# Patient Record
Sex: Male | Born: 1975 | Race: Black or African American | Hispanic: No | Marital: Single | Smoking: Current every day smoker
Health system: Southern US, Community
[De-identification: ages and names within clinical notes are randomized; demographics above are authoritative.]

## PROBLEM LIST (undated history)

## (undated) DIAGNOSIS — K2970 Gastritis, unspecified, without bleeding: Secondary | ICD-10-CM

## (undated) DIAGNOSIS — K029 Dental caries, unspecified: Secondary | ICD-10-CM

## (undated) DIAGNOSIS — B1920 Unspecified viral hepatitis C without hepatic coma: Secondary | ICD-10-CM

## (undated) DIAGNOSIS — L309 Dermatitis, unspecified: Secondary | ICD-10-CM

## (undated) DIAGNOSIS — S43006A Unspecified dislocation of unspecified shoulder joint, initial encounter: Secondary | ICD-10-CM

---

## 2010-11-09 ENCOUNTER — Emergency Department (HOSPITAL_COMMUNITY): Admission: EM | Admit: 2010-11-09 | Discharge: 2010-11-09 | Payer: Self-pay | Admitting: Emergency Medicine

## 2011-05-10 ENCOUNTER — Emergency Department (HOSPITAL_COMMUNITY)
Admission: EM | Admit: 2011-05-10 | Discharge: 2011-05-11 | Disposition: A | Discharge status: Home / Self Care | Payer: Self-pay | Attending: Emergency Medicine | Admitting: Emergency Medicine

## 2011-05-10 DIAGNOSIS — R112 Nausea with vomiting, unspecified: Secondary | ICD-10-CM | POA: Insufficient documentation

## 2011-05-10 DIAGNOSIS — F17200 Nicotine dependence, unspecified, uncomplicated: Secondary | ICD-10-CM | POA: Insufficient documentation

## 2011-05-10 DIAGNOSIS — R109 Unspecified abdominal pain: Secondary | ICD-10-CM | POA: Insufficient documentation

## 2011-05-11 LAB — CBC
Platelets: 319 10*3/uL (ref 150–400)
RBC: 5.37 MIL/uL (ref 4.22–5.81)
RDW: 13.8 % (ref 11.5–15.5)
WBC: 10.9 10*3/uL — ABNORMAL HIGH (ref 4.0–10.5)

## 2011-05-11 LAB — DIFFERENTIAL
Basophils Absolute: 0 10*3/uL (ref 0.0–0.1)
Eosinophils Absolute: 0.1 10*3/uL (ref 0.0–0.7)
Eosinophils Relative: 1 % (ref 0–5)
Neutrophils Relative %: 72 % (ref 43–77)

## 2011-05-11 LAB — URINALYSIS, ROUTINE W REFLEX MICROSCOPIC
Glucose, UA: NEGATIVE mg/dL
Hgb urine dipstick: NEGATIVE
pH: 6.5 (ref 5.0–8.0)

## 2011-05-11 LAB — COMPREHENSIVE METABOLIC PANEL
ALT: 10 U/L (ref 0–53)
AST: 15 U/L (ref 0–37)
Albumin: 4.4 g/dL (ref 3.5–5.2)
Alkaline Phosphatase: 58 U/L (ref 39–117)
BUN: 17 mg/dL (ref 6–23)
GFR calc Af Amer: >60 mL/min (ref >60)
Potassium: 3.3 mEq/L — ABNORMAL LOW (ref 3.5–5.1)
Sodium: 138 mEq/L (ref 135–145)
Total Protein: 8.6 g/dL — ABNORMAL HIGH (ref 6.0–8.3)

## 2011-05-11 LAB — URINE MICROSCOPIC-ADD ON

## 2011-05-12 LAB — URINE CULTURE: Colony Count: NO GROWTH

## 2011-08-13 ENCOUNTER — Emergency Department (HOSPITAL_COMMUNITY)
Admission: EM | Admit: 2011-08-13 | Discharge: 2011-08-13 | Disposition: A | Discharge status: Home / Self Care | Payer: Medicaid Other | Attending: Emergency Medicine | Admitting: Emergency Medicine

## 2011-08-13 DIAGNOSIS — T23019A Burn of unspecified degree of unspecified thumb (nail), initial encounter: Secondary | ICD-10-CM | POA: Insufficient documentation

## 2011-08-13 DIAGNOSIS — M79609 Pain in unspecified limb: Secondary | ICD-10-CM | POA: Insufficient documentation

## 2011-08-13 DIAGNOSIS — X19XXXA Contact with other heat and hot substances, initial encounter: Secondary | ICD-10-CM | POA: Insufficient documentation

## 2011-08-14 ENCOUNTER — Emergency Department (HOSPITAL_COMMUNITY)
Admission: EM | Admit: 2011-08-14 | Discharge: 2011-08-14 | Disposition: A | Discharge status: Home / Self Care | Payer: Medicaid Other | Attending: Emergency Medicine | Admitting: Emergency Medicine

## 2011-08-14 DIAGNOSIS — L089 Local infection of the skin and subcutaneous tissue, unspecified: Secondary | ICD-10-CM | POA: Insufficient documentation

## 2011-08-14 DIAGNOSIS — X19XXXA Contact with other heat and hot substances, initial encounter: Secondary | ICD-10-CM | POA: Insufficient documentation

## 2011-08-14 DIAGNOSIS — F17200 Nicotine dependence, unspecified, uncomplicated: Secondary | ICD-10-CM | POA: Insufficient documentation

## 2011-08-14 DIAGNOSIS — T23119A Burn of first degree of unspecified thumb (nail), initial encounter: Secondary | ICD-10-CM | POA: Insufficient documentation

## 2011-11-20 ENCOUNTER — Encounter: Payer: Self-pay | Admitting: Emergency Medicine

## 2011-11-20 ENCOUNTER — Emergency Department (HOSPITAL_COMMUNITY): Payer: Medicaid Other

## 2011-11-20 ENCOUNTER — Emergency Department (HOSPITAL_COMMUNITY)
Admission: EM | Admit: 2011-11-20 | Discharge: 2011-11-20 | Disposition: A | Discharge status: Home / Self Care | Payer: Medicaid Other | Attending: Emergency Medicine | Admitting: Emergency Medicine

## 2011-11-20 DIAGNOSIS — R112 Nausea with vomiting, unspecified: Secondary | ICD-10-CM | POA: Insufficient documentation

## 2011-11-20 DIAGNOSIS — R10819 Abdominal tenderness, unspecified site: Secondary | ICD-10-CM | POA: Insufficient documentation

## 2011-11-20 DIAGNOSIS — E860 Dehydration: Secondary | ICD-10-CM

## 2011-11-20 DIAGNOSIS — R109 Unspecified abdominal pain: Secondary | ICD-10-CM | POA: Insufficient documentation

## 2011-11-20 LAB — LACTIC ACID, PLASMA: Lactic Acid, Venous: 0.8 mmol/L (ref 0.5–2.2)

## 2011-11-20 LAB — CBC
Hemoglobin: 13.7 g/dL (ref 13.0–17.0)
MCH: 26.2 pg (ref 26.0–34.0)
MCV: 78.7 fL (ref 78.0–100.0)
Platelets: 310 10*3/uL (ref 150–400)
RBC: 5.22 MIL/uL (ref 4.22–5.81)

## 2011-11-20 LAB — DIFFERENTIAL
Eosinophils Absolute: 0.1 10*3/uL (ref 0.0–0.7)
Eosinophils Relative: 1 % (ref 0–5)
Lymphs Abs: 2.4 10*3/uL (ref 0.7–4.0)
Monocytes Relative: 13 % — ABNORMAL HIGH (ref 3–12)

## 2011-11-20 LAB — LIPASE, BLOOD: Lipase: 18 U/L (ref 11–59)

## 2011-11-20 LAB — URINALYSIS, ROUTINE W REFLEX MICROSCOPIC
Nitrite: NEGATIVE
Specific Gravity, Urine: 1.029 (ref 1.005–1.030)
Urobilinogen, UA: 0.2 mg/dL (ref 0.0–1.0)

## 2011-11-20 LAB — COMPREHENSIVE METABOLIC PANEL
BUN: 24 mg/dL — ABNORMAL HIGH (ref 6–23)
Calcium: 9.3 mg/dL (ref 8.4–10.5)
Creatinine, Ser: 1.27 mg/dL (ref 0.50–1.35)
GFR calc Af Amer: 84 mL/min — ABNORMAL LOW (ref >90)
Glucose, Bld: 109 mg/dL — ABNORMAL HIGH (ref 70–99)
Total Protein: 7.8 g/dL (ref 6.0–8.3)

## 2011-11-20 LAB — URINE MICROSCOPIC-ADD ON

## 2011-11-20 LAB — POCT I-STAT TROPONIN I: Troponin i, poc: 0.01 ng/mL (ref 0.00–0.08)

## 2011-11-20 MED ORDER — ONDANSETRON HCL 4 MG PO TABS: 4.0000 mg | ORAL_TABLET | Freq: Four times a day (QID) | ORAL | Status: AC

## 2011-11-20 MED ORDER — MORPHINE SULFATE 4 MG/ML IJ SOLN
4.0000 mg | Freq: Once | INTRAMUSCULAR | Status: AC
  Administered 2011-11-20: 4 mg via INTRAVENOUS

## 2011-11-20 MED ORDER — PROMETHAZINE HCL 25 MG PO TABS: 25.0000 mg | ORAL_TABLET | Freq: Four times a day (QID) | ORAL | Status: DC | PRN

## 2011-11-20 MED ORDER — HYDROCODONE-ACETAMINOPHEN 5-500 MG PO TABS: 2.0000 | ORAL_TABLET | Freq: Four times a day (QID) | ORAL | Status: AC | PRN

## 2011-11-20 MED ORDER — POTASSIUM CHLORIDE CRYS ER 20 MEQ PO TBCR
40.0000 meq | EXTENDED_RELEASE_TABLET | Freq: Once | ORAL | Status: AC
  Administered 2011-11-20: 40 meq via ORAL
  Filled 2011-11-20: qty 2

## 2011-11-20 MED ORDER — SODIUM CHLORIDE 0.9 % IV BOLUS (SEPSIS): 2000.0000 mL | Freq: Once | INTRAVENOUS | Status: DC

## 2011-11-20 MED ORDER — ONDANSETRON HCL 4 MG/2ML IJ SOLN
4.0000 mg | Freq: Once | INTRAMUSCULAR | Status: AC
  Administered 2011-11-20: 4 mg via INTRAVENOUS
  Filled 2011-11-20: qty 2

## 2011-11-20 MED ORDER — ONDANSETRON HCL 4 MG/2ML IJ SOLN: 4.0000 mg | Freq: Once | INTRAMUSCULAR | Status: DC

## 2011-11-20 MED ORDER — MORPHINE SULFATE 2 MG/ML IJ SOLN
INTRAMUSCULAR | Status: AC
  Administered 2011-11-20: 4 mg via INTRAVENOUS
  Filled 2011-11-20: qty 2

## 2011-11-20 MED ORDER — SODIUM CHLORIDE 0.9 % IV BOLUS (SEPSIS): 1000.0000 mL | Freq: Once | INTRAVENOUS | Status: DC

## 2011-11-20 NOTE — ED Notes (Signed)
Pt stated that he has been n/v for 3days

## 2011-11-20 NOTE — ED Notes (Signed)
JXB:[id number]<GN> Expected date:11/20/11<BR> Expected time: 8:11 AM<BR> Means of arrival:Ambulance<BR> Comments:<BR> N/V X1 week

## 2011-11-20 NOTE — ED Notes (Signed)
Patient is resting comfortably. And Pt is aware that a urine sample is needed.

## 2011-11-20 NOTE — ED Provider Notes (Signed)
History     CSN: [id number] Arrival date & time: 11/20/2011  8:28 AM   First MD Initiated Contact with Patient 11/20/11 [phone]      Chief Complaint  Patient presents with  . Nausea  . Emesis    (Consider location/radiation/quality/duration/timing/severity/associated sxs/prior treatment) HPI Comments: Epigastric and left upper quadrant tenderness associated with his vomiting. The vomiting began prior to the abdominal symptoms. Hasn't had a bowel movement over a week. There is no hematemesis. Has some substernal chest burning after vomiting.  No sob.  No known ill contacts  Patient is a 35 y.o. male presenting with vomiting. The history is provided by the patient. No language interpreter was used.  Emesis  This is a new problem. The current episode started more than 2 days ago (1 week ago). The problem occurs 2 to 4 times per day. The problem has been gradually worsening. The emesis has an appearance of stomach contents. There has been no fever. Associated symptoms include abdominal pain. Pertinent negatives include no cough, no diarrhea, no fever and no headaches.    No past medical history on file.  No past surgical history on file.  No family history on file.  History  Substance Use Topics  . Smoking status: Not on file  . Smokeless tobacco: Not on file  . Alcohol Use: Not on file      Review of Systems  Constitutional: Negative for fever, activity change, appetite change and fatigue.  HENT: Negative for congestion, sore throat, rhinorrhea, neck pain and neck stiffness.   Respiratory: Negative for cough, shortness of breath and wheezing.   Cardiovascular: Negative for chest pain and palpitations.  Gastrointestinal: Positive for nausea, vomiting, abdominal pain and constipation. Negative for diarrhea and blood in stool.  Genitourinary: Negative for dysuria, urgency, frequency, flank pain and testicular pain.  Neurological: Negative for dizziness, weakness, light-headedness,  numbness and headaches.  All other systems reviewed and are negative.    Allergies  Review of patient's allergies indicates no known allergies.  Home Medications   Current Outpatient Rx  Name Route Sig Dispense Refill  . CYCLOBENZAPRINE HCL 10 MG PO TABS Oral Take 10 mg by mouth at bedtime.      . IBUPROFEN 800 MG PO TABS Oral Take 800 mg by mouth 3 (three) times daily.      Marland Kitchen HYDROCODONE-ACETAMINOPHEN 5-500 MG PO TABS Oral Take 2 tablets by mouth every 6 (six) hours as needed for pain.  10 tablet 0  . ONDANSETRON HCL 4 MG PO TABS Oral Take 1 tablet (4 mg total) by mouth every 6 (six) hours. 12 tablet 0  . PROMETHAZINE HCL 25 MG PO TABS Oral Take 1 tablet (25 mg total) by mouth every 6 (six) hours as needed for nausea. 20 tablet 0    BP 117/73  Pulse 72  Temp(Src) 97.6 F (36.4 C) (Oral)  Resp 16  Ht 6\' 4"  (1.93 m)  Wt 198 lb (89.812 kg)  BMI 24.10 kg/m2  SpO2 99%  Physical Exam  Nursing note and vitals reviewed. Constitutional: He is oriented to person, place, and time. He appears well-developed and well-nourished. No distress.  HENT:  Head: Normocephalic and atraumatic.  Mouth/Throat: Oropharynx is clear and moist.  Eyes: Conjunctivae and EOM are normal. Pupils are equal, round, and reactive to light.  Neck: Normal range of motion. Neck supple.  Cardiovascular: Normal rate, regular rhythm, normal heart sounds and intact distal pulses.  Exam reveals no gallop and no friction rub.  No murmur heard. Pulmonary/Chest: Effort normal and breath sounds normal. No respiratory distress.  Abdominal: Soft. Bowel sounds are normal. He exhibits no distension. There is tenderness (mild diffuse abdominal pain). There is no rebound and no guarding.  Musculoskeletal: Normal range of motion. He exhibits no tenderness.  Neurological: He is alert and oriented to person, place, and time.  Skin: Skin is warm and dry. No rash noted.    ED Course  Procedures (including critical care  time)   Date: 11/20/2011  Rate: 81  Rhythm: normal sinus rhythm  QRS Axis: normal  Intervals: normal  ST/T Wave abnormalities: nonspecific T wave changes  Conduction Disutrbances:none  Narrative Interpretation:   Old EKG Reviewed: none available  Labs Reviewed  DIFFERENTIAL - Abnormal; Notable for the following:    Monocytes Relative 13 (*)    Monocytes Absolute 1.4 (*)    All other components within normal limits  COMPREHENSIVE METABOLIC PANEL - Abnormal; Notable for the following:    Potassium 3.4 (*)    Glucose, Bld 109 (*)    BUN 24 (*)    GFR calc non Af Amer 72 (*)    GFR calc Af Amer 84 (*)    All other components within normal limits  URINALYSIS, ROUTINE W REFLEX MICROSCOPIC - Abnormal; Notable for the following:    Ketones, ur 40 (*)    Protein, ur 30 (*)    All other components within normal limits  CBC  LIPASE, BLOOD  LACTIC ACID, PLASMA  POCT I-STAT TROPONIN I  URINE MICROSCOPIC-ADD ON  I-STAT TROPONIN I   Dg Abd Acute W/chest  11/20/2011  *RADIOLOGY REPORT*  Clinical Data: Abdominal pain, nausea, vomiting  ACUTE ABDOMEN SERIES (ABDOMEN 2 VIEW & CHEST 1 VIEW)  Comparison: None.  Findings: Normal heart size and vascularity.  No focal pneumonia, collapse, consolidation, edema, effusion or pneumothorax.  Trachea midline.  Scattered air and stool throughout the bowel.  No obstruction or ileus.  No free air evident.  Left hemi pelvis calcification likely venous phlebolith.  IMPRESSION: No acute finding by plain radiography  Original Report Authenticated By: Judie Petit. Ruel Favors, M.D.     1. Nausea and vomiting   2. Dehydration       MDM  Dehydration a urinalysis. Potassium was replaced. The remainder of his laboratory studies and imaging was relatively unremarkable. His abdominal pain is mild there is no indication for CT scan imaging at this time. Symptoms improved after IV fluids antiemetics and pain medication. He'll be discharged home with antiemetics as well as  pain medication. He is instructed to followup with her primary care physician next week.  I explained that this could be an ongoing issue however should resolve in a few days. I encouraged aggressive oral rehydration at home.  Provided signs and sx for which to return        Dayton Bailiff, MD 11/20/11 [phone]

## 2011-11-23 ENCOUNTER — Emergency Department (HOSPITAL_COMMUNITY)
Admission: EM | Admit: 2011-11-23 | Discharge: 2011-11-24 | Disposition: A | Discharge status: Home / Self Care | Payer: Medicaid Other | Attending: Emergency Medicine | Admitting: Emergency Medicine

## 2011-11-23 ENCOUNTER — Encounter (HOSPITAL_COMMUNITY): Payer: Self-pay | Admitting: *Deleted

## 2011-11-23 DIAGNOSIS — K219 Gastro-esophageal reflux disease without esophagitis: Secondary | ICD-10-CM | POA: Insufficient documentation

## 2011-11-23 DIAGNOSIS — Z8619 Personal history of other infectious and parasitic diseases: Secondary | ICD-10-CM | POA: Insufficient documentation

## 2011-11-23 DIAGNOSIS — E876 Hypokalemia: Secondary | ICD-10-CM | POA: Insufficient documentation

## 2011-11-23 DIAGNOSIS — R1013 Epigastric pain: Secondary | ICD-10-CM

## 2011-11-23 DIAGNOSIS — R1110 Vomiting, unspecified: Secondary | ICD-10-CM

## 2011-11-23 HISTORY — DX: Unspecified dislocation of unspecified shoulder joint, initial encounter: S43.006A

## 2011-11-23 HISTORY — DX: Unspecified viral hepatitis C without hepatic coma: B19.20

## 2011-11-23 HISTORY — DX: Gastritis, unspecified, without bleeding: K29.70

## 2011-11-23 HISTORY — DX: Dermatitis, unspecified: L30.9

## 2011-11-23 LAB — URINALYSIS, ROUTINE W REFLEX MICROSCOPIC
Glucose, UA: NEGATIVE mg/dL
Hgb urine dipstick: NEGATIVE
Ketones, ur: 40 mg/dL — AB
Leukocytes, UA: NEGATIVE
Nitrite: NEGATIVE
Protein, ur: 30 mg/dL — AB
Specific Gravity, Urine: 1.034 — ABNORMAL HIGH (ref 1.005–1.030)
Urobilinogen, UA: 1 mg/dL (ref 0.0–1.0)
pH: 7 (ref 5.0–8.0)

## 2011-11-23 LAB — POCT I-STAT, CHEM 8
BUN: 27 mg/dL — ABNORMAL HIGH (ref 6–23)
Calcium, Ion: 1.07 mmol/L — ABNORMAL LOW (ref 1.12–1.32)
HCT: 44 % (ref 39.0–52.0)
Hemoglobin: 15 g/dL (ref 13.0–17.0)
Sodium: 137 mEq/L (ref 135–145)
TCO2: 28 mmol/L (ref 0–100)

## 2011-11-23 LAB — URINE MICROSCOPIC-ADD ON

## 2011-11-23 MED ORDER — MORPHINE SULFATE 2 MG/ML IJ SOLN
INTRAMUSCULAR | Status: AC
  Administered 2011-11-23: 4 mg via INTRAVENOUS
  Filled 2011-11-23: qty 2

## 2011-11-23 MED ORDER — MORPHINE SULFATE 4 MG/ML IJ SOLN: 4.0000 mg | Freq: Once | INTRAMUSCULAR | Status: AC

## 2011-11-23 MED ORDER — PANTOPRAZOLE SODIUM 20 MG PO TBEC: 40.0000 mg | DELAYED_RELEASE_TABLET | Freq: Every day | ORAL | Status: DC

## 2011-11-23 MED ORDER — PROMETHAZINE HCL 25 MG RE SUPP: 25.0000 mg | Freq: Four times a day (QID) | RECTAL | Status: DC | PRN

## 2011-11-23 MED ORDER — POTASSIUM CHLORIDE CRYS ER 20 MEQ PO TBCR
20.0000 meq | EXTENDED_RELEASE_TABLET | Freq: Once | ORAL | Status: AC
  Administered 2011-11-24: 20 meq via ORAL
  Filled 2011-11-23: qty 1

## 2011-11-23 MED ORDER — OXYCODONE-ACETAMINOPHEN 5-325 MG PO TABS: 1.0000 | ORAL_TABLET | ORAL | Status: AC | PRN

## 2011-11-23 MED ORDER — SODIUM CHLORIDE 0.9 % IV BOLUS (SEPSIS)
1000.0000 mL | Freq: Once | INTRAVENOUS | Status: AC
  Administered 2011-11-23: 1000 mL via INTRAVENOUS

## 2011-11-23 MED ORDER — ONDANSETRON HCL 4 MG/2ML IJ SOLN
4.0000 mg | Freq: Once | INTRAMUSCULAR | Status: AC
  Administered 2011-11-23: 4 mg via INTRAVENOUS
  Filled 2011-11-23: qty 2

## 2011-11-23 MED ORDER — PANTOPRAZOLE SODIUM 40 MG IV SOLR
40.0000 mg | Freq: Once | INTRAVENOUS | Status: AC
  Administered 2011-11-24: 40 mg via INTRAVENOUS
  Filled 2011-11-23 (×2): qty 40

## 2011-11-23 NOTE — ED Provider Notes (Signed)
Pt well appearing Denies CP/SOB abd soft, nontender  Joya Gaskins, MD 11/23/11 2114

## 2011-11-23 NOTE — ED Notes (Signed)
Pt reports that he states that he came to the ER for nausea and vomiting, was prescribed vicodin, zofran, phenergan. States that he started vomiting again on Sunday. Came to the ER tonight for further evaluation. Pt does have a history of Hepatitis C but does not have a PCP. Pt reports being weak and nauseated at this time. Pt denies abdominal pain at this time.

## 2011-11-23 NOTE — ED Provider Notes (Signed)
History     CSN: [id number] Arrival date & time: 11/23/2011  7:18 PM   First MD Initiated Contact with Patient 11/23/11 2015      Chief Complaint  Patient presents with  . Emesis   HPI: Patient is a 35 y.o. male presenting with vomiting. The history is provided by the patient.  Emesis  This is a recurrent problem. The current episode started more than 2 days ago. The problem occurs 5 to 10 times per day. The problem has not changed since onset.The emesis has an appearance of stomach contents. There has been no fever. Associated symptoms include abdominal pain. Pertinent negatives include no cough and no diarrhea. Risk factors include ill contacts.  Patient reports persistent nausea and vomiting and abdominal pain times approximately one week. Was seen here Sunday, 11/20/2011 for same symptoms. Workup was without acute findings. Discharged home with medication for pain and nausea. Admits to approximately 20 hours of no vomiting. Vomiting then returned and has persisted. Has been unable to keep down food, fluid or medication.  Past Medical History  Diagnosis Date  . Hepatitis C   . Gastritis   . Shoulder dislocation   . Eczema     History reviewed. No pertinent past surgical history.  Family History  Problem Relation Age of Onset  . Rheum arthritis Mother   . Osteoarthritis Mother   . Hypertension Father   . Migraines Sister     History  Substance Use Topics  . Smoking status: Current Everyday Smoker -- 3.0 packs/day for 15 years    Types: Cigarettes  . Smokeless tobacco: Former Neurosurgeon    Quit date: 11/22/2009  . Alcohol Use: Yes     a beer a month ago      Review of Systems  Constitutional: Negative.   HENT: Negative.   Eyes: Negative.   Respiratory: Negative.  Negative for cough.   Cardiovascular: Negative.   Gastrointestinal: Positive for vomiting and abdominal pain. Negative for diarrhea.  Genitourinary: Negative.   Musculoskeletal: Negative.   Skin: Negative.     Neurological: Negative.   Hematological: Negative.   Psychiatric/Behavioral: Negative.     Allergies  Review of patient's allergies indicates no known allergies.  Home Medications   Current Outpatient Rx  Name Route Sig Dispense Refill  . CYCLOBENZAPRINE HCL 10 MG PO TABS Oral Take 10 mg by mouth at bedtime.      Marland Kitchen HYDROCODONE-ACETAMINOPHEN 5-500 MG PO TABS Oral Take 2 tablets by mouth every 6 (six) hours as needed for pain.  10 tablet 0  . IBUPROFEN 800 MG PO TABS Oral Take 800 mg by mouth 3 (three) times daily.      Marland Kitchen ONDANSETRON HCL 4 MG PO TABS Oral Take 1 tablet (4 mg total) by mouth every 6 (six) hours. 12 tablet 0  . PROMETHAZINE HCL 25 MG PO TABS Oral Take 1 tablet (25 mg total) by mouth every 6 (six) hours as needed for nausea. 20 tablet 0    BP 101/74  Pulse 91  Temp(Src) 97.9 F (36.6 C) (Oral)  Resp 18  SpO2 100%  Physical Exam  Constitutional: He is oriented to person, place, and time. He appears well-developed and well-nourished.  HENT:  Head: Normocephalic and atraumatic.  Eyes: Conjunctivae are normal.  Neck: Neck supple.  Cardiovascular: Normal rate and regular rhythm.   Pulmonary/Chest: Effort normal and breath sounds normal.  Abdominal: Soft. Bowel sounds are normal.    Neurological: He is alert and oriented to person,  place, and time.  Skin: Skin is warm and dry.  Psychiatric: He has a normal mood and affect.    ED Course  Procedures Pt reports much improved after IVF's and medication for pain and nausea. No further vomiting. Will give IV Protonix and potassium prior to d/c. Will plan for d/c home w/ Phenergan suppositories and short course of medication for pain. Will start pt on PPI for relux like symptoms and encourage continued efforts at getting established w/ a PCP. Pt agreeable w/ plan.   Labs Reviewed  I-STAT, CHEM 8  URINALYSIS, ROUTINE W REFLEX MICROSCOPIC   No results found.   No diagnosis found.    MDM  HPI, PE and findings  c/w reflux.        Leanne Chang, NP 11/23/11 [phone]

## 2011-11-23 NOTE — ED Provider Notes (Signed)
Medical screening examination/treatment/procedure(s) were conducted as a shared visit with non-physician practitioner(s) and myself.  I personally evaluated the patient during the encounter   Joya Gaskins, MD 11/23/11 [phone]

## 2011-11-24 NOTE — ED Notes (Signed)
Vital signs stable. 

## 2012-01-31 ENCOUNTER — Encounter (HOSPITAL_COMMUNITY): Payer: Self-pay | Admitting: *Deleted

## 2012-01-31 ENCOUNTER — Emergency Department (HOSPITAL_COMMUNITY): Payer: Self-pay

## 2012-01-31 ENCOUNTER — Emergency Department (HOSPITAL_COMMUNITY)
Admission: EM | Admit: 2012-01-31 | Discharge: 2012-01-31 | Disposition: A | Discharge status: Home / Self Care | Payer: Self-pay | Attending: Emergency Medicine | Admitting: Emergency Medicine

## 2012-01-31 DIAGNOSIS — Z8619 Personal history of other infectious and parasitic diseases: Secondary | ICD-10-CM | POA: Insufficient documentation

## 2012-01-31 DIAGNOSIS — M25519 Pain in unspecified shoulder: Secondary | ICD-10-CM | POA: Insufficient documentation

## 2012-01-31 DIAGNOSIS — M2669 Other specified disorders of temporomandibular joint: Secondary | ICD-10-CM | POA: Insufficient documentation

## 2012-01-31 DIAGNOSIS — R062 Wheezing: Secondary | ICD-10-CM | POA: Insufficient documentation

## 2012-01-31 DIAGNOSIS — R6884 Jaw pain: Secondary | ICD-10-CM | POA: Insufficient documentation

## 2012-01-31 MED ORDER — HYDROCODONE-ACETAMINOPHEN 5-325 MG PO TABS: 1.0000 | ORAL_TABLET | Freq: Four times a day (QID) | ORAL | Status: AC | PRN

## 2012-01-31 MED ORDER — IBUPROFEN 800 MG PO TABS: 800.0000 mg | ORAL_TABLET | Freq: Three times a day (TID) | ORAL | Status: AC | PRN
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
## 2012-01-31 NOTE — ED Provider Notes (Signed)
Medical screening examination/treatment/procedure(s) were performed by non-physician practitioner and as supervising physician I was immediately available for consultation/collaboration. Staphanie Harbison, MD, FACEP   Liyana Suniga L Kerstin Crusoe, MD 01/31/12 1855 
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
## 2012-01-31 NOTE — ED Notes (Signed)
Patient transported to X-ray 

## 2014-06-12 ENCOUNTER — Emergency Department (HOSPITAL_COMMUNITY)
Admission: EM | Admit: 2014-06-12 | Discharge: 2014-06-12 | Disposition: A | Discharge status: Home / Self Care | Payer: Medicaid Other | Attending: Emergency Medicine | Admitting: Emergency Medicine

## 2014-06-12 ENCOUNTER — Encounter (HOSPITAL_COMMUNITY): Payer: Self-pay | Admitting: Emergency Medicine

## 2014-06-12 DIAGNOSIS — K007 Teething syndrome: Secondary | ICD-10-CM | POA: Insufficient documentation

## 2014-06-12 DIAGNOSIS — Z8619 Personal history of other infectious and parasitic diseases: Secondary | ICD-10-CM | POA: Insufficient documentation

## 2014-06-12 DIAGNOSIS — F17200 Nicotine dependence, unspecified, uncomplicated: Secondary | ICD-10-CM | POA: Insufficient documentation

## 2014-06-12 DIAGNOSIS — R51 Headache: Secondary | ICD-10-CM | POA: Insufficient documentation

## 2014-06-12 DIAGNOSIS — K029 Dental caries, unspecified: Secondary | ICD-10-CM | POA: Insufficient documentation

## 2014-06-12 DIAGNOSIS — Z872 Personal history of diseases of the skin and subcutaneous tissue: Secondary | ICD-10-CM | POA: Insufficient documentation

## 2014-06-12 DIAGNOSIS — Z87828 Personal history of other (healed) physical injury and trauma: Secondary | ICD-10-CM | POA: Insufficient documentation

## 2014-06-12 DIAGNOSIS — K089 Disorder of teeth and supporting structures, unspecified: Secondary | ICD-10-CM | POA: Insufficient documentation

## 2014-06-12 DIAGNOSIS — K0381 Cracked tooth: Secondary | ICD-10-CM | POA: Insufficient documentation

## 2014-06-12 DIAGNOSIS — K0889 Other specified disorders of teeth and supporting structures: Secondary | ICD-10-CM

## 2014-06-12 MED ORDER — HYDROCODONE-ACETAMINOPHEN 5-325 MG PO TABS: 1.0000 | ORAL_TABLET | ORAL | Status: DC | PRN

## 2014-06-12 MED ORDER — IBUPROFEN 800 MG PO TABS: 800.0000 mg | ORAL_TABLET | Freq: Three times a day (TID) | ORAL | Status: DC

## 2014-06-12 MED ORDER — PENICILLIN V POTASSIUM 500 MG PO TABS: 500.0000 mg | ORAL_TABLET | Freq: Four times a day (QID) | ORAL | Status: DC

## 2014-06-12 NOTE — ED Provider Notes (Signed)
Medical screening examination/treatment/procedure(s) were performed by non-physician practitioner and as supervising physician I was immediately available for consultation/collaboration.   EKG Interpretation None        Glynn OctaveStephen Rancour, MD 06/12/14 [phone]

## 2014-06-12 NOTE — Discharge Instructions (Signed)
You have a dental injury. Use the resource guide listed below to help you find a dentist if you do not already have one to followup with. It is very important that you get evaluated by a dentist as soon as possible. Call tomorrow to schedule an appointment. Use your pain medication as prescribed and do not operate heavy machinery while on pain medication. Note that your pain medication contains acetaminophen (Tylenol) & its is not reccommended that you use additional acetaminophen (Tylenol) while taking this medication. Take your full course of antibiotics. Read the instructions below.  Eat a soft or liquid diet and rinse your mouth out after meals with warm water. You should see a dentist or return here at once if you have increased swelling, increased pain or uncontrolled bleeding from the site of your injury.   SEEK MEDICAL CARE IF:   You have increased pain not controlled with medicines.   You have swelling around your tooth, in your face or neck.   You have bleeding which starts, continues, or gets worse.   You have a fever >101  If you are unable to open your mouth  RESOURCE GUIDE  Dental Problems  Patients with Medicaid: [facility]                     [facility] [address]                                           [address] Phone:  [phone]                                                  Phone:  [phone]  If unable to pay or uninsured, contact:  [facility] or [facility] to become qualified for the adult dental clinic.  Chronic Pain Problems Contact [facility]  [phone] Patients need to be referred by their primary care doctor.  Insufficient Money for Medicine Contact [employer]:  call "211" or [facility] 271-5999.  No Primary Care Doctor Call [facility]  832-8000 Other agencies that provide inexpensive medical care    [facility]  832-8035    [facility]  832-7272    [facility]  [phone]    Women[facility] Clinic  832-4777    [facility]  [phone]    [facility]  [phone]  Psychological Services [facility]  [phone] [facility]  [phone] [facility]   800 853-5163 (emergency services 641-4993)  Substance Abuse Resources [facility]  336-882-2125 [facility] 336-784-9470 [facility] 336-285-9073 [facility] 336-845-3988 Residential & Outpatient Substance Abuse Program  800-659-3381  Abuse/Neglect [place] Child Abuse Hotline (336) 641-3795 [place] Child Abuse Hotline 800-378-5315 (After Hours)  Emergency Shelter  [facility] (336) 271-5985  [facility] (336) 275-9566 [facility] (704) 372-4663  MRSA Hotline #:   832-7006    [place] Resources  [facility] [place]     [employer]                            [facility] [address] [place]                       [address]      [address]  [place]                                                [place]                            [place] Phone:  [phone]                                   Phone:  [phone]                 Phone:  [phone]  [facility] Phone:  [phone]  [place] Child Abuse Hotline (336) 342-1394 (336) 342-3537 (After Hours)    

## 2014-06-12 NOTE — ED Provider Notes (Signed)
CSN: [id number]     Arrival date & time 06/12/14  0930 History   First MD Initiated Contact with Patient 06/12/14 1003     Chief Complaint  Patient presents with  . Dental Pain     (Consider location/radiation/quality/duration/timing/severity/associated sxs/prior Treatment) Patient is a 38 y.o. male presenting with tooth pain. The history is provided by the patient. No language interpreter was used.  Dental Pain Location:  Lower Lower teeth location:  18/LL 2nd molar and 20/LL 2nd bicuspid Quality:  Sharp, dull and constant Severity:  Moderate Onset quality:  Gradual Duration:  2 days Timing:  Constant Progression:  Worsening Chronicity:  Recurrent Context: dental caries, dental fracture and poor dentition   Context: not abscess, not malocclusion, not recent dental surgery and not trauma   Worsened by:  Touching, jaw movement and cold food/drink Ineffective treatments:  Acetaminophen Associated symptoms: facial pain and headaches   Associated symptoms: no facial swelling, no fever and no neck swelling   Risk factors: lack of dental care and smoking     Past Medical History  Diagnosis Date  . Hepatitis C   . Gastritis   . Shoulder dislocation   . Eczema    No past surgical history on file. Family History  Problem Relation Age of Onset  . Rheum arthritis Mother   . Osteoarthritis Mother   . Hypertension Father   . Migraines Sister    History  Substance Use Topics  . Smoking status: Current Every Day Smoker -- 3.00 packs/day for 15 years    Types: Cigarettes  . Smokeless tobacco: Former NeurosurgeonUser    Quit date: 11/22/2009  . Alcohol Use: Yes     Comment: a beer a month ago    Review of Systems  Constitutional: Negative for fever, chills and diaphoresis.  HENT: Negative for facial swelling and voice change.   Neurological: Positive for headaches.      Allergies  Review of patient's allergies indicates no known allergies.  Home Medications   Prior to Admission  medications   Medication Sig Start Date End Date Taking? Authorizing Provider  ibuprofen (ADVIL,MOTRIN) 800 MG tablet Take 800 mg by mouth 3 (three) times daily as needed. For pain    Historical Provider, MD   BP 141/84  Pulse 87  Temp(Src) 98.1 F (36.7 C) (Oral)  Resp 16  SpO2 97% Physical Exam  Nursing note and vitals reviewed. Constitutional: He is oriented to person, place, and time. He appears well-developed and well-nourished.  Non-toxic appearance. He does not have a sickly appearance. He does not appear ill. No distress.  HENT:  Head: Normocephalic and atraumatic.  Mouth/Throat: Oropharynx is clear and moist. No trismus in the jaw. Abnormal dentition. Dental caries present. No dental abscesses. No oropharyngeal exudate.  Tenderness to palpation of the base of tooth #18, #17 No signs of peritonsillar or tonsillar abscess. Multiple missing and partially missing teeth throughout. No signs of gingival abscess. Oropharynx is clear and without exudates. Soft non-tender sublingual mucosa, no tongue elevation, no edema to sublingual space, normal voice. Airway patent.   Eyes: EOM are normal. Pupils are equal, round, and reactive to light.  Neck: Normal range of motion. Neck supple.  Pulmonary/Chest: Effort normal. No respiratory distress.  Musculoskeletal: Normal range of motion.  Lymphadenopathy:    He has no cervical adenopathy.  Neurological: He is alert and oriented to person, place, and time.  Skin: Skin is warm and dry. He is not diaphoretic.  Psychiatric: He has a normal  mood and affect. His behavior is normal.    ED Course  Procedures (including critical care time)   MDM   Final diagnoses:  Pain, dental   Patient with toothache.  No gross abscess.  Exam unconcerning for Ludwig's angina or spread of infection.  Will treat with penicillin and pain medicine.  Urged patient to follow-up with dentist.    Meds given in ED:  Medications - No data to display  New  Prescriptions   HYDROCODONE-ACETAMINOPHEN (NORCO/VICODIN) 5-325 MG PER TABLET    Take 1 tablet by mouth every 4 (four) hours as needed for moderate pain or severe pain.   IBUPROFEN (ADVIL,MOTRIN) 800 MG TABLET    Take 1 tablet (800 mg total) by mouth 3 (three) times daily with meals.   PENICILLIN V POTASSIUM (VEETID) 500 MG TABLET    Take 1 tablet (500 mg total) by mouth 4 (four) times daily.       Clabe SealLauren M Parker, PA-C 06/12/14 [phone]

## 2014-06-12 NOTE — ED Notes (Signed)
Pt c/o toothache x 2 days 

## 2016-01-20 ENCOUNTER — Encounter (HOSPITAL_COMMUNITY): Payer: Self-pay

## 2016-01-20 ENCOUNTER — Emergency Department (HOSPITAL_COMMUNITY)
Admission: EM | Admit: 2016-01-20 | Discharge: 2016-01-20 | Disposition: A | Discharge status: Home / Self Care | Payer: Medicaid Other | Attending: Emergency Medicine | Admitting: Emergency Medicine

## 2016-01-20 DIAGNOSIS — Z87828 Personal history of other (healed) physical injury and trauma: Secondary | ICD-10-CM | POA: Insufficient documentation

## 2016-01-20 DIAGNOSIS — K002 Abnormalities of size and form of teeth: Secondary | ICD-10-CM | POA: Insufficient documentation

## 2016-01-20 DIAGNOSIS — R1013 Epigastric pain: Secondary | ICD-10-CM

## 2016-01-20 DIAGNOSIS — Z872 Personal history of diseases of the skin and subcutaneous tissue: Secondary | ICD-10-CM | POA: Insufficient documentation

## 2016-01-20 DIAGNOSIS — K219 Gastro-esophageal reflux disease without esophagitis: Secondary | ICD-10-CM

## 2016-01-20 DIAGNOSIS — F17210 Nicotine dependence, cigarettes, uncomplicated: Secondary | ICD-10-CM | POA: Insufficient documentation

## 2016-01-20 DIAGNOSIS — Z79899 Other long term (current) drug therapy: Secondary | ICD-10-CM | POA: Insufficient documentation

## 2016-01-20 DIAGNOSIS — Z791 Long term (current) use of non-steroidal anti-inflammatories (NSAID): Secondary | ICD-10-CM | POA: Insufficient documentation

## 2016-01-20 DIAGNOSIS — K029 Dental caries, unspecified: Secondary | ICD-10-CM

## 2016-01-20 DIAGNOSIS — Z8619 Personal history of other infectious and parasitic diseases: Secondary | ICD-10-CM | POA: Insufficient documentation

## 2016-01-20 HISTORY — DX: Dental caries, unspecified: K02.9

## 2016-01-20 MED ORDER — RANITIDINE HCL 150 MG PO TABS: 150.0000 mg | ORAL_TABLET | Freq: Every day | ORAL | Status: DC

## 2016-01-20 MED ORDER — PENICILLIN V POTASSIUM 500 MG PO TABS: 500.0000 mg | ORAL_TABLET | Freq: Three times a day (TID) | ORAL | Status: DC

## 2016-01-20 MED ORDER — GI COCKTAIL ~~LOC~~
30.0000 mL | Freq: Once | ORAL | Status: AC
  Administered 2016-01-20: 30 mL via ORAL
  Filled 2016-01-20: qty 30

## 2016-01-20 NOTE — ED Notes (Signed)
ED PA at bedside

## 2016-01-20 NOTE — Discharge Instructions (Signed)
Continue to take Zantac and avoid food which may upset your stomach.  Follow up with a dentist for dental pain, take antibiotic in the mean time.  Follow up with hand specialist for further evaluation of your left thumb discomfort.    Gastroesophageal Reflux Disease, Adult Normally, food travels down the esophagus and stays in the stomach to be digested. However, when a person has gastroesophageal reflux disease (GERD), food and stomach acid move back up into the esophagus. When this happens, the esophagus becomes sore and inflamed. Over time, GERD can create small holes (ulcers) in the lining of the esophagus.  CAUSES This condition is caused by a problem with the muscle between the esophagus and the stomach (lower esophageal sphincter, or LES). Normally, the LES muscle closes after food passes through the esophagus to the stomach. When the LES is weakened or abnormal, it does not close properly, and that allows food and stomach acid to go back up into the esophagus. The LES can be weakened by certain dietary substances, medicines, and medical conditions, including:  Tobacco use.  Pregnancy.  Having a hiatal hernia.  Heavy alcohol use.  Certain foods and beverages, such as coffee, chocolate, onions, and peppermint. RISK FACTORS This condition is more likely to develop in:  People who have an increased body weight.  People who have connective tissue disorders.  People who use NSAID medicines. SYMPTOMS Symptoms of this condition include:  Heartburn.  Difficult or painful swallowing.  The feeling of having a lump in the throat.  Abitter taste in the mouth.  Bad breath.  Having a large amount of saliva.  Having an upset or bloated stomach.  Belching.  Chest pain.  Shortness of breath or wheezing.  Ongoing (chronic) cough or a night-time cough.  Wearing away of tooth enamel.  Weight loss. Different conditions can cause chest pain. Make sure to see your health care  provider if you experience chest pain. DIAGNOSIS Your health care provider will take a medical history and perform a physical exam. To determine if you have mild or severe GERD, your health care provider may also monitor how you respond to treatment. You may also have other tests, including:  An endoscopy toexamine your stomach and esophagus with a small camera.  A test thatmeasures the acidity level in your esophagus.  A test thatmeasures how much pressure is on your esophagus.  A barium swallow or modified barium swallow to show the shape, size, and functioning of your esophagus. TREATMENT The goal of treatment is to help relieve your symptoms and to prevent complications. Treatment for this condition may vary depending on how severe your symptoms are. Your health care provider may recommend:  Changes to your diet.  Medicine.  Surgery. HOME CARE INSTRUCTIONS Diet  Follow a diet as recommended by your health care provider. This may involve avoiding foods and drinks such as:  Coffee and tea (with or without caffeine).  Drinks that containalcohol.  Energy drinks and sports drinks.  Carbonated drinks or sodas.  Chocolate and cocoa.  Peppermint and mint flavorings.  Garlic and onions.  Horseradish.  Spicy and acidic foods, including peppers, chili powder, curry powder, vinegar, hot sauces, and barbecue sauce.  Citrus fruit juices and citrus fruits, such as oranges, lemons, and limes.  Tomato-based foods, such as red sauce, chili, salsa, and pizza with red sauce.  Fried and fatty foods, such as donuts, french fries, potato chips, and high-fat dressings.  High-fat meats, such as hot dogs and fatty cuts  of red and white meats, such as rib eye steak, sausage, ham, and bacon.  High-fat dairy items, such as whole milk, butter, and cream cheese.  Eat small, frequent meals instead of large meals.  Avoid drinking large amounts of liquid with your meals.  Avoid eating  meals during the 2-3 hours before bedtime.  Avoid lying down right after you eat.  Do not exercise right after you eat. General Instructions  Pay attention to any changes in your symptoms.  Take over-the-counter and prescription medicines only as told by your health care provider. Do not take aspirin, ibuprofen, or other NSAIDs unless your health care provider told you to do so.  Do not use any tobacco products, including cigarettes, chewing tobacco, and e-cigarettes. If you need help quitting, ask your health care provider.  Wear loose-fitting clothing. Do not wear anything tight around your waist that causes pressure on your abdomen.  Raise (elevate) the head of your bed 6 inches (15cm).  Try to reduce your stress, such as with yoga or meditation. If you need help reducing stress, ask your health care provider.  If you are overweight, reduce your weight to an amount that is healthy for you. Ask your health care provider for guidance about a safe weight loss goal.  Keep all follow-up visits as told by your health care provider. This is important. SEEK MEDICAL CARE IF:  You have new symptoms.  You have unexplained weight loss.  You have difficulty swallowing, or it hurts to swallow.  You have wheezing or a persistent cough.  Your symptoms do not improve with treatment.  You have a hoarse voice. SEEK IMMEDIATE MEDICAL CARE IF:  You have pain in your arms, neck, jaw, teeth, or back.  You feel sweaty, dizzy, or light-headed.  You have chest pain or shortness of breath.  You vomit and your vomit looks like blood or coffee grounds.  You faint.  Your stool is bloody or black.  You cannot swallow, drink, or eat.   This information is not intended to replace advice given to you by your health care provider. Make sure you discuss any questions you have with your health care provider.   Document Released: 09/15/2005 Document Revised: 08/27/2015 Document Reviewed:  04/02/2015 Elsevier Interactive Patient Education [employer]

## 2016-01-20 NOTE — ED Provider Notes (Signed)
CSN: [id number]     Arrival date & time 01/20/16  4098 History   First MD Initiated Contact with Patient 01/20/16 [phone]     Chief Complaint  Patient presents with  . Dental Pain  . Nausea  . Emesis     (Consider location/radiation/quality/duration/timing/severity/associated sxs/prior Treatment) HPI   40 year old male with history of hepatitis C brought here via EMS presenting for evaluation of nausea and vomiting. Patient report he has a significant history of heartburn. Yesterday after eating a hamburger he developed burning sensation from his epigastric that radiates towards his throat. Sensation has been persistent, worsening with eating spicy food. He ran out of his Zantac yesterday. He reports moderate discomfort from his "heartburn" which has now is causing right side dental pain. He is unable to localize the exact tooth that cause pain, and states that the pain radiated throughout his right lower jaw. Patient complaining of chest tightness. He denies having any fever, headache, productive cough, back pain, dysuria, or rash.   Past Medical History  Diagnosis Date  . Hepatitis C   . Gastritis   . Shoulder dislocation   . Eczema    No past surgical history on file. Family History  Problem Relation Age of Onset  . Rheum arthritis Mother   . Osteoarthritis Mother   . Hypertension Father   . Migraines Sister    Social History  Substance Use Topics  . Smoking status: Current Every Day Smoker -- 3.00 packs/day for 15 years    Types: Cigarettes  . Smokeless tobacco: Former Neurosurgeon    Quit date: 11/22/2009  . Alcohol Use: Yes     Comment: a beer a month ago    Review of Systems  All other systems reviewed and are negative.     Allergies  Review of patient's allergies indicates no known allergies.  Home Medications   Prior to Admission medications   Medication Sig Start Date End Date Taking? Authorizing Provider  HYDROcodone-acetaminophen (NORCO/VICODIN) 5-325 MG per  tablet Take 1 tablet by mouth every 4 (four) hours as needed for moderate pain or severe pain. 06/12/14   Mellody Drown, PA-C  ibuprofen (ADVIL,MOTRIN) 800 MG tablet Take 1 tablet (800 mg total) by mouth 3 (three) times daily with meals. 06/12/14   Mellody Drown, PA-C  penicillin v potassium (VEETID) 500 MG tablet Take 1 tablet (500 mg total) by mouth 4 (four) times daily. 06/12/14   Mellody Drown, PA-C  ranitidine (ZANTAC) 150 MG capsule Take 150 mg by mouth daily.    Historical Provider, MD   SpO2  Physical Exam  Constitutional: He appears well-developed and well-nourished. No distress.  African-American male appears uncomfortable but nontoxic  HENT:  Head: Atraumatic.  Mouth: Poor dentition with dental decay noted to tooth #28, and #30, tender to palpation but no gingival erythema or abscess. No trismus. No TMJ.  Eyes: Conjunctivae are normal.  Neck: Neck supple.  Cardiovascular: Normal rate and regular rhythm.   Pulmonary/Chest: Effort normal and breath sounds normal. He exhibits no tenderness.  Abdominal: Soft. Bowel sounds are normal. He exhibits no distension. There is no tenderness.  Neurological: He is alert.  Skin: No rash noted.  Psychiatric: He has a normal mood and affect.  Nursing note and vitals reviewed.   ED Course  Procedures (including critical care time) Labs Review Labs Reviewed - No data to display  Imaging Review No results found. I have personally reviewed and evaluated these images and lab results as part of my medical  decision-making.   EKG Interpretation None      MDM   Final diagnoses:  Epigastric pain  Gastroesophageal reflux disease, esophagitis presence not specified  Pain due to dental caries    BP 125/81 mmHg  Pulse 64  Temp(Src) 97.7 F (36.5 C) (Oral)  Resp 20  Ht  (1.93 m)  Wt 88.451 kg  BMI 23.75 kg/m2  SpO2 100%   7:34 AM patient with history of GERD here with symptoms suggestive of GERD. Does report dental tenderness  secondary to his current GERD sxs.  Pt will benefit from a GI cocktail.  He will receive a dental referral for his dental pain.    8:20 AM After receiving a GI cocktail, patient felt much better. He is now able to tolerates by mouth. His dental pain also improves. At this time patient request for me to evaluate his left thumb stating that he has had trouble with his joint locking in a flexed position for the past week. Patient states he did hit something against it but is not having significant pain at that time. He denies any acute pain to his thumb or any numbness. He is right-hand dominant. On examination, patient has no focal point tenderness however after he flexes IP joint he is having difficulty straightening it. No crepitus on exam. Normal brisk cap refills. Patient will receive a referral to hand specialist for management.   Fayrene Helper, PA-C 01/20/16 0827  Mancel Bale, MD 01/20/16 2100

## 2016-01-20 NOTE — ED Notes (Signed)
Bed: [facility] Expected date:  Expected time:  Means of arrival:  Comments: EMS 40yo M dental pain

## 2016-01-20 NOTE — ED Notes (Signed)
EDPA BOWIE at bedside. 

## 2016-01-20 NOTE — ED Notes (Signed)
Per [facility]- Pt c/o of dental pain left side x 2 months recent increase over the last 2 days with N/V. Has not vomited today. HX of GERD. Out of zantac. No other complaints

## 2016-01-21 ENCOUNTER — Emergency Department (HOSPITAL_COMMUNITY)
Admission: EM | Admit: 2016-01-21 | Discharge: 2016-01-21 | Disposition: A | Discharge status: Home / Self Care | Payer: Medicaid Other | Attending: Emergency Medicine | Admitting: Emergency Medicine

## 2016-01-21 ENCOUNTER — Encounter (HOSPITAL_COMMUNITY): Payer: Self-pay

## 2016-01-21 DIAGNOSIS — K2970 Gastritis, unspecified, without bleeding: Secondary | ICD-10-CM | POA: Insufficient documentation

## 2016-01-21 DIAGNOSIS — K029 Dental caries, unspecified: Secondary | ICD-10-CM

## 2016-01-21 DIAGNOSIS — F17210 Nicotine dependence, cigarettes, uncomplicated: Secondary | ICD-10-CM | POA: Insufficient documentation

## 2016-01-21 DIAGNOSIS — Z792 Long term (current) use of antibiotics: Secondary | ICD-10-CM | POA: Insufficient documentation

## 2016-01-21 DIAGNOSIS — Z79899 Other long term (current) drug therapy: Secondary | ICD-10-CM | POA: Insufficient documentation

## 2016-01-21 DIAGNOSIS — Z8619 Personal history of other infectious and parasitic diseases: Secondary | ICD-10-CM | POA: Insufficient documentation

## 2016-01-21 DIAGNOSIS — K0381 Cracked tooth: Secondary | ICD-10-CM | POA: Insufficient documentation

## 2016-01-21 DIAGNOSIS — Z872 Personal history of diseases of the skin and subcutaneous tissue: Secondary | ICD-10-CM | POA: Insufficient documentation

## 2016-01-21 LAB — CBC WITH DIFFERENTIAL/PLATELET
Basophils Absolute: 0 10*3/uL (ref 0.0–0.1)
Basophils Relative: 0 %
Eosinophils Absolute: 0.1 10*3/uL (ref 0.0–0.7)
Eosinophils Relative: 1 %
HCT: 38.7 % — ABNORMAL LOW (ref 39.0–52.0)
HEMOGLOBIN: 12.5 g/dL — AB (ref 13.0–17.0)
LYMPHS ABS: 1.7 10*3/uL (ref 0.7–4.0)
LYMPHS PCT: 24 %
MCH: 23.8 pg — AB (ref 26.0–34.0)
MCHC: 32.3 g/dL (ref 30.0–36.0)
MCV: 73.7 fL — AB (ref 78.0–100.0)
Monocytes Absolute: 0.6 10*3/uL (ref 0.1–1.0)
Monocytes Relative: 9 %
NEUTROS ABS: 4.4 10*3/uL (ref 1.7–7.7)
NEUTROS PCT: 66 %
Platelets: 357 10*3/uL (ref 150–400)
RBC: 5.25 MIL/uL (ref 4.22–5.81)
RDW: 16.7 % — ABNORMAL HIGH (ref 11.5–15.5)
WBC: 6.8 10*3/uL (ref 4.0–10.5)

## 2016-01-21 LAB — COMPREHENSIVE METABOLIC PANEL
ALT: 24 U/L (ref 17–63)
AST: 41 U/L (ref 15–41)
Albumin: 4.6 g/dL (ref 3.5–5.0)
Alkaline Phosphatase: 50 U/L (ref 38–126)
Anion gap: 14 (ref 5–15)
BUN: 22 mg/dL — AB (ref 6–20)
CHLORIDE: 97 mmol/L — AB (ref 101–111)
CO2: 28 mmol/L (ref 22–32)
CREATININE: 1.35 mg/dL — AB (ref 0.61–1.24)
Calcium: 9.8 mg/dL (ref 8.9–10.3)
GFR calc Af Amer: >60 mL/min (ref >60)
GFR calc non Af Amer: >60 mL/min (ref >60)
Glucose, Bld: 98 mg/dL (ref 65–99)
POTASSIUM: 3.2 mmol/L — AB (ref 3.5–5.1)
SODIUM: 139 mmol/L (ref 135–145)
Total Bilirubin: 1.5 mg/dL — ABNORMAL HIGH (ref 0.3–1.2)
Total Protein: 8.6 g/dL — ABNORMAL HIGH (ref 6.5–8.1)

## 2016-01-21 LAB — LIPASE, BLOOD: Lipase: 35 U/L (ref 11–51)

## 2016-01-21 MED ORDER — DIPHENHYDRAMINE HCL 50 MG/ML IJ SOLN
25.0000 mg | Freq: Once | INTRAMUSCULAR | Status: AC
  Administered 2016-01-21: 25 mg via INTRAVENOUS
  Filled 2016-01-21: qty 1

## 2016-01-21 MED ORDER — BUPIVACAINE-EPINEPHRINE (PF) 0.5% -1:200000 IJ SOLN
1.8000 mL | Freq: Once | INTRAMUSCULAR | Status: AC
  Administered 2016-01-21: 1.8 mL
  Filled 2016-01-21: qty 1.8

## 2016-01-21 MED ORDER — PROCHLORPERAZINE EDISYLATE 5 MG/ML IJ SOLN
10.0000 mg | Freq: Once | INTRAMUSCULAR | Status: AC
  Administered 2016-01-21: 10 mg via INTRAVENOUS
  Filled 2016-01-21: qty 2

## 2016-01-21 MED ORDER — GI COCKTAIL ~~LOC~~
30.0000 mL | Freq: Once | ORAL | Status: AC
  Administered 2016-01-21: 30 mL via ORAL
  Filled 2016-01-21: qty 30

## 2016-01-21 MED ORDER — ONDANSETRON HCL 4 MG/2ML IJ SOLN
4.0000 mg | Freq: Once | INTRAMUSCULAR | Status: AC
  Administered 2016-01-21: 4 mg via INTRAVENOUS
  Filled 2016-01-21: qty 2

## 2016-01-21 MED ORDER — IBUPROFEN 800 MG PO TABS
800.0000 mg | ORAL_TABLET | Freq: Once | ORAL | Status: AC
  Administered 2016-01-21: 800 mg via ORAL
  Filled 2016-01-21: qty 1

## 2016-01-21 MED ORDER — PROMETHAZINE HCL 25 MG RE SUPP: 25.0000 mg | Freq: Four times a day (QID) | RECTAL | Status: DC | PRN

## 2016-01-21 MED ORDER — SODIUM CHLORIDE 0.9 % IV BOLUS (SEPSIS)
1000.0000 mL | Freq: Once | INTRAVENOUS | Status: AC
Start: 2016-01-21 — End: 2016-01-21
  Administered 2016-01-21: 1000 mL via INTRAVENOUS

## 2016-01-21 NOTE — Discharge Instructions (Signed)
Dental list          updated 1.22.15 These dentists all accept Medicaid.  The list is for your convenience in choosing your child's dentist. Estos dentistas aceptan Medicaid.  La lista es para su conveniencia y es una cortesa.     [facility]     [phone] [address]  [address] [place] [address] Se habla espaol From 1 to 39 years old Parent may go with child Bryan Cobb DDS     [phone] [address] [place]  [address] Se habla espaol From 2 to 13 years old Parent may NOT go with child  [facility]    [phone] [address] [place] [address] Se habla espaol Vietnamese spoken From 2 years old Parent may go with child [facility]     [phone] [address] [place] [address] Se habla espaol From 1 to 20 years old Parent may NOT go with child  Thane Hisaw DDS     [phone] [facility]      [address]  [place] [address] No se habla espaol From teeth coming in Parent may go with child  [facility]     [phone] [address] [place] [address] Requires certification. Call for information. Requiere certificacin. Llame para informacin. Algunos dias se habla espaol  From birth to 20 years Parent possibly goes with child  [facility]al DDS     [phone] [address]  [address] [place] [address] Se habla espaol From 18 months to 18 years  Parent may go with child  [facility]asters DDS    [phone] Eric J. Sadler DDS [address] [place] [address] Se habla espaol From 1 year old Parent may go with child  Perry Jeffries DDS    [phone] [address] [place] [address] Se habla espaol  From 18 months old Parent may go with child J. Selig Cooper DDS    [phone] [address] [place] [address] Se habla espaol From 5 to 26 years old Parent may go with child  [facility]    [phone] [address] [place] [address] No se habla espaol From birth Parent may not go with child     

## 2016-01-21 NOTE — Progress Notes (Signed)
EDCM spoke to patient at bedside. Patient confirms he does not have a pcp or insurance living in [place].  [facility] provided patient with contact information to [facility], informed patient of services there.  EDCM also provided patient with list of pcps who accept self pay patients, list of discount pharmacies and websites needymeds.org and GoodRX.com for medication assistance, phone number to inquire about the orange card, phone number to inquire about Mediciad, phone number to inquire about the Affordable Care Act, financial resources in the community such as local churches, salvation army, urban ministries, and dental assistance for uninsured patients.  Patient thankful for resources.  No further EDCM needs at this time.  Patient agreeable for referral to [facility] for orange card.  Referral placed.

## 2016-01-21 NOTE — ED Notes (Signed)
Pt c/o upper abdominal pain, n/v, and dental pain x "over a week."  Pain score 8/10.  Pt was seen yesterday for same.  Sts he was not prescribed anything for nausea.  Pt's visitor reports "he typically needs a 30 day supple of anti-vomit medicine when this happens."

## 2016-01-21 NOTE — ED Provider Notes (Signed)
CSN: [id number]     Arrival date & time 01/21/16  1759 History   First MD Initiated Contact with Patient 01/21/16 1808     Chief Complaint  Patient presents with  . Abdominal Pain  . Emesis  . Dental Pain     (Consider location/radiation/quality/duration/timing/severity/associated sxs/prior Treatment) Patient is a 40 y.o. male presenting with abdominal pain, vomiting, and tooth pain. The history is provided by the patient.  Abdominal Pain Pain location:  Epigastric Pain quality: burning   Pain radiates to:  Does not radiate Pain severity:  Moderate Onset quality:  Sudden Duration:  2 days Timing:  Constant Progression:  Unchanged Chronicity:  New Relieved by:  Nothing Worsened by:  Nothing tried Ineffective treatments:  None tried Associated symptoms: nausea and vomiting   Associated symptoms: no chest pain, no chills, no diarrhea, no fever and no shortness of breath   Emesis Associated symptoms: no abdominal pain, no arthralgias, no chills, no diarrhea, no headaches and no myalgias   Dental Pain Associated symptoms: no congestion, no facial swelling, no fever and no headaches     40 yo M  With a chief complaint of epigastric abdominal pain nausea and vomiting. Patient has a issue with recurrence of this. States usually is controlled with twice a day Zantac. Patient was seen yesterday with similar symptoms. Was relieved with a GI cocktail. Pain is epigastric radiates up into his chest. Feels like a burning pain. Usually is worse with eating. Denies fevers or chills.  Past Medical History  Diagnosis Date  . Hepatitis C   . Gastritis   . Shoulder dislocation   . Eczema   . Dental caries    History reviewed. No pertinent past surgical history. Family History  Problem Relation Age of Onset  . Rheum arthritis Mother   . Osteoarthritis Mother   . Hypertension Father   . Migraines Sister    Social History  Substance Use Topics  . Smoking status: Current Every Day Smoker  -- 3.00 packs/day for 15 years    Types: Cigarettes  . Smokeless tobacco: Former Neurosurgeon    Quit date: 11/22/2009  . Alcohol Use: Yes     Comment: a beer a month ago    Review of Systems  Constitutional: Negative for fever and chills.  HENT: Negative for congestion and facial swelling.   Eyes: Negative for discharge and visual disturbance.  Respiratory: Negative for shortness of breath.   Cardiovascular: Negative for chest pain and palpitations.  Gastrointestinal: Positive for nausea and vomiting. Negative for abdominal pain and diarrhea.  Musculoskeletal: Negative for myalgias and arthralgias.  Skin: Negative for color change and rash.  Neurological: Negative for tremors, syncope and headaches.  Psychiatric/Behavioral: Negative for confusion and dysphoric mood.      Allergies  Asa  Home Medications   Prior to Admission medications   Medication Sig Start Date End Date Taking? Authorizing Provider  acetaminophen (TYLENOL) 500 MG tablet Take 1,000 mg by mouth every 6 (six) hours as needed for mild pain, moderate pain, fever or headache.    Yes Historical Provider, MD  penicillin v potassium (VEETID) 500 MG tablet Take 1 tablet (500 mg total) by mouth 3 (three) times daily. 01/20/16  Yes Fayrene Helper, PA-C  ranitidine (ZANTAC) 150 MG tablet Take 1 tablet (150 mg total) by mouth daily. 01/20/16  Yes Fayrene Helper, PA-C   BP 142/82 mmHg  Pulse 74  Temp(Src) 98 F (36.7 C) (Oral)  Resp 19  SpO2 98% Physical Exam  Constitutional: He is oriented to person, place, and time. He appears well-developed and well-nourished.  HENT:  Head: Normocephalic and atraumatic.  Mouth/Throat:    Eyes: EOM are normal. Pupils are equal, round, and reactive to light.  Neck: Normal range of motion. Neck supple. No JVD present.  Cardiovascular: Normal rate and regular rhythm.  Exam reveals no gallop and no friction rub.   No murmur heard. Pulmonary/Chest: No respiratory distress. He has no wheezes. He has  no rales.  Abdominal: He exhibits no distension. There is no tenderness. There is no rebound and no guarding.  Musculoskeletal: Normal range of motion.  Neurological: He is alert and oriented to person, place, and time.  Skin: No rash noted. No pallor.  Psychiatric: He has a normal mood and affect. His behavior is normal.  Nursing note and vitals reviewed.   ED Course  .Nerve Block Date/Time: 01/21/2016 8:46 PM Performed by: Melene Plan Authorized by: Melene Plan Consent: Verbal consent obtained. Risks and benefits: risks, benefits and alternatives were discussed Consent given by: patient Required items: required blood products, implants, devices, and special equipment available Patient identity confirmed: verbally with patient Indications: pain relief Body area: face/mouth Nerve: inferior alveolar Laterality: right Patient sedated: no Preparation: Patient was prepped and draped in the usual sterile fashion. Patient position: sitting Needle gauge: 27 G Location technique: anatomical landmarks Local anesthetic: bupivacaine 0.5% with epinephrine Anesthetic total: 10 ml Outcome: pain improved Patient tolerance: Patient tolerated the procedure well with no immediate complications   (including critical care time) Labs Review Labs Reviewed  CBC WITH DIFFERENTIAL/PLATELET - Abnormal; Notable for the following:    Hemoglobin 12.5 (*)    HCT 38.7 (*)    MCV 73.7 (*)    MCH 23.8 (*)    RDW 16.7 (*)    All other components within normal limits  COMPREHENSIVE METABOLIC PANEL - Abnormal; Notable for the following:    Potassium 3.2 (*)    Chloride 97 (*)    BUN 22 (*)    Creatinine, Ser 1.35 (*)    Total Protein 8.6 (*)    Total Bilirubin 1.5 (*)    All other components within normal limits  LIPASE, BLOOD    Imaging Review No results found. I have personally reviewed and evaluated these images and lab results as part of my medical decision-making.   EKG Interpretation None        MDM   Final diagnoses:  None    40 yo M  With a chief complaint of dental pain as well as nausea vomiting and epigastric abdominal pain. Patient has had recurrent episodes of this. Was actually seen here yesterday improved with a GI cocktail will sent home. Patient had worsening of his symptoms at home as he said he has no medicine for nausea there. Vomited all throughout the day and having worsening epigastric abdominal pain. Patient was dental block at bedside. Had significant improvement of his dental pain. GI cocktail Compazine and Zofran were given with improvement of his nausea. Able to tolerate by mouth. Discharged home.  8:48 PM:  I have discussed the diagnosis/risks/treatment options with the patient and family and believe the pt to be eligible for discharge home to follow-up with PCP, GI. We also discussed returning to the ED immediately if new or worsening sx occur. We discussed the sx which are most concerning (e.g., sudden worsening pain, fever, inability to tolerate by mouth) that necessitate immediate return. Medications administered to the patient during their visit and  any new prescriptions provided to the patient are listed below.  Medications given during this visit Medications  sodium chloride 0.9 % bolus 1,000 mL (0 mLs Intravenous Stopped 01/21/16 1949)  ondansetron (ZOFRAN) injection 4 mg (4 mg Intravenous Given 01/21/16 1854)  gi cocktail (Maalox,Lidocaine,Donnatal) (30 mLs Oral Given 01/21/16 1834)  bupivacaine-epinephrine (MARCAINE W/ EPI) 0.5% -1:200000 injection 1.8 mL (1.8 mLs Infiltration Given 01/21/16 1950)  ibuprofen (ADVIL,MOTRIN) tablet 800 mg (800 mg Oral Given 01/21/16 1958)  prochlorperazine (COMPAZINE) injection 10 mg (10 mg Intravenous Given 01/21/16 1958)  diphenhydrAMINE (BENADRYL) injection 25 mg (25 mg Intravenous Given 01/21/16 1958)    New Prescriptions   No medications on file    The patient appears reasonably screen and/or stabilized for discharge and  I doubt any other medical condition or other [facility] requiring further screening, evaluation, or treatment in the ED at this time prior to discharge.      Melene Plan, DO 01/21/16 2048

## 2016-01-30 ENCOUNTER — Emergency Department (HOSPITAL_COMMUNITY)
Admission: EM | Admit: 2016-01-30 | Discharge: 2016-01-30 | Disposition: A | Discharge status: Home / Self Care | Payer: Medicaid Other | Attending: Emergency Medicine | Admitting: Emergency Medicine

## 2016-01-30 ENCOUNTER — Encounter (HOSPITAL_COMMUNITY): Payer: Self-pay | Admitting: Emergency Medicine

## 2016-01-30 DIAGNOSIS — R112 Nausea with vomiting, unspecified: Secondary | ICD-10-CM | POA: Insufficient documentation

## 2016-01-30 DIAGNOSIS — F17210 Nicotine dependence, cigarettes, uncomplicated: Secondary | ICD-10-CM | POA: Insufficient documentation

## 2016-01-30 DIAGNOSIS — R109 Unspecified abdominal pain: Secondary | ICD-10-CM | POA: Insufficient documentation

## 2016-01-30 LAB — COMPREHENSIVE METABOLIC PANEL
ALK PHOS: 54 U/L (ref 38–126)
ALT: 21 U/L (ref 17–63)
AST: 25 U/L (ref 15–41)
Albumin: 4.6 g/dL (ref 3.5–5.0)
Anion gap: 9 (ref 5–15)
BUN: 13 mg/dL (ref 6–20)
CO2: 31 mmol/L (ref 22–32)
Calcium: 9.6 mg/dL (ref 8.9–10.3)
Chloride: 102 mmol/L (ref 101–111)
Creatinine, Ser: 1.24 mg/dL (ref 0.61–1.24)
Glucose, Bld: 102 mg/dL — ABNORMAL HIGH (ref 65–99)
Potassium: 3.8 mmol/L (ref 3.5–5.1)
Sodium: 142 mmol/L (ref 135–145)
TOTAL PROTEIN: 8.7 g/dL — AB (ref 6.5–8.1)
Total Bilirubin: 1 mg/dL (ref 0.3–1.2)

## 2016-01-30 LAB — CBC
HEMATOCRIT: 37.6 % — AB (ref 39.0–52.0)
HEMOGLOBIN: 11.9 g/dL — AB (ref 13.0–17.0)
MCH: 23.9 pg — ABNORMAL LOW (ref 26.0–34.0)
MCHC: 31.6 g/dL (ref 30.0–36.0)
MCV: 75.5 fL — ABNORMAL LOW (ref 78.0–100.0)
Platelets: 377 10*3/uL (ref 150–400)
RBC: 4.98 MIL/uL (ref 4.22–5.81)
RDW: 17.1 % — ABNORMAL HIGH (ref 11.5–15.5)
WBC: 8.8 10*3/uL (ref 4.0–10.5)

## 2016-01-30 LAB — LIPASE, BLOOD: LIPASE: 21 U/L (ref 11–51)

## 2016-01-30 MED ORDER — ONDANSETRON 4 MG PO TBDP
4.0000 mg | ORAL_TABLET | Freq: Once | ORAL | Status: AC | PRN
  Administered 2016-01-30: 4 mg via ORAL
  Filled 2016-01-30: qty 1

## 2016-01-30 NOTE — ED Notes (Signed)
Was seen here a couple weeks ago for abdominal pain, nausea, and vomiting. They say the only relief he's had has been with phenergan suppositories. Hx of severe acid reflux. Pt states the last time they gave him the suppositories the symptoms subsided, however this time they've continued over the last two weeks.

## 2016-01-30 NOTE — ED Notes (Signed)
Pt called multiple times,  No answer

## 2016-01-30 NOTE — ED Notes (Signed)
No answer when called to reassess pt

## 2016-01-31 ENCOUNTER — Encounter (HOSPITAL_COMMUNITY): Payer: Self-pay | Admitting: Emergency Medicine

## 2016-01-31 ENCOUNTER — Emergency Department (HOSPITAL_COMMUNITY)
Admission: EM | Admit: 2016-01-31 | Discharge: 2016-01-31 | Disposition: A | Discharge status: Home / Self Care | Payer: Medicaid Other | Attending: Emergency Medicine | Admitting: Emergency Medicine

## 2016-01-31 DIAGNOSIS — F1290 Cannabis use, unspecified, uncomplicated: Secondary | ICD-10-CM

## 2016-01-31 DIAGNOSIS — Z87828 Personal history of other (healed) physical injury and trauma: Secondary | ICD-10-CM | POA: Insufficient documentation

## 2016-01-31 DIAGNOSIS — R1013 Epigastric pain: Secondary | ICD-10-CM | POA: Insufficient documentation

## 2016-01-31 DIAGNOSIS — F17210 Nicotine dependence, cigarettes, uncomplicated: Secondary | ICD-10-CM | POA: Insufficient documentation

## 2016-01-31 DIAGNOSIS — F1210 Cannabis abuse, uncomplicated: Secondary | ICD-10-CM | POA: Insufficient documentation

## 2016-01-31 DIAGNOSIS — F12988 Cannabis use, unspecified with other cannabis-induced disorder: Secondary | ICD-10-CM

## 2016-01-31 DIAGNOSIS — Z8719 Personal history of other diseases of the digestive system: Secondary | ICD-10-CM | POA: Insufficient documentation

## 2016-01-31 DIAGNOSIS — Z8619 Personal history of other infectious and parasitic diseases: Secondary | ICD-10-CM | POA: Insufficient documentation

## 2016-01-31 DIAGNOSIS — F1310 Sedative, hypnotic or anxiolytic abuse, uncomplicated: Secondary | ICD-10-CM | POA: Insufficient documentation

## 2016-01-31 DIAGNOSIS — Z79899 Other long term (current) drug therapy: Secondary | ICD-10-CM | POA: Insufficient documentation

## 2016-01-31 DIAGNOSIS — Z872 Personal history of diseases of the skin and subcutaneous tissue: Secondary | ICD-10-CM | POA: Insufficient documentation

## 2016-01-31 LAB — URINALYSIS, ROUTINE W REFLEX MICROSCOPIC
BILIRUBIN URINE: NEGATIVE
Glucose, UA: NEGATIVE mg/dL
Hgb urine dipstick: NEGATIVE
KETONES UR: NEGATIVE mg/dL
LEUKOCYTES UA: NEGATIVE
NITRITE: NEGATIVE
PH: 7.5 (ref 5.0–8.0)
PROTEIN: 100 mg/dL — AB
Specific Gravity, Urine: 1.03 (ref 1.005–1.030)

## 2016-01-31 LAB — COMPREHENSIVE METABOLIC PANEL
ALBUMIN: 4.3 g/dL (ref 3.5–5.0)
ALT: 19 U/L (ref 17–63)
AST: 24 U/L (ref 15–41)
Alkaline Phosphatase: 53 U/L (ref 38–126)
Anion gap: 9 (ref 5–15)
BUN: 19 mg/dL (ref 6–20)
CALCIUM: 9.3 mg/dL (ref 8.9–10.3)
CHLORIDE: 99 mmol/L — AB (ref 101–111)
CO2: 30 mmol/L (ref 22–32)
Creatinine, Ser: 1.28 mg/dL — ABNORMAL HIGH (ref 0.61–1.24)
GFR calc Af Amer: >60 mL/min (ref >60)
GLUCOSE: 100 mg/dL — AB (ref 65–99)
POTASSIUM: 3.5 mmol/L (ref 3.5–5.1)
Sodium: 138 mmol/L (ref 135–145)
TOTAL PROTEIN: 8.2 g/dL — AB (ref 6.5–8.1)
Total Bilirubin: 1.1 mg/dL (ref 0.3–1.2)

## 2016-01-31 LAB — RAPID URINE DRUG SCREEN, HOSP PERFORMED
Amphetamines: NOT DETECTED
BARBITURATES: POSITIVE — AB
BENZODIAZEPINES: NOT DETECTED
COCAINE: NOT DETECTED
OPIATES: NOT DETECTED
Tetrahydrocannabinol: POSITIVE — AB

## 2016-01-31 LAB — CBC
HEMATOCRIT: 37.4 % — AB (ref 39.0–52.0)
Hemoglobin: 11.8 g/dL — ABNORMAL LOW (ref 13.0–17.0)
MCH: 23.5 pg — ABNORMAL LOW (ref 26.0–34.0)
MCHC: 31.6 g/dL (ref 30.0–36.0)
MCV: 74.5 fL — ABNORMAL LOW (ref 78.0–100.0)
Platelets: 370 10*3/uL (ref 150–400)
RBC: 5.02 MIL/uL (ref 4.22–5.81)
RDW: 16.8 % — AB (ref 11.5–15.5)
WBC: 7.9 10*3/uL (ref 4.0–10.5)

## 2016-01-31 LAB — LIPASE, BLOOD: LIPASE: 19 U/L (ref 11–51)

## 2016-01-31 LAB — URINE MICROSCOPIC-ADD ON

## 2016-01-31 MED ORDER — ONDANSETRON 4 MG PO TBDP
4.0000 mg | ORAL_TABLET | Freq: Once | ORAL | Status: AC | PRN
  Administered 2016-01-31: 4 mg via ORAL
  Filled 2016-01-31: qty 1

## 2016-01-31 MED ORDER — HALOPERIDOL LACTATE 5 MG/ML IJ SOLN
5.0000 mg | Freq: Once | INTRAMUSCULAR | Status: AC
  Administered 2016-01-31: 5 mg via INTRAVENOUS
  Filled 2016-01-31: qty 1

## 2016-01-31 MED ORDER — SODIUM CHLORIDE 0.9 % IV BOLUS (SEPSIS)
1000.0000 mL | Freq: Once | INTRAVENOUS | Status: AC
  Administered 2016-01-31: 1000 mL via INTRAVENOUS

## 2016-01-31 MED ORDER — OMEPRAZOLE 20 MG PO CPDR: 20.0000 mg | DELAYED_RELEASE_CAPSULE | Freq: Every day | ORAL | Status: DC

## 2016-01-31 NOTE — Discharge Instructions (Signed)
Cannabis Use Disorder Cannabis use disorder is a mental disorder. It is not one-time or occasional use of cannabis, more commonly known as marijuana. Cannabis use disorder is the continued, nonmedical use of cannabis that interferes with normal life activities or causes health problems. People with cannabis use disorder get a feeling of extreme pleasure and relaxation from cannabis use. This "high" is very rewarding and causes people to use over and over.  The mind-altering ingredient in cannabis is know as THC. THC can also interfere with motor coordination, memory, judgment, and accurate sense of space and time. These effects can last for a few days after using cannabis. Regular heavy cannabis use can cause long-lasting problems with thinking and learning. In young people, these problems may be permanent. Cannabis sometimes causes severe anxiety, paranoia, or visual hallucinations. Man-made (synthetic) cannabis-like drugs, such as "spice" and "K2," cause the same effects as THC but are much stronger. Cannabis-like drugs can cause dangerously high blood pressure and heart rate.  Cannabis use disorder usually starts in the teenage years. It can trigger the development of schizophrenia. It is somewhat more common in men than women. People who have family members with the disorder or existing mental health issues such as depression and posttraumatic stress disorderare more likely to develop cannabis use disorder. People with cannabis use disorder are at higher risk for use of other drugs of abuse.  SIGNS AND SYMPTOMS Signs and symptoms of cannabis use disorder include:   Use of cannabis in larger amounts or over a longer period than intended.   Unsuccessful attempts to cut down or control cannabis use.   A lot of time spent obtaining, using, or recovering from the effects of cannabis.   A strong desire or urge to use cannabis (cravings).   Continued use of cannabis in spite of problems at work,  school, or home because of use.   Continued use of cannabis in spite of relationship problems because of use.  Giving up or cutting down on important life activities because of cannabis use.  Use of cannabis over and over even in situations when it is physically hazardous, such as when driving a car.   Continued use of cannabis in spite of a physical problem that is likely related to use. Physical problems can include:  Chronic cough.  Bronchitis.  Emphysema.  Throat and lung cancer.  Continued use of cannabis in spite of a mental problem that is likely related to use. Mental problems can include:  Psychosis.  Anxiety.  Difficulty sleeping.  Need to use more and more cannabis to get the same effect, or lessened effect over time with use of the same amount (tolerance).  Having withdrawal symptoms when cannabis use is stopped, or using cannabis to reduce or avoid withdrawal symptoms. Withdrawal symptoms include:  Irritability or anger.  Anxiety or restlessness.  Difficulty sleeping.  Loss of appetite or weight.  Aches and pains.  Shakiness.  Sweating.  Chills. DIAGNOSIS Cannabis use disorder is diagnosed by your health care provider. You may be asked questions about your cannabis use and how it affects your life. A physical exam may be done. A drug screen may be done. You may be referred to a mental health professional. The diagnosis of cannabis use disorder requires at least two symptoms within 12 months. The type of cannabis use disorder you have depends on the number of symptoms you have. The type may be:  Mild. Two or three signs and symptoms.   Moderate. Four or   five signs and symptoms.   Severe. Six or more signs and symptoms.  TREATMENT Treatment is usually provided by mental health professionals with training in substance use disorders. The following options are available:  Counseling or talk therapy. Talk therapy addresses the reasons you use  cannabis. It also addresses ways to keep you from using again. The goals of talk therapy include:  Identifying and avoiding triggers for use.  Learning how to handle cravings.  Replacing use with healthy activities.  Support groups. Support groups provide emotional support, advice, and guidance.  Medicine. Medicine is used to treat mental health issues that trigger cannabis use or that result from it. HOME CARE INSTRUCTIONS  Take medicines only as directed by your health care provider.  Check with your health care provider before starting any new medicines.  Keep all follow-up visits as directed by your health care provider. SEEK MEDICAL CARE IF:  You are not able to take your medicines as directed.  Your symptoms get worse. SEEK IMMEDIATE MEDICAL CARE IF: You have serious thoughts about hurting yourself or others. FOR MORE INFORMATION  National Institute on Drug Abuse: http://www.price-smith.com/  Substance Abuse and Mental Health Services Administration: SkateOasis.com.pt   This information is not intended to replace advice given to you by your health care provider. Make sure you discuss any questions you have with your health care provider.   Document Released: 12/03/2000 Document Revised: 12/27/2014 Document Reviewed: 12/19/2013 Elsevier Interactive Patient Education [employer]  Your urine drug screen today tested positive for marijuana and barbiturates which may be contributing to your symptoms. Please discontinue the use of marijuana completely and any other medications he might be taking except what is prescribed.

## 2016-01-31 NOTE — ED Notes (Signed)
Patient given urine and asked to provide urine specimen.

## 2016-01-31 NOTE — ED Notes (Signed)
Pt reports ongoing emesis and abdominal pain related to acid reflux since 01/21/16.

## 2016-01-31 NOTE — ED Provider Notes (Signed)
CSN: [id number]     Arrival date & time 01/31/16  1444 History   First MD Initiated Contact with Patient 01/31/16 1614     Chief Complaint  Patient presents with  . Abdominal Pain  . Emesis     (Consider location/radiation/quality/duration/timing/severity/associated sxs/prior Treatment) Patient is a 40 y.o. male presenting with abdominal pain and vomiting. The history is provided by the patient.  Abdominal Pain Pain location:  Epigastric Pain quality: aching   Pain radiates to:  Does not radiate Pain severity:  Moderate Onset quality:  Gradual Duration: 3 months. Timing:  Constant Progression:  Unchanged Chronicity:  New Context: suspicious food intake (hamburger 10 days ago hat started these symptoms )   Relieved by:  Nothing Worsened by:  Nothing tried Ineffective treatments:  None tried Associated symptoms: vomiting   Emesis Associated symptoms: abdominal pain     Past Medical History  Diagnosis Date  . Hepatitis C   . Gastritis   . Shoulder dislocation   . Eczema   . Dental caries    History reviewed. No pertinent past surgical history. Family History  Problem Relation Age of Onset  . Rheum arthritis Mother   . Osteoarthritis Mother   . Hypertension Father   . Migraines Sister    Social History  Substance Use Topics  . Smoking status: Current Every Day Smoker -- 3.00 packs/day for 15 years    Types: Cigarettes  . Smokeless tobacco: Former Neurosurgeon    Quit date: 11/22/2009  . Alcohol Use: Yes     Comment: a beer a month ago    Review of Systems  Gastrointestinal: Positive for vomiting and abdominal pain.  All other systems reviewed and are negative.     Allergies  Asa  Home Medications   Prior to Admission medications   Medication Sig Start Date End Date Taking? Authorizing Provider  promethazine (PHENERGAN) 25 MG suppository Place 1 suppository (25 mg total) rectally every 6 (six) hours as needed for nausea or vomiting. 01/21/16  Yes Melene Plan, DO   ranitidine (ZANTAC) 150 MG tablet Take 1 tablet (150 mg total) by mouth daily. 01/20/16  Yes Fayrene Helper, PA-C  acetaminophen (TYLENOL) 500 MG tablet Take 1,000 mg by mouth every 6 (six) hours as needed for mild pain, moderate pain, fever or headache.     Historical Provider, MD  penicillin v potassium (VEETID) 500 MG tablet Take 1 tablet (500 mg total) by mouth 3 (three) times daily. Patient not taking: Reported on 01/31/2016 01/20/16   Fayrene Helper, PA-C   BP 140/83 mmHg  Pulse 84  Temp(Src) 98.3 F (36.8 C) (Oral)  Resp 18  Ht  (1.93 m)  Wt 203 lb (92.08 kg)  BMI 24.72 kg/m2  SpO2 100% Physical Exam  Constitutional: He is oriented to person, place, and time. He appears well-developed and well-nourished. No distress.  HENT:  Head: Normocephalic and atraumatic.  Eyes: Conjunctivae are normal.  Neck: Neck supple. No tracheal deviation present.  Cardiovascular: Normal rate, regular rhythm and normal heart sounds.   Pulmonary/Chest: Effort normal and breath sounds normal. No respiratory distress. He has no wheezes.  Abdominal: Soft. He exhibits no distension. There is no tenderness. There is no rebound and no guarding.  Neurological: He is alert and oriented to person, place, and time.  Skin: Skin is warm and dry.  Psychiatric: He has a normal mood and affect.  Vitals reviewed.   ED Course  Procedures (including critical care time) Labs Review Labs Reviewed  COMPREHENSIVE METABOLIC PANEL - Abnormal; Notable for the following:    Chloride 99 (*)    Glucose, Bld 100 (*)    Creatinine, Ser 1.28 (*)    Total Protein 8.2 (*)    All other components within normal limits  CBC - Abnormal; Notable for the following:    Hemoglobin 11.8 (*)    HCT 37.4 (*)    MCV 74.5 (*)    MCH 23.5 (*)    RDW 16.8 (*)    All other components within normal limits  URINE RAPID DRUG SCREEN, HOSP PERFORMED - Abnormal; Notable for the following:    Tetrahydrocannabinol POSITIVE (*)    Barbiturates  POSITIVE (*)    All other components within normal limits  LIPASE, BLOOD  URINALYSIS, ROUTINE W REFLEX MICROSCOPIC (NOT AT [facility])    Imaging Review No results found. I have personally reviewed and evaluated these images and lab results as part of my medical decision-making.   EKG Interpretation None      MDM   Final diagnoses:  Cannabinoid hyperemesis syndrome (HCC)    40 y.o. male presents with recurrent vomiting episodes over the last 3 months. This current episode started 2 days ago after eating a [employer] hamburger. The patient admits to intermittent cannabis use including following the vomiting from his previous visit. Vital signs are stable, patient is afebrile, no significant metabolic or hematologic abnormalities to suggest acute surgical emergency and exam is benign. At this time I discussed the possibility of cannabinoid hyperemesis given the timing of his symptoms relating to marijuana use. Patient was offered Haldol for dopamine suppression and help with vomiting, sleep deprivation and pain. Patient given 2 L of IV fluid for help with mild clinical dehydration. No indication for imaging currently. Patient needs to establish primary care in the area and was provided contact information to do so.   Patient reassessed, no longer vomiting, able to sleep comfortably in the emergency department , denies barbiturate use despite being on his drug screen suggesting possibility of his street marijuana  laced with antiseizure meds or patient being unwilling to disclose other drug use.  Lyndal Pulley, MD 01/31/16 2001

## 2016-01-31 NOTE — ED Notes (Signed)
Pt reminded of need for urine specimen 

## 2019-07-16 ENCOUNTER — Emergency Department (HOSPITAL_COMMUNITY): Payer: Self-pay

## 2019-07-16 ENCOUNTER — Other Ambulatory Visit: Payer: Self-pay

## 2019-07-16 ENCOUNTER — Encounter (HOSPITAL_COMMUNITY): Payer: Self-pay | Admitting: Emergency Medicine

## 2019-07-16 ENCOUNTER — Emergency Department (HOSPITAL_COMMUNITY)
Admission: EM | Admit: 2019-07-16 | Discharge: 2019-07-16 | Disposition: A | Discharge status: Home / Self Care | Payer: Self-pay | Attending: Emergency Medicine | Admitting: Emergency Medicine

## 2019-07-16 DIAGNOSIS — R112 Nausea with vomiting, unspecified: Secondary | ICD-10-CM | POA: Insufficient documentation

## 2019-07-16 DIAGNOSIS — Z79899 Other long term (current) drug therapy: Secondary | ICD-10-CM | POA: Insufficient documentation

## 2019-07-16 DIAGNOSIS — R1013 Epigastric pain: Secondary | ICD-10-CM | POA: Insufficient documentation

## 2019-07-16 DIAGNOSIS — F17210 Nicotine dependence, cigarettes, uncomplicated: Secondary | ICD-10-CM | POA: Insufficient documentation

## 2019-07-16 LAB — COMPREHENSIVE METABOLIC PANEL
ALT: 21 U/L (ref 0–44)
AST: 36 U/L (ref 15–41)
Albumin: 4.5 g/dL (ref 3.5–5.0)
Alkaline Phosphatase: 48 U/L (ref 38–126)
Anion gap: 13 (ref 5–15)
BUN: 30 mg/dL — ABNORMAL HIGH (ref 6–20)
CO2: 25 mmol/L (ref 22–32)
Calcium: 9.5 mg/dL (ref 8.9–10.3)
Chloride: 99 mmol/L (ref 98–111)
Creatinine, Ser: 1.33 mg/dL — ABNORMAL HIGH (ref 0.61–1.24)
GFR calc Af Amer: >60 mL/min (ref >60)
GFR calc non Af Amer: >60 mL/min (ref >60)
Glucose, Bld: 108 mg/dL — ABNORMAL HIGH (ref 70–99)
Potassium: 3.7 mmol/L (ref 3.5–5.1)
Sodium: 137 mmol/L (ref 135–145)
Total Bilirubin: 1.1 mg/dL (ref 0.3–1.2)
Total Protein: 8.9 g/dL — ABNORMAL HIGH (ref 6.5–8.1)

## 2019-07-16 LAB — URINALYSIS, ROUTINE W REFLEX MICROSCOPIC
Bacteria, UA: NONE SEEN
Bilirubin Urine: NEGATIVE
Glucose, UA: NEGATIVE mg/dL
Hgb urine dipstick: NEGATIVE
Ketones, ur: 20 mg/dL — AB
Leukocytes,Ua: NEGATIVE
Nitrite: NEGATIVE
Protein, ur: 100 mg/dL — AB
Specific Gravity, Urine: 1.034 — ABNORMAL HIGH (ref 1.005–1.030)
pH: 6 (ref 5.0–8.0)

## 2019-07-16 LAB — CBC
HCT: 43.4 % (ref 39.0–52.0)
Hemoglobin: 12.6 g/dL — ABNORMAL LOW (ref 13.0–17.0)
MCH: 19.7 pg — ABNORMAL LOW (ref 26.0–34.0)
MCHC: 29 g/dL — ABNORMAL LOW (ref 30.0–36.0)
MCV: 67.9 fL — ABNORMAL LOW (ref 80.0–100.0)
Platelets: 420 10*3/uL — ABNORMAL HIGH (ref 150–400)
RBC: 6.39 MIL/uL — ABNORMAL HIGH (ref 4.22–5.81)
RDW: 21.7 % — ABNORMAL HIGH (ref 11.5–15.5)
WBC: 9.1 10*3/uL (ref 4.0–10.5)
nRBC: 0 % (ref 0.0–0.2)

## 2019-07-16 LAB — LIPASE, BLOOD: Lipase: 24 U/L (ref 11–51)

## 2019-07-16 MED ORDER — SODIUM CHLORIDE 0.9% FLUSH
3.0000 mL | Freq: Once | INTRAVENOUS | Status: AC
  Administered 2019-07-16: 3 mL via INTRAVENOUS

## 2019-07-16 MED ORDER — PROMETHAZINE HCL 25 MG/ML IJ SOLN
25.0000 mg | Freq: Once | INTRAMUSCULAR | Status: AC
  Administered 2019-07-16: 25 mg via INTRAVENOUS
  Filled 2019-07-16: qty 1

## 2019-07-16 MED ORDER — LIDOCAINE VISCOUS HCL 2 % MT SOLN
15.0000 mL | Freq: Once | OROMUCOSAL | Status: AC
  Administered 2019-07-16: 15 mL via ORAL
  Filled 2019-07-16: qty 15

## 2019-07-16 MED ORDER — SODIUM CHLORIDE 0.9 % IV BOLUS
1000.0000 mL | Freq: Once | INTRAVENOUS | Status: AC
  Administered 2019-07-16: 1000 mL via INTRAVENOUS

## 2019-07-16 MED ORDER — ALUM & MAG HYDROXIDE-SIMETH 200-200-20 MG/5ML PO SUSP
30.0000 mL | Freq: Once | ORAL | Status: AC
  Administered 2019-07-16: 30 mL via ORAL
  Filled 2019-07-16: qty 30

## 2019-07-16 MED ORDER — ONDANSETRON 4 MG PO TBDP: 4.0000 mg | ORAL_TABLET | Freq: Three times a day (TID) | ORAL | 0 refills | Status: DC | PRN

## 2019-07-16 MED ORDER — ONDANSETRON HCL 4 MG/2ML IJ SOLN
4.0000 mg | Freq: Once | INTRAMUSCULAR | Status: AC
  Administered 2019-07-16: 4 mg via INTRAVENOUS
  Filled 2019-07-16: qty 2

## 2019-07-16 NOTE — ED Notes (Addendum)
US at bedside

## 2019-07-16 NOTE — ED Notes (Signed)
Pt ambulatory from triage to room 

## 2019-07-16 NOTE — Discharge Instructions (Addendum)
You have been seen today for nausea, vomiting, and abdominal pain. Please read and follow all provided instructions.   1. Medications: zofran (for nausea and vomiting), usual home medications 2. Treatment: rest, drink plenty of fluids, advance diet as tolerated 3. Follow Up: Please follow up with your primary doctor in 2-5 days for discussion of your diagnoses and further evaluation after today's visit; if you do not have a primary care doctor use the resource guide provided to find one; Please return to the ER for any new or worsening symptoms. Please obtain all of your results from medical records or have your doctors office obtain the results - share them with your doctor - you should be seen at your doctors office. Call today to arrange your follow up.   Take medications as prescribed. Please review all of the medicines and only take them if you do not have an allergy to them. Return to the emergency room for worsening condition or new concerning symptoms. Follow up with your regular doctor. If you don't have a regular doctor use one of the numbers below to establish a primary care doctor.  Please be aware that if you are taking birth control pills, taking other prescriptions, ESPECIALLY ANTIBIOTICS may make the birth control ineffective - if this is the case, either do not engage in sexual activity or use alternative methods of birth control such as condoms until you have finished the medicine and your family doctor says it is OK to restart them. If you are on a blood thinner such as COUMADIN, be aware that any other medicine that you take may cause the coumadin to either work too much, or not enough - you should have your coumadin level rechecked in next 7 days if this is the case.  ?  It is also a possibility that you have an allergic reaction to any of the medicines that you have been prescribed - Everybody reacts differently to medications and while MOST people have no trouble with most medicines,  you may have a reaction such as nausea, vomiting, rash, swelling, shortness of breath. If this is the case, please stop taking the medicine immediately and contact your physician.  ?  You should return to the ER if you develop severe or worsening symptoms.   Emergency Department Resource Guide 1) Find a Doctor and Pay Out of Pocket Although you won't have to find out who is covered by your insurance plan, it is a good idea to ask around and get recommendations. You will then need to call the office and see if the doctor you have chosen will accept you as a new patient and what types of options they offer for patients who are self-pay. Some doctors offer discounts or will set up payment plans for their patients who do not have insurance, but you will need to ask so you aren't surprised when you get to your appointment.  2) Contact Your Local Health Department Not all health departments have doctors that can see patients for sick visits, but many do, so it is worth a call to see if yours does. If you don't know where your local health department is, you can check in your phone book. The CDC also has a tool to help you locate your state's health department, and many state websites also have listings of all of their local health departments.  3) Find a [facility] If your illness is not likely to be very severe or complicated, you may want  to try a walk in clinic. These are popping up all over the country in pharmacies, drugstores, and shopping centers. They're usually staffed by nurse practitioners or physician assistants that have been trained to treat common illnesses and complaints. They're usually fairly quick and inexpensive. However, if you have serious medical issues or chronic medical problems, these are probably not your best option.  No Primary Care Doctor: Call Health Connect at  [phone] - they can help you locate a primary care doctor that  accepts your insurance, provides certain  services, etc. Physician Referral Service- [phone]  Chronic Pain Problems: Organization         Address  Phone   Notes  [facility]  [phone] Patients need to be referred by their primary care doctor.   Medication Assistance: Organization         Address  Phone   Notes  [facility] [address] [place], [place] [address] [phone]) [phone] --Must be a resident of [facility] -- Must have NO insurance coverage whatsoever (no Medicaid/ Medicare, etc.) -- The pt. MUST have a primary care doctor that directs their care regularly and follows them in the community   [facility]  [phone]   [employer]  [phone]    Agencies that provide inexpensive medical care: Organization         Address  Phone   Notes  [facility]  [phone]   [facility]    [phone]) [phone]   [facility] [address] [place], [place] [address] [phone]   [facility] [address], [place] [phone]   [facility]    [phone]) [phone]   [facility]    [phone]) [phone]   [facility]  [address], [place] Phone:  [phone], Fax:  [phone] Hours of Operation:  9 am - 6 pm, M-F.  Also accepts Medicaid/Medicare and self-pay.  [facility]  [address], [place] Phone: [phone], Fax: [phone]. Hours of Operation:  8:30 am - 5:30 pm, M-F.  Also accepts Medicaid and self-pay.  [facility] [place] [address], [place] Phone: [phone]   [facility] [address], [place], [place] [phone], Ext. [phone] Mondays & Thursdays: 7-9 AM.  First 15 patients are seen on a first come, first serve basis.    Medicaid-accepting [facility] Providers:  Organization         Address  Phone   Notes  [facility]  [address], [place] [phone] Also accepts self-pay patients.  [facility] [address], [place]  [phone]   [facility] [address], [place] [phone]) [phone]   [facility] [address] [place] [address], [place] [phone]   [facility] RakersVe[facility] Bland [address], [place]   [phone] Only accepts [place] patients after they have their name applied to their card.   Self-Pay (no insurance) in [facility]:  Organization         Address  Phone   Notes  Sickle Cell Patients, [facility] [address], [place] [phone]) [phone]   [facility] [address], [place] [phone]  [facility] [place]  [address], [place] [phone]   [facility]/Dr. Osei-Bonsu  [address] [place] or [address], [place] [phone] Phone number for both [place] and [facility] [place] locations is the same.  [facility] [address], [place] [phone]   [facility] [address] [place] or [address] [phone]   [facility] [address], [place] [phone], phone; [phone], fax Sees patients 1st and 3rd Saturday of every month.  Must not qualify for public or private insurance (i.e. Medicaid, Medicare, [employer] Choice, Veterans' Benefits)  Household income should be no more than 200% of the poverty level The clinic cannot treat you if you are pregnant or think you are pregnant  Sexually transmitted diseases are not treated at the clinic.

## 2019-07-16 NOTE — ED Notes (Signed)
ED Provider at bedside. 

## 2019-07-16 NOTE — ED Triage Notes (Signed)
The patient complains of emesis and abdominal cramping. The patient states, "Since Friday I have been unable to hold anything down."

## 2019-07-16 NOTE — ED Provider Notes (Signed)
[facility]-EMERGENCY DEPT Provider Note   CSN: [id number] Arrival date & time: 07/16/19  1117    History   Chief Complaint Chief Complaint  Patient presents with  . Abdominal Pain  . Emesis    HPI William Horne is a 43 y.o. male with a PMH hepatitis C, Gastritis, Cannabinoid Hyperemesis Syndrome, and Eczema presenting with intermittent nausea, vomiting, and epigastric abdominal pain onset 4 days ago. Patient describes abdominal pain as cramping and states it is only present with vomiting. Patient reports multiple episodes of non bilious non bloody vomiting. Patient reports nothing makes symptoms better or worse. Patient denies taking any medications. Patient denies diarrhea or constipation. Patient denies fever, chills, cough, congestion, shortness of breath, chest pain, sick exposures, or recent travel. Patient denies dysuria, hematuria, or flank pain. Patient denies any abdominal surgeries.      HPI  Past Medical History:  Diagnosis Date  . Dental caries   . Eczema   . Gastritis   . Hepatitis C   . Shoulder dislocation     There are no active problems to display for this patient.   History reviewed. No pertinent surgical history.      Home Medications    Prior to Admission medications   Medication Sig Start Date End Date Taking? Authorizing Provider  bismuth subsalicylate (PEPTO BISMOL) 262 MG/15ML suspension Take 30 mLs by mouth every 6 (six) hours as needed for indigestion or diarrhea or loose stools.   Yes [provider]  omeprazole (PRILOSEC) 20 MG capsule Take 1 capsule (20 mg total) by mouth daily. Patient not taking: Reported on 07/16/2019 01/31/16   Lyndal PulleyKnott, Daniel, MD  ondansetron (ZOFRAN ODT) 4 MG disintegrating tablet Take 1 tablet (4 mg total) by mouth every 8 (eight) hours as needed for nausea or vomiting. 07/16/19   Carlyle BasquesHernandez, Thane Age P, PA-C  penicillin v potassium (VEETID) 500 MG tablet Take 1 tablet (500 mg total) by mouth  3 (three) times daily. Patient not taking: Reported on 01/31/2016 01/20/16   Fayrene Helperran, Bowie, PA-C  promethazine (PHENERGAN) 25 MG suppository Place 1 suppository (25 mg total) rectally every 6 (six) hours as needed for nausea or vomiting. Patient not taking: Reported on 07/16/2019 01/21/16   Melene PlanFloyd, Dan, DO  ranitidine (ZANTAC) 150 MG tablet Take 1 tablet (150 mg total) by mouth daily. Patient not taking: Reported on 07/16/2019 01/20/16   Fayrene Helperran, Bowie, PA-C    Family History Family History  Problem Relation Age of Onset  . Rheum arthritis Mother   . Osteoarthritis Mother   . Hypertension Father   . Migraines Sister     Social History Social History   Tobacco Use  . Smoking status: Current Every Day Smoker    Packs/day: 3.00    Years: 15.00    Pack years: 45.00    Types: Cigarettes  . Smokeless tobacco: Former NeurosurgeonUser    Quit date: 11/22/2009  Substance Use Topics  . Alcohol use: Yes    Comment: a beer a month ago  . Drug use: Yes    Types: Marijuana     Allergies   Asa [aspirin]   Review of Systems Review of Systems  Constitutional: Negative for activity change, appetite change, chills, fever and unexpected weight change.  HENT: Negative for congestion, rhinorrhea and sore throat.   Eyes: Negative for visual disturbance.  Respiratory: Negative for cough and shortness of breath.   Cardiovascular: Negative for chest pain.  Gastrointestinal: Positive for abdominal pain, nausea and vomiting.  Negative for blood in stool, constipation, diarrhea and rectal pain.  Endocrine: Negative for polydipsia, polyphagia and polyuria.  Genitourinary: Negative for dysuria, flank pain, frequency, hematuria, penile pain, penile swelling, scrotal swelling and testicular pain.  Musculoskeletal: Negative for back pain.  Skin: Negative for rash.  Allergic/Immunologic: Negative for immunocompromised state.  Neurological: Negative for dizziness and weakness.  Psychiatric/Behavioral: The patient is not  nervous/anxious.      Physical Exam Updated Vital Signs BP 124/78   Pulse (!) 55   Temp 98.6 F (37 C) (Oral)   Resp 18   Ht 6\' 4"  (1.93 m)   Wt 88.5 kg   SpO2 100%   BMI 23.74 kg/m   Physical Exam Vitals signs and nursing note reviewed.  Constitutional:      General: He is not in acute distress.    Appearance: He is well-developed. He is not diaphoretic.  HENT:     Head: Normocephalic and atraumatic.     Mouth/Throat:     Mouth: Mucous membranes are dry.     Pharynx: Oropharynx is clear. Uvula midline. No pharyngeal swelling, oropharyngeal exudate, posterior oropharyngeal erythema or uvula swelling.  Eyes:     General: No scleral icterus.    Extraocular Movements: Extraocular movements intact.     Pupils: Pupils are equal, round, and reactive to light.  Neck:     Musculoskeletal: Normal range of motion and neck supple.  Cardiovascular:     Rate and Rhythm: Normal rate and regular rhythm.     Heart sounds: Normal heart sounds. No murmur. No friction rub. No gallop.   Pulmonary:     Effort: Pulmonary effort is normal. No respiratory distress.     Breath sounds: Normal breath sounds. No wheezing or rales.  Abdominal:     General: Bowel sounds are normal. There is no distension.     Palpations: Abdomen is soft. Abdomen is not rigid. There is no mass.     Tenderness: There is no abdominal tenderness (No abdominal pain upon palpation during my exam.). There is no right CVA tenderness, left CVA tenderness, guarding or rebound.     Hernia: No hernia is present.  Musculoskeletal: Normal range of motion.  Skin:    Findings: No rash.  Neurological:     Mental Status: He is alert and oriented to person, place, and time.      ED Treatments / Results  Labs (all labs ordered are listed, but only abnormal results are displayed) Labs Reviewed  COMPREHENSIVE METABOLIC PANEL - Abnormal; Notable for the following components:      Result Value   Glucose, Bld 108 (*)    BUN 30  (*)    Creatinine, Ser 1.33 (*)    Total Protein 8.9 (*)    All other components within normal limits  CBC - Abnormal; Notable for the following components:   RBC 6.39 (*)    Hemoglobin 12.6 (*)    MCV 67.9 (*)    MCH 19.7 (*)    MCHC 29.0 (*)    RDW 21.7 (*)    Platelets 420 (*)    All other components within normal limits  URINALYSIS, ROUTINE W REFLEX MICROSCOPIC - Abnormal; Notable for the following components:   Specific Gravity, Urine 1.034 (*)    Ketones, ur 20 (*)    Protein, ur 100 (*)    All other components within normal limits  LIPASE, BLOOD    EKG None  Radiology Kore[place] Abdomen Limited Ruq  Result Date:  07/16/2019 CLINICAL DATA:  Abdominal pain. EXAM: ULTRASOUND ABDOMEN LIMITED RIGHT UPPER QUADRANT COMPARISON:  Abdominal series 11/20/2011. FINDINGS: Gallbladder: No gallstones or wall thickening visualized. No sonographic Murphy sign noted by sonographer. Common bile duct: Diameter: 4.4 Liver: No focal lesion identified. Within normal limits in parenchymal echogenicity. Portal vein is patent on color Doppler imaging with normal direction of blood flow towards the liver. IMPRESSION: Negative exam.  No gallstones or biliary distention. Electronically Signed   By: Maisie Fushomas  Register   On: 07/16/2019 12:40    Procedures Procedures (including critical care time)  Medications Ordered in ED Medications  sodium chloride flush (NS) 0.9 % injection 3 mL (3 mLs Intravenous Given 07/16/19 1208)  sodium chloride 0.9 % bolus 1,000 mL (0 mLs Intravenous Stopped 07/16/19 1308)  ondansetron (ZOFRAN) injection 4 mg (4 mg Intravenous Given 07/16/19 1207)  sodium chloride 0.9 % bolus 1,000 mL (1,000 mLs Intravenous New Bag/Given 07/16/19 1430)  promethazine (PHENERGAN) injection 25 mg (25 mg Intravenous Given 07/16/19 1434)  alum & mag hydroxide-simeth (MAALOX/MYLANTA) 200-200-20 MG/5ML suspension 30 mL (30 mLs Oral Given 07/16/19 1435)    And  lidocaine (XYLOCAINE) 2 % viscous mouth solution 15  mL (15 mLs Oral Given 07/16/19 1436)     Initial Impression / Assessment and Plan / ED Course  I have reviewed the triage vital signs and the nursing notes.  Pertinent labs & imaging results that were available during my care of the patient were reviewed by me and considered in my medical decision making (see chart for details).  Clinical Course as of Jul 16 1607  Mon Jul 16, 2019  1233 Low hemoglobin noted at 12.6. This appears to have improved compared to previous values.   Hemoglobin(!): 12.6 [AH]  1247 Negative exam.  No gallstones or biliary distention.  US Abdomen Limited RUQ [AH]  1313 WBCs are within normal limits.   WBC: 9.1 [AH]  1313 CMP reveals mild hyperglycemia noted at 108, elevated BUN at 30, elevated creatinine at 1.33, and high total protein at 8.9. Liver enzymes are within normal limits.  Comprehensive metabolic panel(!) [AH]  1314 Lipase is within normal limits.  Lipase, blood [AH]  1359 Creatinine elevated at 1.33. IVF provided.  Creatinine(!): 1.33 [AH]  1406 Reassessed patient. Patient continues to endorse vomiting. Will provide additional antiemetics and IVF.   [AH]  1459 Reassessed patient. Patient states symptoms have significantly improved. Patient denies any further vomiting or abdominal pain.   [AH]    Clinical Course User Index [AH] Leretha DykesHernandez, Jaryn Hocutt P, PA-C      Patient presents with nausea, vomiting, and epigastric abdominal pain. Labs, vitals, and imaging reviewed. Provided IVF, GI cocktail, and antiemetics in the ER. Abdominal ultrasound is negative for gallstones or biliary distention. Abdomen is non tender during exam. Patient is nontoxic, nonseptic appearing, in no apparent distress.  Patient's pain and other symptoms adequately managed in emergency department. Patient does not meet the SIRS or Sepsis criteria.  On repeat exam patient does not have a surgical abdomin and there are no peritoneal signs.  No indication of appendicitis, bowel obstruction,  bowel perforation, cholecystitis, diverticulitis.  Patient discharged home with symptomatic treatment and given strict instructions for follow-up with their primary care physician.  I have also discussed reasons to return immediately to the ER.  Patient expresses understanding and agrees with plan.   Final Clinical Impressions(s) / ED Diagnoses   Final diagnoses:  Nausea and vomiting, intractability of vomiting not specified, unspecified vomiting type  Abdominal  pain, epigastric    ED Discharge Orders         Ordered    ondansetron (ZOFRAN ODT) 4 MG disintegrating tablet  Every 8 hours PRN     07/16/19 1608           Darlin Drop [place], [place] 07/16/19 1608    Charlesetta Shanks, MD 07/21/19 1100

## 2019-07-17 ENCOUNTER — Other Ambulatory Visit: Payer: Self-pay

## 2019-07-17 ENCOUNTER — Inpatient Hospital Stay (HOSPITAL_COMMUNITY)
Admission: EM | Admit: 2019-07-17 | Discharge: 2019-07-20 | DRG: 384 | Disposition: A | Discharge status: Home / Self Care | Payer: Self-pay | Attending: Internal Medicine | Admitting: Internal Medicine

## 2019-07-17 ENCOUNTER — Encounter (HOSPITAL_COMMUNITY): Payer: Self-pay | Admitting: Emergency Medicine

## 2019-07-17 ENCOUNTER — Emergency Department (HOSPITAL_COMMUNITY): Payer: Self-pay

## 2019-07-17 DIAGNOSIS — R112 Nausea with vomiting, unspecified: Secondary | ICD-10-CM

## 2019-07-17 DIAGNOSIS — K228 Other specified diseases of esophagus: Secondary | ICD-10-CM | POA: Diagnosis present

## 2019-07-17 DIAGNOSIS — R1013 Epigastric pain: Secondary | ICD-10-CM

## 2019-07-17 DIAGNOSIS — K259 Gastric ulcer, unspecified as acute or chronic, without hemorrhage or perforation: Principal | ICD-10-CM

## 2019-07-17 DIAGNOSIS — K210 Gastro-esophageal reflux disease with esophagitis: Secondary | ICD-10-CM | POA: Diagnosis present

## 2019-07-17 DIAGNOSIS — F17210 Nicotine dependence, cigarettes, uncomplicated: Secondary | ICD-10-CM | POA: Diagnosis present

## 2019-07-17 DIAGNOSIS — R911 Solitary pulmonary nodule: Secondary | ICD-10-CM

## 2019-07-17 DIAGNOSIS — F1290 Cannabis use, unspecified, uncomplicated: Secondary | ICD-10-CM

## 2019-07-17 DIAGNOSIS — Z20828 Contact with and (suspected) exposure to other viral communicable diseases: Secondary | ICD-10-CM | POA: Diagnosis present

## 2019-07-17 DIAGNOSIS — R6883 Chills (without fever): Secondary | ICD-10-CM | POA: Diagnosis present

## 2019-07-17 DIAGNOSIS — Z720 Tobacco use: Secondary | ICD-10-CM

## 2019-07-17 DIAGNOSIS — Z8249 Family history of ischemic heart disease and other diseases of the circulatory system: Secondary | ICD-10-CM

## 2019-07-17 DIAGNOSIS — D509 Iron deficiency anemia, unspecified: Secondary | ICD-10-CM

## 2019-07-17 DIAGNOSIS — Z8261 Family history of arthritis: Secondary | ICD-10-CM

## 2019-07-17 LAB — COMPREHENSIVE METABOLIC PANEL
ALT: 26 U/L (ref 0–44)
AST: 47 U/L — ABNORMAL HIGH (ref 15–41)
Albumin: 4.3 g/dL (ref 3.5–5.0)
Alkaline Phosphatase: 46 U/L (ref 38–126)
Anion gap: 11 (ref 5–15)
BUN: 26 mg/dL — ABNORMAL HIGH (ref 6–20)
CO2: 27 mmol/L (ref 22–32)
Calcium: 9.3 mg/dL (ref 8.9–10.3)
Chloride: 99 mmol/L (ref 98–111)
Creatinine, Ser: 1.26 mg/dL — ABNORMAL HIGH (ref 0.61–1.24)
GFR calc Af Amer: >60 mL/min (ref >60)
GFR calc non Af Amer: >60 mL/min (ref >60)
Glucose, Bld: 115 mg/dL — ABNORMAL HIGH (ref 70–99)
Potassium: 3.7 mmol/L (ref 3.5–5.1)
Sodium: 137 mmol/L (ref 135–145)
Total Bilirubin: 0.9 mg/dL (ref 0.3–1.2)
Total Protein: 8.2 g/dL — ABNORMAL HIGH (ref 6.5–8.1)

## 2019-07-17 LAB — CBC
HCT: 41.6 % (ref 39.0–52.0)
Hemoglobin: 11.9 g/dL — ABNORMAL LOW (ref 13.0–17.0)
MCH: 19.8 pg — ABNORMAL LOW (ref 26.0–34.0)
MCHC: 28.6 g/dL — ABNORMAL LOW (ref 30.0–36.0)
MCV: 69.1 fL — ABNORMAL LOW (ref 80.0–100.0)
Platelets: 406 10*3/uL — ABNORMAL HIGH (ref 150–400)
RBC: 6.02 MIL/uL — ABNORMAL HIGH (ref 4.22–5.81)
RDW: 21.4 % — ABNORMAL HIGH (ref 11.5–15.5)
WBC: 10.8 10*3/uL — ABNORMAL HIGH (ref 4.0–10.5)
nRBC: 0 % (ref 0.0–0.2)

## 2019-07-17 LAB — SARS CORONAVIRUS 2 BY RT PCR (HOSPITAL ORDER, PERFORMED IN ~~LOC~~ HOSPITAL LAB): SARS Coronavirus 2: NEGATIVE

## 2019-07-17 LAB — LIPASE, BLOOD: Lipase: 28 U/L (ref 11–51)

## 2019-07-17 LAB — POC OCCULT BLOOD, ED: Fecal Occult Bld: NEGATIVE

## 2019-07-17 MED ORDER — SODIUM CHLORIDE 0.9 % IV SOLN
8.0000 mg/h | INTRAVENOUS | Status: DC
  Administered 2019-07-18 – 2019-07-19 (×4): 8 mg/h via INTRAVENOUS
  Filled 2019-07-17 (×7): qty 80

## 2019-07-17 MED ORDER — SODIUM CHLORIDE 0.9 % IV BOLUS
1000.0000 mL | Freq: Once | INTRAVENOUS | Status: AC
  Administered 2019-07-17: 1000 mL via INTRAVENOUS

## 2019-07-17 MED ORDER — PANTOPRAZOLE SODIUM 40 MG IV SOLR
40.0000 mg | Freq: Once | INTRAVENOUS | Status: AC
  Administered 2019-07-18: 40 mg via INTRAVENOUS
  Filled 2019-07-17: qty 40

## 2019-07-17 MED ORDER — PROCHLORPERAZINE EDISYLATE 10 MG/2ML IJ SOLN
10.0000 mg | Freq: Once | INTRAMUSCULAR | Status: AC
  Administered 2019-07-17: 10 mg via INTRAVENOUS
  Filled 2019-07-17: qty 2

## 2019-07-17 MED ORDER — SODIUM CHLORIDE 0.9 % IV SOLN
INTRAVENOUS | Status: DC
  Administered 2019-07-18: 01:00:00 via INTRAVENOUS

## 2019-07-17 MED ORDER — ONDANSETRON HCL 4 MG PO TABS: 4.0000 mg | ORAL_TABLET | Freq: Four times a day (QID) | ORAL | Status: DC | PRN

## 2019-07-17 MED ORDER — SODIUM CHLORIDE 0.9% FLUSH
3.0000 mL | Freq: Once | INTRAVENOUS | Status: AC
  Administered 2019-07-17: 3 mL via INTRAVENOUS

## 2019-07-17 MED ORDER — METOCLOPRAMIDE HCL 5 MG/ML IJ SOLN
10.0000 mg | Freq: Once | INTRAMUSCULAR | Status: AC
  Administered 2019-07-17: 10 mg via INTRAVENOUS
  Filled 2019-07-17: qty 2

## 2019-07-17 MED ORDER — NICOTINE 21 MG/24HR TD PT24
21.0000 mg | MEDICATED_PATCH | Freq: Every day | TRANSDERMAL | Status: DC
  Administered 2019-07-18 – 2019-07-20 (×3): 21 mg via TRANSDERMAL
  Filled 2019-07-17 (×3): qty 1

## 2019-07-17 MED ORDER — SODIUM CHLORIDE 0.9 % IV SOLN
80.0000 mg | Freq: Once | INTRAVENOUS | Status: AC
  Administered 2019-07-18: 80 mg via INTRAVENOUS
  Filled 2019-07-17: qty 80

## 2019-07-17 MED ORDER — IOHEXOL 300 MG/ML  SOLN
100.0000 mL | Freq: Once | INTRAMUSCULAR | Status: AC | PRN
  Administered 2019-07-17: 100 mL via INTRAVENOUS

## 2019-07-17 MED ORDER — ONDANSETRON HCL 4 MG/2ML IJ SOLN
4.0000 mg | Freq: Four times a day (QID) | INTRAMUSCULAR | Status: DC | PRN
  Administered 2019-07-18: 4 mg via INTRAVENOUS

## 2019-07-17 MED ORDER — PROMETHAZINE HCL 25 MG/ML IJ SOLN
12.5000 mg | Freq: Once | INTRAMUSCULAR | Status: AC
  Administered 2019-07-17: 12.5 mg via INTRAVENOUS
  Filled 2019-07-17: qty 1

## 2019-07-17 MED ORDER — DIPHENHYDRAMINE HCL 50 MG/ML IJ SOLN
25.0000 mg | Freq: Once | INTRAMUSCULAR | Status: AC
  Administered 2019-07-17: 20:00:00 25 mg via INTRAVENOUS
  Filled 2019-07-17: qty 1

## 2019-07-17 MED ORDER — SODIUM CHLORIDE (PF) 0.9 % IJ SOLN
INTRAMUSCULAR | Status: AC
  Administered 2019-07-17: 22:00:00
  Filled 2019-07-17: qty 50

## 2019-07-17 MED ORDER — ACETAMINOPHEN 650 MG RE SUPP: 650.0000 mg | Freq: Four times a day (QID) | RECTAL | Status: DC | PRN

## 2019-07-17 MED ORDER — ACETAMINOPHEN 325 MG PO TABS: 650.0000 mg | ORAL_TABLET | Freq: Four times a day (QID) | ORAL | Status: DC | PRN

## 2019-07-17 NOTE — ED Notes (Signed)
Pt given call bell and knows how to call staff if assistance is needed. Pt denies needing anything at this time.

## 2019-07-17 NOTE — ED Triage Notes (Addendum)
Per EMS, history of N/V since Thursday-was here for the same symptoms last week-was given Zofran but couldn't keep it down-4 mg of Zofran and 300 cc of NS given in route-

## 2019-07-17 NOTE — ED Notes (Signed)
Pt's visitor (girlfriend) continually coming out of room and coming to nurses station. This Probation officer helped visitor understand how to utilize the call bell if assistance was needed, as Probation officer did for the pt previously. Writer also explained to pt and visitor the current visitor policy about leaving the room and wandering in the hallways. Visitor agreed to stay in the room and not walk around in the hallways.

## 2019-07-17 NOTE — H&P (Signed)
History and Physical    Eman Morimoto BMS:[id number] DOB: 11/07/1976 DOA: 07/17/2019  PCP: Patient, No Pcp Per  Patient coming from: Home  I have personally briefly reviewed patient's old medical records in [place]  Chief Complaint: Nausea and vomiting  HPI: William Horne is a 43 y.o. male with medical history significant for gastritis, reported history of hepatitis C, tobacco use, and marijuana use with suspected history of cannabinoid hyperemesis syndrome who presents to the ED for evaluation of persistent nausea and vomiting.  Patient states symptoms began 4 days ago and he has been unable to maintain any adequate oral intake.  He reports mostly clear emesis but has seen occasional streaks of blood or brown-colored emesis.  He denies any coffee-ground appearance.  He has intermittent abdominal pain associated with his vomiting episodes.  He reports seeing dark black-colored stool without bright red blood.  He reports occasional chills.  He denies any subjective fevers, chest pain, dyspnea, or dysuria.  Patient reports currently smoking about 1 PPD.  He reports marijuana use about 3 times a week.  He denies any alcohol, cocaine, or IV drug use.  He denies any recent NSAID use.  ED Course:  Initial vitals showed BP 127/82, pulse 63, RR 14, temp 99.0 Fahrenheit, SPO2 99% on room air.  Labs are notable for WBC 10.8, hemoglobin 11.9, MCV 69.1, platelets 406,000, potassium 3.7, BUN 26, creatinine 1.26 (baseline 1.2-1.3), AST 47, ALT 26, alk phos 46, total bilirubin 0.9, lipase 28.  Acute hepatitis panel was ordered and pending.  FOBT was negative.  Patient was given IV Compazine, Reglan, Phenergan, and Benadryl with continued nausea and vomiting.  He was also given 1 L normal saline.  EDP discussed with GI who will see in a.m. patient was started on IV Protonix bolus followed by continuous infusion and the hospitalist service was consulted to admit for further evaluation and  management.  Review of Systems: All systems reviewed and are negative except as documented in history of present illness above.   Past Medical History:  Diagnosis Date  . Dental caries   . Eczema   . Gastritis   . Hepatitis C   . Shoulder dislocation     History reviewed. No pertinent surgical history.  Social History:  reports that he has been smoking cigarettes. He has a 45.00 pack-year smoking history. He quit smokeless tobacco use about 9 years ago. He reports current alcohol use. He reports current drug use. Drug: Marijuana.  Allergies  Allergen Reactions  . Asa [Aspirin] Nausea And Vomiting    Family History  Problem Relation Age of Onset  . Rheum arthritis Mother   . Osteoarthritis Mother   . Hypertension Father   . Migraines Sister      Prior to Admission medications   Medication Sig Start Date End Date Taking? Authorizing Provider  bismuth subsalicylate (PEPTO BISMOL) 262 MG/15ML suspension Take 30 mLs by mouth every 6 (six) hours as needed for indigestion or diarrhea or loose stools.   Yes [provider]  ondansetron (ZOFRAN ODT) 4 MG disintegrating tablet Take 1 tablet (4 mg total) by mouth every 8 (eight) hours as needed for nausea or vomiting. 07/16/19  Yes Jerilee Hoh, Ana P, PA-C  omeprazole (PRILOSEC) 20 MG capsule Take 1 capsule (20 mg total) by mouth daily. Patient not taking: Reported on 07/16/2019 01/31/16   Leo Grosser, MD  penicillin v potassium (VEETID) 500 MG tablet Take 1 tablet (500 mg total) by mouth 3 (three)  times daily. Patient not taking: Reported on 01/31/2016 01/20/16   Domenic Moras, PA-C  promethazine (PHENERGAN) 25 MG suppository Place 1 suppository (25 mg total) rectally every 6 (six) hours as needed for nausea or vomiting. Patient not taking: Reported on 07/16/2019 01/21/16   Deno Etienne, DO  ranitidine (ZANTAC) 150 MG tablet Take 1 tablet (150 mg total) by mouth daily. Patient not taking: Reported on 07/16/2019 01/20/16   Domenic Moras,  PA-C    Physical Exam: Vitals:   07/17/19 2049 07/17/19 2130 07/17/19 2211 07/17/19 2300  BP: 110/76 123/77 116/76 121/76  Pulse: 74 69 87 67  Resp: '16 15 11 17  ' Temp:      TempSrc:      SpO2: 100% 100% 100% 100%  Weight:      Height:        Constitutional: Resting in bed in the left lateral decubitus position, NAD, calm, comfortable Eyes: PERRL, lids and conjunctivae normal ENMT: Mucous membranes are dry. Posterior pharynx clear of any exudate or lesions.Normal dentition.  Neck: normal, supple, no masses. Respiratory: clear to auscultation bilaterally, no wheezing, no crackles. Normal respiratory effort. No accessory muscle use.  Cardiovascular: Regular rate and rhythm, no murmurs / rubs / gallops. No extremity edema. 2+ pedal pulses. Abdomen: Mild epigastric and bilateral upper quadrant tenderness, no masses palpated. No hepatosplenomegaly. Bowel sounds positive.  Musculoskeletal: no clubbing / cyanosis. No joint deformity upper and lower extremities. Good ROM, no contractures. Normal muscle tone.  Skin: no rashes, lesions, ulcers. No induration Neurologic: CN 2-12 grossly intact. Sensation intact,Strength 5/5 in all 4.  Psychiatric: Normal judgment and insight. Alert and oriented x 3. Normal mood.     Labs on Admission: I have personally reviewed following labs and imaging studies  CBC: Recent Labs  Lab 07/16/19 1215 07/17/19 1752  WBC 9.1 10.8*  HGB 12.6* 11.9*  HCT 43.4 41.6  MCV 67.9* 69.1*  PLT 420* 008*   Basic Metabolic Panel: Recent Labs  Lab 07/16/19 1215 07/17/19 1752  NA 137 137  K 3.7 3.7  CL 99 99  CO2 25 27  GLUCOSE 108* 115*  BUN 30* 26*  CREATININE 1.33* 1.26*  CALCIUM 9.5 9.3   GFR: Estimated Creatinine Clearance: 93.8 mL/min (A) (by C-G formula based on SCr of 1.26 mg/dL (H)). Liver Function Tests: Recent Labs  Lab 07/16/19 1215 07/17/19 1752  AST 36 47*  ALT 21 26  ALKPHOS 48 46  BILITOT 1.1 0.9  PROT 8.9* 8.2*  ALBUMIN 4.5 4.3    Recent Labs  Lab 07/16/19 1215 07/17/19 1752  LIPASE 24 28   No results for input(s): AMMONIA in the last 168 hours. Coagulation Profile: No results for input(s): INR, PROTIME in the last 168 hours. Cardiac Enzymes: No results for input(s): CKTOTAL, CKMB, CKMBINDEX, TROPONINI in the last 168 hours. BNP (last 3 results) No results for input(s): PROBNP in the last 8760 hours. HbA1C: No results for input(s): HGBA1C in the last 72 hours. CBG: No results for input(s): GLUCAP in the last 168 hours. Lipid Profile: No results for input(s): CHOL, HDL, LDLCALC, TRIG, CHOLHDL, LDLDIRECT in the last 72 hours. Thyroid Function Tests: No results for input(s): TSH, T4TOTAL, FREET4, T3FREE, THYROIDAB in the last 72 hours. Anemia Panel: No results for input(s): VITAMINB12, FOLATE, FERRITIN, TIBC, IRON, RETICCTPCT in the last 72 hours. Urine analysis:    Component Value Date/Time   COLORURINE YELLOW 07/16/2019 1136   APPEARANCEUR CLEAR 07/16/2019 1136   LABSPEC 1.034 (H) 07/16/2019 1136  PHURINE 6.0 07/16/2019 1136   GLUCOSEU NEGATIVE 07/16/2019 1136   HGBUR NEGATIVE 07/16/2019 1136   BILIRUBINUR NEGATIVE 07/16/2019 1136   KETONESUR 20 (A) 07/16/2019 1136   PROTEINUR 100 (A) 07/16/2019 1136   UROBILINOGEN 1.0 11/23/2011 2221   NITRITE NEGATIVE 07/16/2019 1136   LEUKOCYTESUR NEGATIVE 07/16/2019 1136    Radiological Exams on Admission: Ct Abdomen Pelvis W Contrast  Result Date: 07/17/2019 CLINICAL DATA:  Nausea/vomiting, epigastric abdominal pain EXAM: CT ABDOMEN AND PELVIS WITH CONTRAST TECHNIQUE: Multidetector CT imaging of the abdomen and pelvis was performed using the standard protocol following bolus administration of intravenous contrast. CONTRAST:  132m OMNIPAQUE IOHEXOL 300 MG/ML  SOLN COMPARISON:  None. FINDINGS: Lower chest: 11 x 7 x 14 mm irregular nodule in the left lower lobe (series 4/image 13), indeterminate. Hepatobiliary: Liver is within normal limits. Gallbladder is  unremarkable. No intrahepatic or extrahepatic ductal dilatation. Pancreas: Within normal limits. Spleen: Within normal limits. Adrenals/Urinary Tract: Adrenal glands are within normal limits. Kidneys are within normal limits.  No hydronephrosis. Bladder is within normal limits. Stomach/Bowel: Prominent rugal folds, nonspecific. Mild gastric wall edema along the lesser curvature with a suspected gastric ulcer (series 2/image 26). No associated free air to suggest perforation. No evidence of bowel obstruction. Normal appendix (series 2/image 55). Vascular/Lymphatic: No evidence of abdominal aortic aneurysm. Mild atherosclerotic calcifications the abdominal aorta. No suspicious abdominopelvic lymphadenopathy. Reproductive: Prostate is unremarkable. Other: No abdominopelvic ascites. Musculoskeletal: Degenerative changes at L5-S1. IMPRESSION: Suspected gastric ulcer with surrounding gastric wall edema along the lesser curvature of the stomach. No associated free air to suggest perforation. Consider endoscopy for further evaluation. 14 mm irregular left lower lobe nodule, indeterminate. No prior imaging is available for comparison, and therefore infection/inflammation remains within the differential. Consider short-term follow-up CT chest in 4-6 weeks to assess for persistence. If present at that time, this would be concerning for malignancy, and PET-CT would be the appropriate next step. These results were called by telephone at the time of interpretation on 07/17/2019 at 10:30 pm to [facility] PA, who verbally acknowledged these results. Electronically Signed   By: SJulian HyM.D.   On: 07/17/2019 22:32   [place]dom[place] Limited Ruq  Result Date: 07/16/2019 CLINICAL DATA:  Abdominal pain. EXAM: ULTRASOUND ABDOMEN LIMITED RIGHT UPPER QUADRANT COMPARISON:  Abdominal series 11/20/2011. FINDINGS: Gallbladder: No gallstones or wall thickening visualized. No sonographic Murphy sign noted by sonographer. Common bile  duct: Diameter: 4.4 Liver: No focal lesion identified. Within normal limits in parenchymal echogenicity. Portal vein is patent on color Doppler imaging with normal direction of blood flow towards the liver. IMPRESSION: Negative exam.  No gallstones or biliary distention. Electronically Signed   By: TMarcello Moores Register   On: 07/16/2019 12:40    EKG: Independently reviewed. Sinus rhythm without acute ischemic changes.  Assessment/Plan Principal Problem:   Gastric ulcer, unspecified as acute or chronic, without hemorrhage or perforation Active Problems:   Tobacco use   Marijuana use   Nausea and vomiting   Microcytic anemia  RCrewe Heathmanis a 43y.o. male with medical history significant for gastritis, reported history of hepatitis C, tobacco use, and marijuana use with suspected history of cannabinoid hyperemesis syndrome who is admitted with persistent nausea vomiting and found to have a gastric ulcer on CT imaging.   Gastric ulcer and gastritis: Seen on CT imaging without associated free air to suggest perforation.  Reports dark black appearing stools, however FOBT was negative in the ED.  He denies use of  oral iron supplementation.  May need further evaluation with upper endoscopy and testing for H. pylori.  Patient has had not had any diarrhea, will discontinue GI pathogen panel. -Continue IV Protonix infusion -Avoid NSAIDs -GI consulted, to see in a.m. -Will keep n.p.o.  Nausea and vomiting: Continued nausea and vomiting likely from above and possibly cannabinoid hyperemesis syndrome. -Continue supportive care with IV fluids while n.p.o. -Continue antiemetics as needed  Microcytic anemia: Hemoglobin stable at 11.9.  Patient has chronic microcytosis. -Check ferritin  Tobacco use: Smoking 1 PPD.  Nicotine patch ordered.   DVT prophylaxis: SCDs Code Status: Full code, confirmed with patient Family Communication: Discussed with patient, he has discussed with family.  Disposition Plan: Pending clinical progress Consults called: GI consulted by EDP, to see in a.m. Admission status: Admit - It is my clinical opinion that admission to INPATIENT is reasonable and necessary because of the expectation that this patient will require hospital care that crosses at least 2 midnights to treat this condition based on the medical complexity of the problems presented.  Given the aforementioned information, the predictability of an adverse outcome is felt to be significant.    Zada Finders MD [facility]  If 7PM-7AM, please contact night-coverage [email or web address]  07/17/2019, 11:54 PM

## 2019-07-17 NOTE — ED Notes (Signed)
POC occult card at bedside. 

## 2019-07-17 NOTE — ED Notes (Addendum)
Patient reminded that urine sample is needed. Urinal at bedside.  

## 2019-07-17 NOTE — ED Provider Notes (Signed)
[facility]-EMERGENCY DEPT Provider Note   CSN: [id number] Arrival date & time: 07/17/19  1430    History   Chief Complaint Chief Complaint  Patient presents with   Abdominal Pain    HPI Cathe MonsRobert Leon Mullenax is a 43 y.o. male with h/o gastritis, tobacco use, marijuana use presents to ER for evaluation of recurrent, persistent, nausea associated with vomiting and abdominal pain.  Onset on Thursday.  Wife actually clarifies pt has had cyclical nausea, vomiting, abdominal pain for "years".  Symptoms will flare up randomly and he will go days with nausea, vomiting. Most of the time symptoms resolve on their own. Has had several previous ER visits for similar.  He came to ER yesterday and had blood work, urine, RUQ US and given medicines. He stopped vomiting and was discharged. Wife states 20 mins after he arrived home he began vomiting again. He took 2 OTD zofran without relief.  Initially emesis looked black but now it is yellow, more than 10 in the last 24 hours, forceful.  He can't keep anything down, has not eaten in 2 days.  Has had associated generalized weakness, cramping in his toes. Feels dehydrated.  His stools have looked black in the past. He works at [employer] and remembers the night before symptoms started he came in contact with batter that had been used to prep raw chicken and a drop went into his mouth. His boss told him to get checked for salmonella. He has not had any diarrhea.  Last Bm normal this morning. Wife at bedside is worried he has hep C. States she has untreated hep C for several years and sometimes she has similar symptoms and would like for him to be tested as well.  He smokes marijuana every other day. No significant or frequent ETOH use. Does not take NSAID or aspirin due to h/o gastritis in the past.   No fever, CP, SOB, diarrhea, dysuria. No h/o abd surgeries.       HPI  Past Medical History:  Diagnosis Date   Dental caries    Eczema      Gastritis    Hepatitis C    Shoulder dislocation     Patient Active Problem List   Diagnosis Date Noted   Gastric ulcer, unspecified as acute or chronic, without hemorrhage or perforation 07/17/2019   Tobacco use 07/17/2019   Marijuana use 07/17/2019   Nausea and vomiting 07/17/2019   Microcytic anemia 07/17/2019    History reviewed. No pertinent surgical history.      Home Medications    Prior to Admission medications   Medication Sig Start Date End Date Taking? Authorizing Provider  bismuth subsalicylate (PEPTO BISMOL) 262 MG/15ML suspension Take 30 mLs by mouth every 6 (six) hours as needed for indigestion or diarrhea or loose stools.   Yes [provider]  ondansetron (ZOFRAN ODT) 4 MG disintegrating tablet Take 1 tablet (4 mg total) by mouth every 8 (eight) hours as needed for nausea or vomiting. 07/16/19  Yes Ardyth HarpsHernandez, Ana P, PA-C  omeprazole (PRILOSEC) 20 MG capsule Take 1 capsule (20 mg total) by mouth daily. Patient not taking: Reported on 07/16/2019 01/31/16   Lyndal PulleyKnott, Daniel, MD  penicillin v potassium (VEETID) 500 MG tablet Take 1 tablet (500 mg total) by mouth 3 (three) times daily. Patient not taking: Reported on 01/31/2016 01/20/16   Fayrene Helperran, Bowie, PA-C  promethazine (PHENERGAN) 25 MG suppository Place 1 suppository (25 mg total) rectally every 6 (six) hours as needed  for nausea or vomiting. Patient not taking: Reported on 07/16/2019 01/21/16   Melene PlanFloyd, Dan, DO  ranitidine (ZANTAC) 150 MG tablet Take 1 tablet (150 mg total) by mouth daily. Patient not taking: Reported on 07/16/2019 01/20/16   Fayrene Helperran, Bowie, PA-C    Family History Family History  Problem Relation Age of Onset   Rheum arthritis Mother    Osteoarthritis Mother    Hypertension Father    Migraines Sister     Social History Social History   Tobacco Use   Smoking status: Current Every Day Smoker    Packs/day: 3.00    Years: 15.00    Pack years: 45.00    Types: Cigarettes    Smokeless tobacco: Former NeurosurgeonUser    Quit date: 11/22/2009  Substance Use Topics   Alcohol use: Yes    Comment: a beer a month ago   Drug use: Yes    Types: Marijuana     Allergies   Asa [aspirin]   Review of Systems Review of Systems  Constitutional: Positive for chills.  Gastrointestinal: Positive for abdominal pain, nausea and vomiting.       Black stools   Musculoskeletal:       Cramps   Neurological: Positive for weakness (generalized ).     Physical Exam Updated Vital Signs BP 116/76 (BP Location: Right Arm)    Pulse 87    Temp 98.8 F (37.1 C) (Oral)    Resp 11    Ht 6\' 4"  (1.93 m)    Wt 88.5 kg    SpO2 100%    BMI 23.74 kg/m   Physical Exam Vitals signs and nursing note reviewed.  Constitutional:      Appearance: He is well-developed.     Comments: Non toxic.  HENT:     Head: Normocephalic and atraumatic.     Nose: Nose normal.  Eyes:     Conjunctiva/sclera: Conjunctivae normal.  Neck:     Musculoskeletal: Normal range of motion.  Cardiovascular:     Rate and Rhythm: Normal rate and regular rhythm.     Heart sounds: Normal heart sounds.  Pulmonary:     Effort: Pulmonary effort is normal.     Breath sounds: Normal breath sounds.  Abdominal:     General: Bowel sounds are normal.     Palpations: Abdomen is soft.     Tenderness: There is abdominal tenderness in the epigastric area and left upper quadrant.     Comments: No G/R/R. No suprapubic or CVA tenderness. Negative Murphy's and McBurney's  Musculoskeletal: Normal range of motion.  Skin:    General: Skin is warm and dry.     Capillary Refill: Capillary refill takes less than 2 seconds.  Neurological:     Mental Status: He is alert.  Psychiatric:        Behavior: Behavior normal.      ED Treatments / Results  Labs (all labs ordered are listed, but only abnormal results are displayed) Labs Reviewed  COMPREHENSIVE METABOLIC PANEL - Abnormal; Notable for the following components:      Result  Value   Glucose, Bld 115 (*)    BUN 26 (*)    Creatinine, Ser 1.26 (*)    Total Protein 8.2 (*)    AST 47 (*)    All other components within normal limits  CBC - Abnormal; Notable for the following components:   WBC 10.8 (*)    RBC 6.02 (*)    Hemoglobin 11.9 (*)  MCV 69.1 (*)    MCH 19.8 (*)    MCHC 28.6 (*)    RDW 21.4 (*)    Platelets 406 (*)    All other components within normal limits  GASTROINTESTINAL PANEL BY PCR, STOOL (REPLACES STOOL CULTURE)  SARS CORONAVIRUS 2 (HOSPITAL ORDER, PERFORMED IN [facility])  LIPASE, BLOOD  URINALYSIS, ROUTINE W REFLEX MICROSCOPIC  HEPATITIS PANEL, ACUTE  RAPID URINE DRUG SCREEN, HOSP PERFORMED  POC OCCULT BLOOD, ED    EKG None  Radiology Ct Abdomen Pelvis W Contrast  Result Date: 07/17/2019 CLINICAL DATA:  Nausea/vomiting, epigastric abdominal pain EXAM: CT ABDOMEN AND PELVIS WITH CONTRAST TECHNIQUE: Multidetector CT imaging of the abdomen and pelvis was performed using the standard protocol following bolus administration of intravenous contrast. CONTRAST:  100mL OMNIPAQUE IOHEXOL 300 MG/ML  SOLN COMPARISON:  None. FINDINGS: Lower chest: 11 x 7 x 14 mm irregular nodule in the left lower lobe (series 4/image 13), indeterminate. Hepatobiliary: Liver is within normal limits. Gallbladder is unremarkable. No intrahepatic or extrahepatic ductal dilatation. Pancreas: Within normal limits. Spleen: Within normal limits. Adrenals/Urinary Tract: Adrenal glands are within normal limits. Kidneys are within normal limits.  No hydronephrosis. Bladder is within normal limits. Stomach/Bowel: Prominent rugal folds, nonspecific. Mild gastric wall edema along the lesser curvature with a suspected gastric ulcer (series 2/image 26). No associated free air to suggest perforation. No evidence of bowel obstruction. Normal appendix (series 2/image 55). Vascular/Lymphatic: No evidence of abdominal aortic aneurysm. Mild atherosclerotic calcifications the  abdominal aorta. No suspicious abdominopelvic lymphadenopathy. Reproductive: Prostate is unremarkable. Other: No abdominopelvic ascites. Musculoskeletal: Degenerative changes at L5-S1. IMPRESSION: Suspected gastric ulcer with surrounding gastric wall edema along the lesser curvature of the stomach. No associated free air to suggest perforation. Consider endoscopy for further evaluation. 14 mm irregular left lower lobe nodule, indeterminate. No prior imaging is available for comparison, and therefore infection/inflammation remains within the differential. Consider short-term follow-up CT chest in 4-6 weeks to assess for persistence. If present at that time, this would be concerning for malignancy, and PET-CT would be the appropriate next step. These results were called by telephone at the time of interpretation on 07/17/2019 at 10:30 pm to [facility] Garrick Midgley, PA, who verbally acknowledged these results. Electronically Signed   By: Charline BillsSriyesh  Krishnan M.D.   On: 07/17/2019 22:32   [place] Abdomen Limited Ruq  Result Date: 07/16/2019 CLINICAL DATA:  Abdominal pain. EXAM: ULTRASOUND ABDOMEN LIMITED RIGHT UPPER QUADRANT COMPARISON:  Abdominal series 11/20/2011. FINDINGS: Gallbladder: No gallstones or wall thickening visualized. No sonographic Murphy sign noted by sonographer. Common bile duct: Diameter: 4.4 Liver: No focal lesion identified. Within normal limits in parenchymal echogenicity. Portal vein is patent on color Doppler imaging with normal direction of blood flow towards the liver. IMPRESSION: Negative exam.  No gallstones or biliary distention. Electronically Signed   By: Maisie Fushomas  Register   On: 07/16/2019 12:40    Procedures .Critical Care Performed by: Liberty HandyGibbons, Derrisha Foos J, PA-C Authorized by: Liberty HandyGibbons, Chisum Habenicht J, PA-C   Critical care provider statement:    Critical care time (minutes):  45   Critical care was necessary to treat or prevent imminent or life-threatening deterioration of the following conditions:  intractable nausea, vomiting failed multiple antiemetics, required reassessements, admission, imaging.   Critical care was time spent personally by me on the following activities:  Discussions with consultants, evaluation of patient's response to treatment, examination of patient, ordering and performing treatments and interventions, ordering and review of laboratory studies, ordering and review of  radiographic studies, pulse oximetry, re-evaluation of patient's condition, obtaining history from patient or surrogate and review of old charts   (including critical care time)  Medications Ordered in ED Medications  pantoprazole (PROTONIX) injection 40 mg (has no administration in time range)  pantoprazole (PROTONIX) 80 mg in sodium chloride 0.9 % 100 mL IVPB (has no administration in time range)  pantoprazole (PROTONIX) 80 mg in sodium chloride 0.9 % 250 mL (0.32 mg/mL) infusion (has no administration in time range)  sodium chloride flush (NS) 0.9 % injection 3 mL (3 mLs Intravenous Given 07/17/19 1949)  sodium chloride 0.9 % bolus 1,000 mL (0 mLs Intravenous Stopped 07/17/19 2146)  metoCLOPramide (REGLAN) injection 10 mg (10 mg Intravenous Given 07/17/19 1950)  diphenhydrAMINE (BENADRYL) injection 25 mg (25 mg Intravenous Given 07/17/19 1949)  prochlorperazine (COMPAZINE) injection 10 mg (10 mg Intravenous Given 07/17/19 2047)  promethazine (PHENERGAN) injection 12.5 mg (12.5 mg Intravenous Given 07/17/19 2046)  iohexol (OMNIPAQUE) 300 MG/ML solution 100 mL (100 mLs Intravenous Contrast Given 07/17/19 2157)  sodium chloride (PF) 0.9 % injection (  Given by Other 07/17/19 2157)     Initial Impression / Assessment and Plan / ED Course  I have reviewed the triage vital signs and the nursing notes.  Pertinent labs & imaging results that were available during my care of the patient were reviewed by me and considered in my medical decision making (see chart for details).  Clinical Course as of Jul 16 2318    Tue Jul 17, 2019  2004 RN notified me pt just vomiting again 15 min after reglan/benadryl. Will repeat antiemetics    [CG]  2100 Pt re-evaluated. Somnolent after last anti-emetics. Arousable and able to cooperate during exam reports no more nausea, vomiting abdominal pain. No more emesis since initial time. Wife concerned that pt will throw up again if he moves around and wakes up. Will have RN ambulate and do fluid challenge. Repeat abd exam significantly improved no more tenderness.   [CG]  2148 I was notified pt vomited again. Will get CTAP, reorder antiemetics. Will check EKG for Qtc.   [CG]  2242 Suspected gastric ulcer with surrounding gastric wall edema along the lesser curvature of the stomach. No associated free air to suggest perforation. Consider endoscopy for further evaluation.  14 mm irregular left lower lobe nodule, indeterminate. No prior imaging is available for comparison, and therefore infection/inflammation remains within the differential. Consider short-term follow-up CT chest in 4-6 weeks to assess for persistence. If present at that time, this would be concerning for malignancy, and PET-CT would be the appropriate next step.  CT ABDOMEN PELVIS W CONTRAST [CG]  2300 Spoke to Dr Posey Pronto who will accept patient, requests GI consult. Pending.    [CG]    Clinical Course User Index [CG] Kinnie Feil, PA-C   ddx includes GERD vs gastritis vs PUD vs cannabanoid hyperemesis syndrome vs viral process. Given cyclical symptoms for years, previous h/o gastritis ?PUD, exam, labs today and normal lipase low suspicion for pancreatitis, cholecystitis.  Doubt diverticulitis. Had RUQ [place] yesterday in ER which I reviewed this was WNL.   ER work up today reviewed by me remarkable for mild leukocytosis likely from acute phase reactant, vomiting. Creatinine 1.26 at baseline. He reported black stools but heme occult negative here. Hgb stable.  CTAP obtained due to persistent intractable n/v  in ER. This showed possible area of gastric ulcer but no perforation. Incidentally found LLL nodule - this was discussed and explained to  patient. Repeat CT recommended, pt aware.   Patient has been re-evaluated several times without improvement in nausea, vomiting, abdominal pain.   Pt admitted by medicine team for refractory nausea, vomiting.  GI consulted Dr Evette Cristal who does not recommend any further emergent intervention tonight, GI will see in AM.  Final Clinical Impressions(s) / ED Diagnoses   Final diagnoses:  Intractable nausea and vomiting  Epigastric abdominal pain  Nodule of left lung    ED Discharge Orders    None       Liberty Handy, PA-C 07/17/19 2319    Alvira Monday, MD 07/19/19 [phone]

## 2019-07-17 NOTE — ED Notes (Signed)
Pt has urinal at bedside and knows to call staff if able to provide specimen.

## 2019-07-17 NOTE — ED Notes (Signed)
Patient vomiting despite medication administration. EDP made aware. Will continue to monitor patient.

## 2019-07-17 NOTE — ED Notes (Signed)
Pt sitting in bed vomitting from water that girlfriend gave pt.  Pt willing to ambulate once nausea calms down.

## 2019-07-17 NOTE — ED Notes (Signed)
Patient transported to CT 

## 2019-07-18 ENCOUNTER — Inpatient Hospital Stay (HOSPITAL_COMMUNITY): Payer: Self-pay | Admitting: Anesthesiology

## 2019-07-18 ENCOUNTER — Encounter (HOSPITAL_COMMUNITY): Payer: Self-pay | Admitting: Anesthesiology

## 2019-07-18 ENCOUNTER — Encounter (HOSPITAL_COMMUNITY)
Admission: EM | Disposition: A | Discharge status: Home / Self Care | Payer: Self-pay | Source: Home / Self Care | Attending: Internal Medicine

## 2019-07-18 DIAGNOSIS — K253 Acute gastric ulcer without hemorrhage or perforation: Secondary | ICD-10-CM

## 2019-07-18 HISTORY — PX: ESOPHAGOGASTRODUODENOSCOPY (EGD) WITH PROPOFOL: SHX5813

## 2019-07-18 HISTORY — PX: BIOPSY: SHX5522

## 2019-07-18 LAB — RAPID URINE DRUG SCREEN, HOSP PERFORMED
Amphetamines: NOT DETECTED
Barbiturates: NOT DETECTED
Benzodiazepines: NOT DETECTED
Cocaine: NOT DETECTED
Opiates: NOT DETECTED
Tetrahydrocannabinol: POSITIVE — AB

## 2019-07-18 LAB — URINALYSIS, ROUTINE W REFLEX MICROSCOPIC
Bacteria, UA: NONE SEEN
Bilirubin Urine: NEGATIVE
Glucose, UA: NEGATIVE mg/dL
Hgb urine dipstick: NEGATIVE
Ketones, ur: 5 mg/dL — AB
Leukocytes,Ua: NEGATIVE
Nitrite: NEGATIVE
Protein, ur: 30 mg/dL — AB
Specific Gravity, Urine: >1.046 — ABNORMAL HIGH (ref 1.005–1.030)
pH: 7 (ref 5.0–8.0)

## 2019-07-18 LAB — BASIC METABOLIC PANEL
Anion gap: 8 (ref 5–15)
BUN: 23 mg/dL — ABNORMAL HIGH (ref 6–20)
CO2: 25 mmol/L (ref 22–32)
Calcium: 8.3 mg/dL — ABNORMAL LOW (ref 8.9–10.3)
Chloride: 105 mmol/L (ref 98–111)
Creatinine, Ser: 1.15 mg/dL (ref 0.61–1.24)
GFR calc Af Amer: >60 mL/min (ref >60)
GFR calc non Af Amer: >60 mL/min (ref >60)
Glucose, Bld: 96 mg/dL (ref 70–99)
Potassium: 3.7 mmol/L (ref 3.5–5.1)
Sodium: 138 mmol/L (ref 135–145)

## 2019-07-18 LAB — HIV ANTIBODY (ROUTINE TESTING W REFLEX): HIV Screen 4th Generation wRfx: NONREACTIVE

## 2019-07-18 LAB — CBC
HCT: 33.6 % — ABNORMAL LOW (ref 39.0–52.0)
Hemoglobin: 9.7 g/dL — ABNORMAL LOW (ref 13.0–17.0)
MCH: 19.8 pg — ABNORMAL LOW (ref 26.0–34.0)
MCHC: 28.9 g/dL — ABNORMAL LOW (ref 30.0–36.0)
MCV: 68.4 fL — ABNORMAL LOW (ref 80.0–100.0)
Platelets: 341 10*3/uL (ref 150–400)
RBC: 4.91 MIL/uL (ref 4.22–5.81)
RDW: 20.8 % — ABNORMAL HIGH (ref 11.5–15.5)
WBC: 8.5 10*3/uL (ref 4.0–10.5)
nRBC: 0 % (ref 0.0–0.2)

## 2019-07-18 LAB — FERRITIN: Ferritin: 11 ng/mL — ABNORMAL LOW (ref 24–336)

## 2019-07-18 SURGERY — ESOPHAGOGASTRODUODENOSCOPY (EGD) WITH PROPOFOL
Anesthesia: Monitor Anesthesia Care

## 2019-07-18 MED ORDER — PROPOFOL 500 MG/50ML IV EMUL
INTRAVENOUS | Status: DC | PRN
  Administered 2019-07-18: 175 ug/kg/min via INTRAVENOUS

## 2019-07-18 MED ORDER — SODIUM CHLORIDE 0.9 % IV SOLN: INTRAVENOUS | Status: DC

## 2019-07-18 MED ORDER — FENTANYL CITRATE (PF) 100 MCG/2ML IJ SOLN
INTRAMUSCULAR | Status: DC | PRN
  Administered 2019-07-18: 50 ug via INTRAVENOUS

## 2019-07-18 MED ORDER — PROPOFOL 10 MG/ML IV BOLUS
INTRAVENOUS | Status: AC
  Filled 2019-07-18: qty 20

## 2019-07-18 MED ORDER — SODIUM CHLORIDE 0.9 % IV SOLN
510.0000 mg | Freq: Once | INTRAVENOUS | Status: AC
  Administered 2019-07-19: 10:00:00 510 mg via INTRAVENOUS
  Filled 2019-07-18: qty 17

## 2019-07-18 MED ORDER — PROPOFOL 10 MG/ML IV BOLUS
INTRAVENOUS | Status: DC | PRN
  Administered 2019-07-18: 20 mg via INTRAVENOUS
  Administered 2019-07-18: 50 mg via INTRAVENOUS

## 2019-07-18 MED ORDER — LACTATED RINGERS IV SOLN
INTRAVENOUS | Status: DC
  Administered 2019-07-18: 14:00:00 1000 mL via INTRAVENOUS

## 2019-07-18 MED ORDER — FENTANYL CITRATE (PF) 100 MCG/2ML IJ SOLN
INTRAMUSCULAR | Status: AC
  Filled 2019-07-18: qty 2

## 2019-07-18 SURGICAL SUPPLY — 15 items

## 2019-07-18 NOTE — Transfer of Care (Signed)
Immediate Anesthesia Transfer of Care Note  Patient: William Horne  Procedure(s) Performed: Procedure(s): ESOPHAGOGASTRODUODENOSCOPY (EGD) WITH PROPOFOL (N/A) BIOPSY  Patient Location: PACU  Anesthesia Type:MAC  Level of Consciousness:  sedated, patient cooperative and responds to stimulation  Airway & Oxygen Therapy:Patient Spontanous Breathing and Patient connected to face mask oxgen  Post-op Assessment:  Report given to PACU RN and Post -op Vital signs reviewed and stable  Post vital signs:  Reviewed and stable  Last Vitals:  Vitals:   07/18/19 1300 07/18/19 1335  BP: 127/84 133/89  Pulse: 63 61  Resp: 16 13  Temp:  36.8 C  SpO2: 543% 014%    Complications: No apparent anesthesia complications

## 2019-07-18 NOTE — Consult Note (Addendum)
Referring Provider:   Jennifer Choi, MD Primary Care Physician:  Patient, No Pcp Per Primary Gastroenterologist: None (unassigned)  Reason for Consultation: Nausea and vomiting, abnormal CT of stomach.  HPI: William Horne is a 43 y.o. 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  He has no prior GI history, apart from periodic episodes of nausea and vomiting perhaps once a year or so.    There is a history of previous significant ethanol consumption, heavy amounts, perhaps twice a week, but none for the past month and a half.  With that background, for about the past week, he has had nausea and vomiting, without abdominal pain, making it difficult for him to keep down fluids or food.  He was seen in the emergency room 2 nights ago and released with antinausea medicine, but because of ongoing symptoms, he returned to the emergency room last night where a CT scan was obtained raising the question of a lesser curve gastric ulcer, and he was admitted.  He is on a Protonix infusion and he indicates he is feeling much better this morning.  The patient does smoke 1 pack/day but denies any family history of ulcer disease and he denies exposure to ulcerogenic medications such as nonsteroidal anti-inflammatory drugs or aspirin products.  The patient was Hemoccult negative on admission although his current hemoglobin of 9.7 is down about 2 g from baseline, he is microcytic, and his ferritin is low at 11.  He saw dark stool several days ago but he indicates it was formed in character.  He had an ultrasound 2 days ago that was negative for gallstones or other abnormalities.  The patient indicates that his manager has told him that it looks like he is lost a lot of weight, although the patient himself is not really aware of that.     Past Medical History:  Diagnosis Date  . Dental caries   . Eczema   . Gastritis   . Hepatitis C   . Shoulder dislocation      History reviewed. No pertinent surgical history.  Prior to Admission medications   Medication Sig Start Date End Date Taking? Authorizing Provider  bismuth subsalicylate (PEPTO BISMOL) 262 MG/15ML suspension Take 30 mLs by mouth every 6 (six) hours as needed for indigestion or diarrhea or loose stools.   Yes [provider]  ondansetron (ZOFRAN ODT) 4 MG disintegrating tablet Take 1 tablet (4 mg total) by mouth every 8 (eight) hours as needed for nausea or vomiting. 07/16/19  Yes Hernandez, Ana P, PA-C  omeprazole (PRILOSEC) 20 MG capsule Take 1 capsule (20 mg total) by mouth daily. Patient not taking: Reported on 07/16/2019 01/31/16   Knott, Daniel, MD  penicillin v potassium (VEETID) 500 MG tablet Take 1 tablet (500 mg total) by mouth 3 (three) times daily. Patient not taking: Reported on 01/31/2016 01/20/16   Tran, Bowie, PA-C  promethazine (PHENERGAN) 25 MG suppository Place 1 suppository (25 mg total) rectally every 6 (six) hours as needed for nausea or vomiting. Patient not taking: Reported on 07/16/2019 01/21/16   Floyd, Dan, DO  ranitidine (ZANTAC) 150 MG tablet Take 1 tablet (150 mg total) by mouth daily. Patient not taking: Reported on 07/16/2019 01/20/16   Tran, Bowie, PA-C    Current Facility-Administered Medications  Medication Dose Route Frequency Provider Last Rate Last Dose  . 0.9 %  sodium chloride infusion   Intravenous Continuous Patel, Vishal R, MD 100 mL/hr at 07/18/19 0035    .   acetaminophen (TYLENOL) tablet 650 mg  650 mg Oral Q6H PRN Charlsie QuestPatel, Vishal R, MD       Or  . acetaminophen (TYLENOL) suppository 650 mg  650 mg Rectal Q6H PRN Darreld McleanPatel, Vishal R, MD      . nicotine (NICODERM CQ - dosed in mg/24 hours) patch 21 mg  21 mg Transdermal Daily Charlsie QuestPatel, Vishal R, MD   21 mg at 07/18/19 0921  . ondansetron (ZOFRAN) tablet 4 mg  4 mg Oral Q6H PRN Charlsie QuestPatel, Vishal R, MD       Or  . ondansetron (ZOFRAN) injection 4 mg  4 mg Intravenous Q6H PRN Darreld McleanPatel, Vishal R, MD      .  pantoprazole (PROTONIX) 80 mg in sodium chloride 0.9 % 250 mL (0.32 mg/mL) infusion  8 mg/hr Intravenous Continuous Liberty HandyGibbons, Claudia J, PA-C 25 mL/hr at 07/18/19 0141 8 mg/hr at 07/18/19 0141   Current Outpatient Medications  Medication Sig Dispense Refill  . bismuth subsalicylate (PEPTO BISMOL) 262 MG/15ML suspension Take 30 mLs by mouth every 6 (six) hours as needed for indigestion or diarrhea or loose stools.    . ondansetron (ZOFRAN ODT) 4 MG disintegrating tablet Take 1 tablet (4 mg total) by mouth every 8 (eight) hours as needed for nausea or vomiting. 20 tablet 0  . omeprazole (PRILOSEC) 20 MG capsule Take 1 capsule (20 mg total) by mouth daily. (Patient not taking: Reported on 07/16/2019) 30 capsule 0  . penicillin v potassium (VEETID) 500 MG tablet Take 1 tablet (500 mg total) by mouth 3 (three) times daily. (Patient not taking: Reported on 01/31/2016) 30 tablet 0  . promethazine (PHENERGAN) 25 MG suppository Place 1 suppository (25 mg total) rectally every 6 (six) hours as needed for nausea or vomiting. (Patient not taking: Reported on 07/16/2019) 6 each 0  . ranitidine (ZANTAC) 150 MG tablet Take 1 tablet (150 mg total) by mouth daily. (Patient not taking: Reported on 07/16/2019) 30 tablet 0    Allergies as of 07/17/2019 - Review Complete 07/17/2019  Allergen Reaction Noted  . Asa [aspirin] Nausea And Vomiting 01/20/2016    Family History  Problem Relation Age of Onset  . Rheum arthritis Mother   . Osteoarthritis Mother   . Hypertension Father   . Migraines Sister     Social History   Socioeconomic History  . Marital status: Single    Spouse name: Not on file  . Number of children: Not on file  . Years of education: Not on file  . Highest education level: Not on file  Occupational History  . Not on file  Social Needs  . Financial resource strain: Not on file  . Food insecurity    Worry: Not on file    Inability: Not on file  . Transportation needs    Medical: Not on  file    Non-medical: Not on file  Tobacco Use  . Smoking status: Current Every Day Smoker    Packs/day: 3.00    Years: 15.00    Pack years: 45.00    Types: Cigarettes  . Smokeless tobacco: Former NeurosurgeonUser    Quit date: 11/22/2009  Substance and Sexual Activity  . Alcohol use: Yes    Comment: a beer a month ago  . Drug use: Yes    Types: Marijuana  . Sexual activity: Yes    Birth control/protection: None  Lifestyle  . Physical activity    Days per week: Not on file    Minutes per session: Not on file  .  Stress: Not on file  Relationships  . Social Herbalist on phone: Not on file    Gets together: Not on file    Attends religious service: Not on file    Active member of club or organization: Not on file    Attends meetings of clubs or organizations: Not on file    Relationship status: Not on file  . Intimate partner violence    Fear of current or ex partner: Not on file    Emotionally abused: Not on file    Physically abused: Not on file    Forced sexual activity: Not on file  Other Topics Concern  . Not on file  Social History Narrative  . Not on file    Review of Systems: Negative for chest pain or trouble breathing, urinary symptoms or leg swelling, skin rashes or joint effusions or focal neurologic problems  Physical Exam: Vital signs in last 24 hours: Temp:  [98.8 F (37.1 C)-99 F (37.2 C)] 98.8 F (37.1 C) (07/28 1911) Pulse Rate:  [60-93] 83 (07/29 0900) Resp:  [11-22] 22 (07/29 0900) BP: (105-127)/(67-87) 105/72 (07/29 0900) SpO2:  [98 %-100 %] 100 % (07/29 0900) Weight:  [88.5 kg] 88.5 kg (07/28 1447)   General:   Alert,  Well-developed, well-nourished, pleasant and cooperative in NAD Head:  Normocephalic and atraumatic. Eyes:  Sclera clear, no icterus.   Conjunctiva pink. Neck:   No masses or thyromegaly. Lungs:  Clear throughout to auscultation.   No wheezes, crackles, or rhonchi. No evident respiratory distress. Heart:   Regular rate and  rhythm; no murmurs, clicks, rubs,  or gallops. Abdomen:  Soft, nontender, nontympanitic, and nondistended. No masses, hepatosplenomegaly or ventral hernias noted.    Rectal: Not performed, but stool Hemoccult negative through emergency room Msk:   Symmetrical without gross deformities. Pulses:  Normal radial pulse is noted. Extremities:   Without edema. Neurologic:  Alert and coherent;  grossly normal neurologically. Skin:  Intact without significant lesions or rashes. Cervical Nodes:  No significant cervical adenopathy. Psych:   Alert and cooperative. Normal mood and affect.  Intake/Output from previous day: 07/28 0701 - 07/29 0700 In: 1100 [IV Piggyback:1100] Out: -  Intake/Output this shift: Total I/O In: -  Out: 575 [Urine:575]  Lab Results: Recent Labs    07/16/19 1215 07/17/19 1752 07/18/19 0528  WBC 9.1 10.8* 8.5  HGB 12.6* 11.9* 9.7*  HCT 43.4 41.6 33.6*  PLT 420* 406* 341   BMET Recent Labs    07/16/19 1215 07/17/19 1752 07/18/19 0528  NA 137 137 138  K 3.7 3.7 3.7  CL 99 99 105  CO2 25 27 25   GLUCOSE 108* 115* 96  BUN 30* 26* 23*  CREATININE 1.33* 1.26* 1.15  CALCIUM 9.5 9.3 8.3*   LFT Recent Labs    07/17/19 1752  PROT 8.2*  ALBUMIN 4.3  AST 47*  ALT 26  ALKPHOS 46  BILITOT 0.9   PT/INR No results for input(s): LABPROT, INR in the last 72 hours.  Studies/Results: Ct Abdomen Pelvis W Contrast  Result Date: 07/17/2019 CLINICAL DATA:  Nausea/vomiting, epigastric abdominal pain EXAM: CT ABDOMEN AND PELVIS WITH CONTRAST TECHNIQUE: Multidetector CT imaging of the abdomen and pelvis was performed using the standard protocol following bolus administration of intravenous contrast. CONTRAST:  146mL OMNIPAQUE IOHEXOL 300 MG/ML  SOLN COMPARISON:  None. FINDINGS: Lower chest: 11 x 7 x 14 mm irregular nodule in the left lower lobe (series 4/image 13), indeterminate. Hepatobiliary: Liver  is within normal limits. Gallbladder is unremarkable. No intrahepatic  or extrahepatic ductal dilatation. Pancreas: Within normal limits. Spleen: Within normal limits. Adrenals/Urinary Tract: Adrenal glands are within normal limits. Kidneys are within normal limits.  No hydronephrosis. Bladder is within normal limits. Stomach/Bowel: Prominent rugal folds, nonspecific. Mild gastric wall edema along the lesser curvature with a suspected gastric ulcer (series 2/image 26). No associated free air to suggest perforation. No evidence of bowel obstruction. Normal appendix (series 2/image 55). Vascular/Lymphatic: No evidence of abdominal aortic aneurysm. Mild atherosclerotic calcifications the abdominal aorta. No suspicious abdominopelvic lymphadenopathy. Reproductive: Prostate is unremarkable. Other: No abdominopelvic ascites. Musculoskeletal: Degenerative changes at L5-S1. IMPRESSION: Suspected gastric ulcer with surrounding gastric wall edema along the lesser curvature of the stomach. No associated free air to suggest perforation. Consider endoscopy for further evaluation. 14 mm irregular left lower lobe nodule, indeterminate. No prior imaging is available for comparison, and therefore infection/inflammation remains within the differential. Consider short-term follow-up CT chest in 4-6 weeks to assess for persistence. If present at that time, this would be concerning for malignancy, and PET-CT would be the appropriate next step. These results were called by telephone at the time of interpretation on 07/17/2019 at 10:30 pm to [facility]IBBONS, PA, who verbally acknowledged these results. Electronically Signed   By: Charline BillsSriyesh  Krishnan M.D.   On: 07/17/2019 22:32   [place] Abdomen Limited Ruq  Result Date: 07/16/2019 CLINICAL DATA:  Abdominal pain. EXAM: ULTRASOUND ABDOMEN LIMITED RIGHT UPPER QUADRANT COMPARISON:  Abdominal series 11/20/2011. FINDINGS: Gallbladder: No gallstones or wall thickening visualized. No sonographic Murphy sign noted by sonographer. Common bile duct: Diameter: 4.4 Liver: No  focal lesion identified. Within normal limits in parenchymal echogenicity. Portal vein is patent on color Doppler imaging with normal direction of blood flow towards the liver. IMPRESSION: Negative exam.  No gallstones or biliary distention. Electronically Signed   By: Maisie Fushomas  Register   On: 07/16/2019 12:40    Impression: 1.  Nausea and vomiting, subacute, persistent, currently resolved 2.  CT evidence of lesser curve gastric ulcer 3.  Questionable history of weight loss 4.  Previous history of heavy ethanol consumption by patient's report 5.  Hepatitis C history on record, patient not questioned about this today (no evidence of cirrhotic changes or thrombocytopenia on imaging or blood work) 6.  Iron deficiency anemia with currently Hemoccult negative stool  Plan: 1.  Agree with Protonix infusion 2.  Proceed to endoscopic evaluation this afternoon.  Nature, purpose, option of observation, and risks (anesthesia problems, perforation, bleeding) discussed with patient and he is agreeable.  Further management to depend on the endoscopic findings. 3.  If endoscopy is unrevealing, consider colonoscopy for further evaluation of anemia 4.  Consider further questioning and/or testing concerning possible hepatitis C   LOS: 1 day   Katy FitchRobert V Jacobus Colvin  07/18/2019, 9:54 AM   Pager [phone] If no answer or after 5 PM call [phone]

## 2019-07-18 NOTE — Anesthesia Preprocedure Evaluation (Addendum)
Anesthesia Evaluation  Patient identified by MRN, date of birth, ID band Patient awake    Reviewed: Allergy & Precautions, NPO status , Patient's Chart, lab work & pertinent test results  Airway Mallampati: I  TM Distance: >3 FB Neck ROM: Full    Dental no notable dental hx. (+) Dental Advisory Given, Teeth Intact   Pulmonary neg pulmonary ROS, Current Smoker,    Pulmonary exam normal breath sounds clear to auscultation       Cardiovascular negative cardio ROS Normal cardiovascular exam Rhythm:Regular Rate:Normal     Neuro/Psych negative neurological ROS  negative psych ROS   GI/Hepatic PUD, GERD  Medicated,(+)     substance abuse  marijuana use, Hepatitis -, C  Endo/Other  negative endocrine ROS  Renal/GU negative Renal ROS  negative genitourinary   Musculoskeletal negative musculoskeletal ROS (+)   Abdominal   Peds  Hematology  (+) Blood dyscrasia, anemia ,   Anesthesia Other Findings EGD for N/V thought to be 2/2 cannabinoid hyperemesis syndrome, not actively vomiting  Reproductive/Obstetrics                            Anesthesia Physical Anesthesia Plan  ASA: III  Anesthesia Plan: MAC   Post-op Pain Management:    Induction: Intravenous  PONV Risk Score and Plan: 0 and Treatment may vary due to age or medical condition and Propofol infusion  Airway Management Planned: Natural Airway and Simple Face Mask  Additional Equipment:   Intra-op Plan:   Post-operative Plan:   Informed Consent: I have reviewed the patients History and Physical, chart, labs and discussed the procedure including the risks, benefits and alternatives for the proposed anesthesia with the patient or authorized representative who has indicated his/her understanding and acceptance.     Dental advisory given  Plan Discussed with: CRNA  Anesthesia Plan Comments:         Anesthesia Quick  Evaluation

## 2019-07-18 NOTE — ED Notes (Signed)
Report given to [place] for [place], [place].

## 2019-07-18 NOTE — H&P (View-Only) (Signed)
Referring Provider:   Noralee StainJennifer Choi, MD Primary Care Physician:  Patient, No Pcp Per Primary Gastroenterologist: None (unassigned)  Reason for Consultation: Nausea and vomiting, abnormal CT of stomach.  HPI: William Horne is a 43 y.o. 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  He has no prior GI history, apart from periodic episodes of nausea and vomiting perhaps once a year or so.    There is a history of previous significant ethanol consumption, heavy amounts, perhaps twice a week, but none for the past month and a half.  With that background, for about the past week, he has had nausea and vomiting, without abdominal pain, making it difficult for him to keep down fluids or food.  He was seen in the emergency room 2 nights ago and released with antinausea medicine, but because of ongoing symptoms, he returned to the emergency room last night where a CT scan was obtained raising the question of a lesser curve gastric ulcer, and he was admitted.  He is on a Protonix infusion and he indicates he is feeling much better this morning.  The patient does smoke 1 pack/day but denies any family history of ulcer disease and he denies exposure to ulcerogenic medications such as nonsteroidal anti-inflammatory drugs or aspirin products.  The patient was Hemoccult negative on admission although his current hemoglobin of 9.7 is down about 2 g from baseline, he is microcytic, and his ferritin is low at 11.  He saw dark stool several days ago but he indicates it was formed in Editor, commissioningcharacter.  He had an ultrasound 2 days ago that was negative for gallstones or other abnormalities.  The patient indicates that his manager has told him that it looks like he is lost a lot of weight, although the patient himself is not really aware of that.     Past Medical History:  Diagnosis Date  . Dental caries   . Eczema   . Gastritis   . Hepatitis C   . Shoulder dislocation      History reviewed. No pertinent surgical history.  Prior to Admission medications   Medication Sig Start Date End Date Taking? Authorizing Provider  bismuth subsalicylate (PEPTO BISMOL) 262 MG/15ML suspension Take 30 mLs by mouth every 6 (six) hours as needed for indigestion or diarrhea or loose stools.   Yes [provider]  ondansetron (ZOFRAN ODT) 4 MG disintegrating tablet Take 1 tablet (4 mg total) by mouth every 8 (eight) hours as needed for nausea or vomiting. 07/16/19  Yes Ardyth HarpsHernandez, Ana P, PA-C  omeprazole (PRILOSEC) 20 MG capsule Take 1 capsule (20 mg total) by mouth daily. Patient not taking: Reported on 07/16/2019 01/31/16   Lyndal PulleyKnott, Daniel, MD  penicillin v potassium (VEETID) 500 MG tablet Take 1 tablet (500 mg total) by mouth 3 (three) times daily. Patient not taking: Reported on 01/31/2016 01/20/16   Fayrene Helperran, Bowie, PA-C  promethazine (PHENERGAN) 25 MG suppository Place 1 suppository (25 mg total) rectally every 6 (six) hours as needed for nausea or vomiting. Patient not taking: Reported on 07/16/2019 01/21/16   Melene PlanFloyd, Dan, DO  ranitidine (ZANTAC) 150 MG tablet Take 1 tablet (150 mg total) by mouth daily. Patient not taking: Reported on 07/16/2019 01/20/16   Fayrene Helperran, Bowie, PA-C    Current Facility-Administered Medications  Medication Dose Route Frequency Provider Last Rate Last Dose  . 0.9 %  sodium chloride infusion   Intravenous Continuous Charlsie QuestPatel, Vishal R, MD 100 mL/hr at 07/18/19 0035    .  acetaminophen (TYLENOL) tablet 650 mg  650 mg Oral Q6H PRN Charlsie QuestPatel, Vishal R, MD       Or  . acetaminophen (TYLENOL) suppository 650 mg  650 mg Rectal Q6H PRN Darreld McleanPatel, Vishal R, MD      . nicotine (NICODERM CQ - dosed in mg/24 hours) patch 21 mg  21 mg Transdermal Daily Charlsie QuestPatel, Vishal R, MD   21 mg at 07/18/19 0921  . ondansetron (ZOFRAN) tablet 4 mg  4 mg Oral Q6H PRN Charlsie QuestPatel, Vishal R, MD       Or  . ondansetron (ZOFRAN) injection 4 mg  4 mg Intravenous Q6H PRN Darreld McleanPatel, Vishal R, MD      .  pantoprazole (PROTONIX) 80 mg in sodium chloride 0.9 % 250 mL (0.32 mg/mL) infusion  8 mg/hr Intravenous Continuous Liberty HandyGibbons, Claudia J, PA-C 25 mL/hr at 07/18/19 0141 8 mg/hr at 07/18/19 0141   Current Outpatient Medications  Medication Sig Dispense Refill  . bismuth subsalicylate (PEPTO BISMOL) 262 MG/15ML suspension Take 30 mLs by mouth every 6 (six) hours as needed for indigestion or diarrhea or loose stools.    . ondansetron (ZOFRAN ODT) 4 MG disintegrating tablet Take 1 tablet (4 mg total) by mouth every 8 (eight) hours as needed for nausea or vomiting. 20 tablet 0  . omeprazole (PRILOSEC) 20 MG capsule Take 1 capsule (20 mg total) by mouth daily. (Patient not taking: Reported on 07/16/2019) 30 capsule 0  . penicillin v potassium (VEETID) 500 MG tablet Take 1 tablet (500 mg total) by mouth 3 (three) times daily. (Patient not taking: Reported on 01/31/2016) 30 tablet 0  . promethazine (PHENERGAN) 25 MG suppository Place 1 suppository (25 mg total) rectally every 6 (six) hours as needed for nausea or vomiting. (Patient not taking: Reported on 07/16/2019) 6 each 0  . ranitidine (ZANTAC) 150 MG tablet Take 1 tablet (150 mg total) by mouth daily. (Patient not taking: Reported on 07/16/2019) 30 tablet 0    Allergies as of 07/17/2019 - Review Complete 07/17/2019  Allergen Reaction Noted  . Asa [aspirin] Nausea And Vomiting 01/20/2016    Family History  Problem Relation Age of Onset  . Rheum arthritis Mother   . Osteoarthritis Mother   . Hypertension Father   . Migraines Sister     Social History   Socioeconomic History  . Marital status: Single    Spouse name: Not on file  . Number of children: Not on file  . Years of education: Not on file  . Highest education level: Not on file  Occupational History  . Not on file  Social Needs  . Financial resource strain: Not on file  . Food insecurity    Worry: Not on file    Inability: Not on file  . Transportation needs    Medical: Not on  file    Non-medical: Not on file  Tobacco Use  . Smoking status: Current Every Day Smoker    Packs/day: 3.00    Years: 15.00    Pack years: 45.00    Types: Cigarettes  . Smokeless tobacco: Former NeurosurgeonUser    Quit date: 11/22/2009  Substance and Sexual Activity  . Alcohol use: Yes    Comment: a beer a month ago  . Drug use: Yes    Types: Marijuana  . Sexual activity: Yes    Birth control/protection: None  Lifestyle  . Physical activity    Days per week: Not on file    Minutes per session: Not on file  .  Stress: Not on file  Relationships  . Social Herbalist on phone: Not on file    Gets together: Not on file    Attends religious service: Not on file    Active member of club or organization: Not on file    Attends meetings of clubs or organizations: Not on file    Relationship status: Not on file  . Intimate partner violence    Fear of current or ex partner: Not on file    Emotionally abused: Not on file    Physically abused: Not on file    Forced sexual activity: Not on file  Other Topics Concern  . Not on file  Social History Narrative  . Not on file    Review of Systems: Negative for chest pain or trouble breathing, urinary symptoms or leg swelling, skin rashes or joint effusions or focal neurologic problems  Physical Exam: Vital signs in last 24 hours: Temp:  [98.8 F (37.1 C)-99 F (37.2 C)] 98.8 F (37.1 C) (07/28 1911) Pulse Rate:  [60-93] 83 (07/29 0900) Resp:  [11-22] 22 (07/29 0900) BP: (105-127)/(67-87) 105/72 (07/29 0900) SpO2:  [98 %-100 %] 100 % (07/29 0900) Weight:  [88.5 kg] 88.5 kg (07/28 1447)   General:   Alert,  Well-developed, well-nourished, pleasant and cooperative in NAD Head:  Normocephalic and atraumatic. Eyes:  Sclera clear, no icterus.   Conjunctiva pink. Neck:   No masses or thyromegaly. Lungs:  Clear throughout to auscultation.   No wheezes, crackles, or rhonchi. No evident respiratory distress. Heart:   Regular rate and  rhythm; no murmurs, clicks, rubs,  or gallops. Abdomen:  Soft, nontender, nontympanitic, and nondistended. No masses, hepatosplenomegaly or ventral hernias noted.    Rectal: Not performed, but stool Hemoccult negative through emergency room Msk:   Symmetrical without gross deformities. Pulses:  Normal radial pulse is noted. Extremities:   Without edema. Neurologic:  Alert and coherent;  grossly normal neurologically. Skin:  Intact without significant lesions or rashes. Cervical Nodes:  No significant cervical adenopathy. Psych:   Alert and cooperative. Normal mood and affect.  Intake/Output from previous day: 07/28 0701 - 07/29 0700 In: 1100 [IV Piggyback:1100] Out: -  Intake/Output this shift: Total I/O In: -  Out: 575 [Urine:575]  Lab Results: Recent Labs    07/16/19 1215 07/17/19 1752 07/18/19 0528  WBC 9.1 10.8* 8.5  HGB 12.6* 11.9* 9.7*  HCT 43.4 41.6 33.6*  PLT 420* 406* 341   BMET Recent Labs    07/16/19 1215 07/17/19 1752 07/18/19 0528  NA 137 137 138  K 3.7 3.7 3.7  CL 99 99 105  CO2 25 27 25   GLUCOSE 108* 115* 96  BUN 30* 26* 23*  CREATININE 1.33* 1.26* 1.15  CALCIUM 9.5 9.3 8.3*   LFT Recent Labs    07/17/19 1752  PROT 8.2*  ALBUMIN 4.3  AST 47*  ALT 26  ALKPHOS 46  BILITOT 0.9   PT/INR No results for input(s): LABPROT, INR in the last 72 hours.  Studies/Results: Ct Abdomen Pelvis W Contrast  Result Date: 07/17/2019 CLINICAL DATA:  Nausea/vomiting, epigastric abdominal pain EXAM: CT ABDOMEN AND PELVIS WITH CONTRAST TECHNIQUE: Multidetector CT imaging of the abdomen and pelvis was performed using the standard protocol following bolus administration of intravenous contrast. CONTRAST:  146mL OMNIPAQUE IOHEXOL 300 MG/ML  SOLN COMPARISON:  None. FINDINGS: Lower chest: 11 x 7 x 14 mm irregular nodule in the left lower lobe (series 4/image 13), indeterminate. Hepatobiliary: Liver  is within normal limits. Gallbladder is unremarkable. No intrahepatic  or extrahepatic ductal dilatation. Pancreas: Within normal limits. Spleen: Within normal limits. Adrenals/Urinary Tract: Adrenal glands are within normal limits. Kidneys are within normal limits.  No hydronephrosis. Bladder is within normal limits. Stomach/Bowel: Prominent rugal folds, nonspecific. Mild gastric wall edema along the lesser curvature with a suspected gastric ulcer (series 2/image 26). No associated free air to suggest perforation. No evidence of bowel obstruction. Normal appendix (series 2/image 55). Vascular/Lymphatic: No evidence of abdominal aortic aneurysm. Mild atherosclerotic calcifications the abdominal aorta. No suspicious abdominopelvic lymphadenopathy. Reproductive: Prostate is unremarkable. Other: No abdominopelvic ascites. Musculoskeletal: Degenerative changes at L5-S1. IMPRESSION: Suspected gastric ulcer with surrounding gastric wall edema along the lesser curvature of the stomach. No associated free air to suggest perforation. Consider endoscopy for further evaluation. 14 mm irregular left lower lobe nodule, indeterminate. No prior imaging is available for comparison, and therefore infection/inflammation remains within the differential. Consider short-term follow-up CT chest in 4-6 weeks to assess for persistence. If present at that time, this would be concerning for malignancy, and PET-CT would be the appropriate next step. These results were called by telephone at the time of interpretation on 07/17/2019 at 10:30 pm to [facility]IBBONS, PA, who verbally acknowledged these results. Electronically Signed   By: Charline BillsSriyesh  Krishnan M.D.   On: 07/17/2019 22:32   [place] Abdomen Limited Ruq  Result Date: 07/16/2019 CLINICAL DATA:  Abdominal pain. EXAM: ULTRASOUND ABDOMEN LIMITED RIGHT UPPER QUADRANT COMPARISON:  Abdominal series 11/20/2011. FINDINGS: Gallbladder: No gallstones or wall thickening visualized. No sonographic Murphy sign noted by sonographer. Common bile duct: Diameter: 4.4 Liver: No  focal lesion identified. Within normal limits in parenchymal echogenicity. Portal vein is patent on color Doppler imaging with normal direction of blood flow towards the liver. IMPRESSION: Negative exam.  No gallstones or biliary distention. Electronically Signed   By: Maisie Fushomas  Register   On: 07/16/2019 12:40    Impression: 1.  Nausea and vomiting, subacute, persistent, currently resolved 2.  CT evidence of lesser curve gastric ulcer 3.  Questionable history of weight loss 4.  Previous history of heavy ethanol consumption by patient's report 5.  Hepatitis C history on record, patient not questioned about this today (no evidence of cirrhotic changes or thrombocytopenia on imaging or blood work) 6.  Iron deficiency anemia with currently Hemoccult negative stool  Plan: 1.  Agree with Protonix infusion 2.  Proceed to endoscopic evaluation this afternoon.  Nature, purpose, option of observation, and risks (anesthesia problems, perforation, bleeding) discussed with patient and he is agreeable.  Further management to depend on the endoscopic findings. 3.  If endoscopy is unrevealing, consider colonoscopy for further evaluation of anemia 4.  Consider further questioning and/or testing concerning possible hepatitis C   LOS: 1 day   William Horne  07/18/2019, 9:54 AM   Pager [phone] If no answer or after 5 PM call [phone]

## 2019-07-18 NOTE — ED Notes (Signed)
Endoscopy will be taking patient to procedure and then taking patient from Endo to patient assigned bed upstairs.

## 2019-07-18 NOTE — Interval H&P Note (Signed)
History and Physical Interval Note:  07/18/2019 2:00 PM  William Horne  has presented today for surgery, with the diagnosis of nausea and vomiting.  The various methods of treatment have been discussed with the patient and family. After consideration of risks, benefits and other options for treatment, the patient has consented to  Procedure(s): ESOPHAGOGASTRODUODENOSCOPY (EGD) WITH PROPOFOL (N/A) as a surgical intervention.  The patient's history has been reviewed, patient examined, no change in status, stable for surgery.  I have reviewed the patient's chart and labs.  Questions were answered to the patient's satisfaction.     [name]

## 2019-07-18 NOTE — Op Note (Signed)
[facility] Patient Name: William BeaversRobert Solly Procedure Date: 07/18/2019 MRN: [id number] Attending MD: Shirley FriarVincent C Mohanad Carsten , MD Date of Birth: 06/30/1976 CSN: [id number] Age: 43 Admit Type: Inpatient Procedure:                Upper GI endoscopy Indications:              Epigastric abdominal pain, Abnormal CT of the GI                            tract Providers:                Shirley FriarVincent C. Viviene Thurston, MD, Dwain SarnaPatricia Ford, RN, Roslynn Ambleori                            Philipps, RN, Arlee Muslimhris Chandler Tech., Technician,                            Greig RightLogan Key, CRNA Referring MD:             hospital team Medicines:                Propofol per Anesthesia, Monitored Anesthesia Care Complications:            No immediate complications. Estimated Blood Loss:     Estimated blood loss was minimal. Procedure:                Pre-Anesthesia Assessment:                           - Prior to the procedure, a History and Physical                            was performed, and patient medications and                            allergies were reviewed. The patient's tolerance of                            previous anesthesia was also reviewed. The risks                            and benefits of the procedure and the sedation                            options and risks were discussed with the patient.                            All questions were answered, and informed consent                            was obtained. Prior Anticoagulants: The patient has                            taken no previous anticoagulant or antiplatelet  agents. ASA Grade Assessment: II - A patient with                            mild systemic disease. After reviewing the risks                            and benefits, the patient was deemed in                            satisfactory condition to undergo the procedure.                           After obtaining informed consent, the endoscope was         passed under direct vision. Throughout the                            procedure, the patient's blood pressure, pulse, and                            oxygen saturations were monitored continuously. The                            GIF-[id number] ([id number] Olympus gastroscope was                            introduced through the mouth, and advanced to the                            second part of duodenum. The upper GI endoscopy was                            accomplished without difficulty. The patient                            tolerated the procedure well. Scope In: Scope Out: Findings:      LA Grade C (one or more mucosal breaks continuous between tops of 2 or       more mucosal folds, less than 75% circumference) esophagitis with       bleeding was found at the gastroesophageal junction.      The Z-line was found 42 cm from the incisors.      One non-bleeding cratered gastric ulcer with no stigmata of bleeding was       found on the lesser curvature of the stomach. The lesion was 10 mm in       largest dimension.      Segmental severe inflammation characterized by congestion (edema) and       erythema was found on the lesser curvature of the stomach. Biopsies were       taken with a cold forceps for histology. Estimated blood loss was       minimal.      Three non-bleeding linear gastric ulcers with no stigmata of bleeding       were found in the gastric antrum. The largest lesion was 15 mm in       largest dimension.  The cardia and gastric fundus were normal on retroflexion.      The examined duodenum was normal.      Segmental mild inflammation characterized by congestion (edema) and       erythema was found in the gastric antrum. Biopsies were taken with a       cold forceps for histology. Estimated blood loss was minimal. Impression:               - LA Grade C reflux esophagitis.                           - Z-line, 42 cm from the incisors.                           -  Non-bleeding gastric ulcer with no stigmata of                            bleeding.                           - Acute gastritis. Biopsied.                           - Non-bleeding gastric ulcers with no stigmata of                            bleeding.                           - Normal examined duodenum.                           - Acute gastritis. Biopsied. Moderate Sedation:      Not Applicable - Patient had care per Anesthesia. Recommendation:           - Await pathology results.                           - Observe patient's clinical course.                           - Clear liquid diet. Procedure Code(s):        --- Professional ---                           [phone], Esophagogastroduodenoscopy, flexible,                            transoral; with biopsy, single or multiple Diagnosis Code(s):        --- Professional ---                           K25.9, Gastric ulcer, unspecified as acute or                            chronic, without hemorrhage or perforation  K29.00, Acute gastritis without bleeding                           K21.0, Gastro-esophageal reflux disease with                            esophagitis                           R10.13, Epigastric pain                           R93.3, Abnormal findings on diagnostic imaging of                            other parts of digestive tract CPT copyright 2019 [employer]. All rights reserved. The codes documented in this report are preliminary and upon coder review may  be revised to meet current compliance requirements. Shirley FriarVincent C Barnabas Henriques, MD 07/18/2019 2:31:54 PM This report has been signed electronically. Number of Addenda: 0

## 2019-07-18 NOTE — Anesthesia Postprocedure Evaluation (Signed)
Anesthesia Post Note  Patient: William Horne  Procedure(s) Performed: ESOPHAGOGASTRODUODENOSCOPY (EGD) WITH PROPOFOL (N/A ) BIOPSY     Anesthesia Post Evaluation  Last Vitals:  Vitals:   07/18/19 1450 07/18/19 1500  BP:  136/82  Pulse: (!) 55 62  Resp: 17 16  Temp:    SpO2: 100% 100%    Last Pain:  Vitals:   07/18/19 1500  TempSrc:   PainSc: 0-No pain                 Edie Darley

## 2019-07-18 NOTE — ED Notes (Signed)
Pt moved into an impatient bed for comfort due to holding in ED for admission.

## 2019-07-18 NOTE — ED Notes (Signed)
Patient denies nausea at this time. Provided with ice chips. Will continue to monitor patient.

## 2019-07-18 NOTE — Progress Notes (Signed)
PROGRESS NOTE    William Horne  OAC:[id number] DOB: 1976/01/04 DOA: 07/17/2019 PCP: Patient, No Pcp Per     Brief Narrative:  William Horne is a 43 y.o. male with medical history significant for gastritis, reported history of hepatitis C, tobacco use, and marijuana use with suspected history of cannabinoid hyperemesis syndrome who presents to the ED for evaluation of persistent nausea and vomiting.  Patient states symptoms began 4 days ago and he has been unable to maintain any adequate oral intake.  He reports mostly clear emesis but has seen occasional streaks of blood or brown-colored emesis.  He denies any coffee-ground appearance.  He has intermittent abdominal pain associated with his vomiting episodes.  He reports seeing dark black-colored stool without bright red blood.  He reports occasional chills.  He denies any subjective fevers, chest pain, dyspnea, or dysuria.  Salted and patient admitted for concern for gastric ulcer and gastritis.  New events last 24 hours / Subjective: No further bowel movement since admission, one episode of vomiting early this morning around 2-3 AM.  Assessment & Plan:   Principal Problem:   Gastric ulcer, unspecified as acute or chronic, without hemorrhage or perforation Active Problems:   Tobacco use   Marijuana use   Nausea and vomiting   Microcytic anemia   Nausea, vomiting with melanotic stool -Concern for gastric ulcer, gastritis -CT abdomen pelvis revealed suspected gastric ulcer with surrounding gastric wall edema along the lesser curvature of the stomach, without free air to suggest perforation -FOBT negative -Protonix drip -Trend hemoglobin, stable currently 9.7 -GI consulted, planning for EGD this afternoon  Iron deficiency anemia -Ferritin 11 -IV Feraheme ordered  Incidental finding of left lower lobe nodule -Need follow-up with outpatient CT chest in 4 to 6 weeks  Tobacco abuse -Nicotine patch, cessation  counseling        DVT prophylaxis: SCD Code Status: Full code Family Communication: None Disposition Plan: Pending further work-up and stabilization   Consultants:   GI  Procedures:   None  Antimicrobials:  Anti-infectives (From admission, onward)   None       Objective: Vitals:   07/18/19 0844 07/18/19 0900 07/18/19 1000 07/18/19 1100  BP: 123/83 105/72 126/82 112/78  Pulse: 74 83 (!) 58 62  Resp: 13 (!) 22 12 14   Temp:      TempSrc:      SpO2: 100% 100% 100% 100%  Weight:      Height:        Intake/Output Summary (Last 24 hours) at 07/18/2019 1211 Last data filed at 07/18/2019 0706 Gross per 24 hour  Intake 1100 ml  Output 575 ml  Net 525 ml   Filed Weights   07/17/19 1447  Weight: 88.5 kg    Examination:  General exam: Appears calm and comfortable  Respiratory system: Clear to auscultation. Respiratory effort normal. Cardiovascular system: S1 & S2 heard, RRR. No JVD, murmurs, rubs, gallops or clicks. No pedal edema. Gastrointestinal system: Abdomen is nondistended, soft and nontender. No organomegaly or masses felt. Normal bowel sounds heard. Central nervous system: Alert and oriented. No focal neurological deficits. Extremities: Symmetric 5 x 5 power. Skin: No rashes, lesions or ulcers Psychiatry: Judgement and insight appear normal. Mood & affect appropriate.   Data Reviewed: I have personally reviewed following labs and imaging studies  CBC: Recent Labs  Lab 07/16/19 1215 07/17/19 1752 07/18/19 0528  WBC 9.1 10.8* 8.5  HGB 12.6* 11.9* 9.7*  HCT 43.4 41.6 33.6*  MCV 67.9*  69.1* 68.4*  PLT 420* 406* 341   Basic Metabolic Panel: Recent Labs  Lab 07/16/19 1215 07/17/19 1752 07/18/19 0528  NA 137 137 138  K 3.7 3.7 3.7  CL 99 99 105  CO2 25 27 25   GLUCOSE 108* 115* 96  BUN 30* 26* 23*  CREATININE 1.33* 1.26* 1.15  CALCIUM 9.5 9.3 8.3*   GFR: Estimated Creatinine Clearance: 102.7 mL/min (by C-G formula based on SCr of 1.15  mg/dL). Liver Function Tests: Recent Labs  Lab 07/16/19 1215 07/17/19 1752  AST 36 47*  ALT 21 26  ALKPHOS 48 46  BILITOT 1.1 0.9  PROT 8.9* 8.2*  ALBUMIN 4.5 4.3   Recent Labs  Lab 07/16/19 1215 07/17/19 1752  LIPASE 24 28   No results for input(s): AMMONIA in the last 168 hours. Coagulation Profile: No results for input(s): INR, PROTIME in the last 168 hours. Cardiac Enzymes: No results for input(s): CKTOTAL, CKMB, CKMBINDEX, TROPONINI in the last 168 hours. BNP (last 3 results) No results for input(s): PROBNP in the last 8760 hours. HbA1C: No results for input(s): HGBA1C in the last 72 hours. CBG: No results for input(s): GLUCAP in the last 168 hours. Lipid Profile: No results for input(s): CHOL, HDL, LDLCALC, TRIG, CHOLHDL, LDLDIRECT in the last 72 hours. Thyroid Function Tests: No results for input(s): TSH, T4TOTAL, FREET4, T3FREE, THYROIDAB in the last 72 hours. Anemia Panel: Recent Labs    07/18/19 0000  FERRITIN 11*   Sepsis Labs: No results for input(s): PROCALCITON, LATICACIDVEN in the last 168 hours.  Recent Results (from the past 240 hour(s))  SARS Coronavirus 2 (CEPHEID- Performed in [facility] lab), Hosp Order     Status: None   Collection Time: 07/17/19  9:49 PM   Specimen: Nasopharyngeal Swab  Result Value Ref Range Status   SARS Coronavirus 2 NEGATIVE NEGATIVE Final    Comment: (NOTE) If result is NEGATIVE SARS-CoV-2 target nucleic acids are NOT DETECTED. The SARS-CoV-2 RNA is generally detectable in upper and lower  respiratory specimens during the acute phase of infection. The lowest  concentration of SARS-CoV-2 viral copies this assay can detect is 250  copies / mL. A negative result does not preclude SARS-CoV-2 infection  and should not be used as the sole basis for treatment or other  patient management decisions.  A negative result may occur with  improper specimen collection / handling, submission of specimen other  than  nasopharyngeal swab, presence of viral mutation(s) within the  areas targeted by this assay, and inadequate number of viral copies  (<250 copies / mL). A negative result must be combined with clinical  observations, patient history, and epidemiological information. If result is POSITIVE SARS-CoV-2 target nucleic acids are DETECTED. The SARS-CoV-2 RNA is generally detectable in upper and lower  respiratory specimens dur ing the acute phase of infection.  Positive  results are indicative of active infection with SARS-CoV-2.  Clinical  correlation with patient history and other diagnostic information is  necessary to determine patient infection status.  Positive results do  not rule out bacterial infection or co-infection with other viruses. If result is PRESUMPTIVE POSTIVE SARS-CoV-2 nucleic acids MAY BE PRESENT.   A presumptive positive result was obtained on the submitted specimen  and confirmed on repeat testing.  While 2019 novel coronavirus  (SARS-CoV-2) nucleic acids may be present in the submitted sample  additional confirmatory testing may be necessary for epidemiological  and / or clinical management purposes  to differentiate between  SARS-CoV-2 and other Sarbecovirus currently known to infect humans.  If clinically indicated additional testing with an alternate test  methodology [phone]([id number]) is advised. The SARS-CoV-2 RNA is generally  detectable in upper and lower respiratory sp ecimens during the acute  phase of infection. The expected result is Negative. Fact Sheet for Patients:  BoilerBrush.com.cyhttps://www.fda.gov/media/136312/download Fact Sheet for Healthcare Providers: https://pope.com/https://www.fda.gov/media/136313/download This test is not yet approved or cleared by the [place] FDA and has been authorized for detection and/or diagnosis of SARS-CoV-2 by FDA under an Emergency Use Authorization (EUA).  This EUA will remain in effect (meaning this test can be used) for the duration of  the COVID-19 declaration under Section 564(b)(1) of the Act, 21 U.S.C. section 360bbb-3(b)(1), unless the authorization is terminated or revoked sooner. Performed at [facility], [address] [place], [place] [address]       Radiology Studies: Ct Abdomen Pelvis W Contrast  Result Date: 07/17/2019 CLINICAL DATA:  Nausea/vomiting, epigastric abdominal pain EXAM: CT ABDOMEN AND PELVIS WITH CONTRAST TECHNIQUE: Multidetector CT imaging of the abdomen and pelvis was performed using the standard protocol following bolus administration of intravenous contrast. CONTRAST:  100mL OMNIPAQUE IOHEXOL 300 MG/ML  SOLN COMPARISON:  None. FINDINGS: Lower chest: 11 x 7 x 14 mm irregular nodule in the left lower lobe (series 4/image 13), indeterminate. Hepatobiliary: Liver is within normal limits. Gallbladder is unremarkable. No intrahepatic or extrahepatic ductal dilatation. Pancreas: Within normal limits. Spleen: Within normal limits. Adrenals/Urinary Tract: Adrenal glands are within normal limits. Kidneys are within normal limits.  No hydronephrosis. Bladder is within normal limits. Stomach/Bowel: Prominent rugal folds, nonspecific. Mild gastric wall edema along the lesser curvature with a suspected gastric ulcer (series 2/image 26). No associated free air to suggest perforation. No evidence of bowel obstruction. Normal appendix (series 2/image 55). Vascular/Lymphatic: No evidence of abdominal aortic aneurysm. Mild atherosclerotic calcifications the abdominal aorta. No suspicious abdominopelvic lymphadenopathy. Reproductive: Prostate is unremarkable. Other: No abdominopelvic ascites. Musculoskeletal: Degenerative changes at L5-S1. IMPRESSION: Suspected gastric ulcer with surrounding gastric wall edema along the lesser curvature of the stomach. No associated free air to suggest perforation. Consider endoscopy for further evaluation. 14 mm irregular left lower lobe nodule, indeterminate. No prior  imaging is available for comparison, and therefore infection/inflammation remains within the differential. Consider short-term follow-up CT chest in 4-6 weeks to assess for persistence. If present at that time, this would be concerning for malignancy, and PET-CT would be the appropriate next step. These results were called by telephone at the time of interpretation on 07/17/2019 at 10:30 pm to [facility] PA, who verbally acknowledged these results. Electronically Signed   By: Charline BillsSriyesh  Krishnan M.D.   On: 07/17/2019 22:32   [place] Abdomen Limited Ruq  Result Date: 07/16/2019 CLINICAL DATA:  Abdominal pain. EXAM: ULTRASOUND ABDOMEN LIMITED RIGHT UPPER QUADRANT COMPARISON:  Abdominal series 11/20/2011. FINDINGS: Gallbladder: No gallstones or wall thickening visualized. No sonographic Murphy sign noted by sonographer. Common bile duct: Diameter: 4.4 Liver: No focal lesion identified. Within normal limits in parenchymal echogenicity. Portal vein is patent on color Doppler imaging with normal direction of blood flow towards the liver. IMPRESSION: Negative exam.  No gallstones or biliary distention. Electronically Signed   By: Maisie Fushomas  Register   On: 07/16/2019 12:40      Scheduled Meds:  nicotine  21 mg Transdermal Daily   Continuous Infusions:  [START ON 07/19/2019] ferumoxytol     pantoprozole (PROTONIX) infusion 8 mg/hr (07/18/19 0141)     LOS: 1 day  Time spent: 35 minutes   Noralee StainJennifer Diarra Ceja, DO [facility] [email or web address] 07/18/2019, 12:11 PM

## 2019-07-18 NOTE — ED Notes (Signed)
Pt asked to provide urine specimen again, pt denied wanting to provide one. Pt asked if he needed to void, being that he had not voided during the shift with this Probation officer and pt stated "yeah".

## 2019-07-18 NOTE — Brief Op Note (Signed)
Cratered clean-based gastric ulcer in lesser curvature and several superficial ulcers in antrum. Biopsies taken of surrounding mucosa. Clear liquid diet. Continue Protonix drip but change to IV Q 12 hours tomorrow if stable. Will f/u.

## 2019-07-18 NOTE — ED Notes (Signed)
RN spoke with Meagan in pharmacy and they will be sending up another bag of Protonix ASAP.

## 2019-07-18 NOTE — ED Notes (Signed)
Pt sleeping at this time. Will reassess pt for urine specimen once pt wakes up.

## 2019-07-18 NOTE — ED Notes (Signed)
ED TO INPATIENT HANDOFF REPORT  Name/Age/Gender William Horne 43 y.o. male  Code Status    Code Status Orders  (From admission, onward)         Start     Ordered   07/17/19 2352  Full code  Continuous     07/17/19 2353        Code Status History    This patient has a current code status but no historical code status.   Advance Care Planning Activity      Home/SNF/Other Home  Chief Complaint Epigastric abdominal pain [R10.13] Nodule of left lung [R91.1] Intractable nausea and vomiting [R11.2]  Level of Care/Admitting Diagnosis ED Disposition    ED Disposition Condition Comment   Admit  Hospital Area: [facility] [[id number]]  Level of Care: Med-Surg [16]  Covid Evaluation: Asymptomatic Screening Protocol (No Symptoms)  Diagnosis: Gastric ulcer, unspecified as acute or chronic, without hemorrhage or perforation [[id number]]  Admitting Physician: Charlsie Quest [[id number]]  Attending Physician: Charlsie Quest [[id number]]  Estimated length of stay: past midnight tomorrow  Certification:: I certify this patient will need inpatient services for at least 2 midnights  PT Class (Do Not Modify): Inpatient [101]  PT Acc Code (Do Not Modify): Private [1]       Medical History Past Medical History:  Diagnosis Date  . Dental caries   . Eczema   . Gastritis   . Hepatitis C   . Shoulder dislocation     Allergies Allergies  Allergen Reactions  . Asa [Aspirin] Nausea And Vomiting    IV Location/Drains/Wounds Patient Lines/Drains/Airways Status   Active Line/Drains/Airways    Name:   Placement date:   Placement time:   Site:   Days:   Peripheral IV 07/18/19 Left Forearm   07/18/19    0527    Forearm   less than 1          Labs/Imaging Results for orders placed or performed during the hospital encounter of 07/17/19 (from the past 48 hour(s))  Lipase, blood     Status: None   Collection Time: 07/17/19  5:52 PM  Result Value Ref Range   Lipase 28 11 - 51 U/L    Comment: Performed at [facility], [address], [place], [place] [address]  Comprehensive metabolic panel     Status: Abnormal   Collection Time: 07/17/19  5:52 PM  Result Value Ref Range   Sodium 137 135 - 145 mmol/L   Potassium 3.7 3.5 - 5.1 mmol/L   Chloride 99 98 - 111 mmol/L   CO2 27 22 - 32 mmol/L   Glucose, Bld 115 (H) 70 - 99 mg/dL   BUN 26 (H) 6 - 20 mg/dL   Creatinine, Ser 3.08 (H) 0.61 - 1.24 mg/dL   Calcium 9.3 8.9 - 65.7 mg/dL   Total Protein 8.2 (H) 6.5 - 8.1 g/dL   Albumin 4.3 3.5 - 5.0 g/dL   AST 47 (H) 15 - 41 U/L   ALT 26 0 - 44 U/L   Alkaline Phosphatase 46 38 - 126 U/L   Total Bilirubin 0.9 0.3 - 1.2 mg/dL   GFR calc non Af Amer >60 >60 mL/min   GFR calc Af Amer >60 >60 mL/min   Anion gap 11 5 - 15    Comment: Performed at [facility], [address], [place], [place] [address]  CBC     Status: Abnormal   Collection Time: 07/17/19  5:52  PM  Result Value Ref Range   WBC 10.8 (H) 4.0 - 10.5 K/uL   RBC 6.02 (H) 4.22 - 5.81 MIL/uL   Hemoglobin 11.9 (L) 13.0 - 17.0 g/dL   HCT 60.441.6 54.039.0 - 98.152.0 %   MCV 69.1 (L) 80.0 - 100.0 fL   MCH 19.8 (L) 26.0 - 34.0 pg   MCHC 28.6 (L) 30.0 - 36.0 g/dL   RDW 19.121.4 (H) 47.811.5 - 29.515.5 %   Platelets 406 (H) 150 - 400 K/uL   nRBC 0.0 0.0 - 0.2 %    Comment: Performed at [facility], [address] [place], [place] [address]  Urinalysis, Routine w reflex microscopic     Status: Abnormal   Collection Time: 07/17/19  7:48 PM  Result Value Ref Range   Color, Urine YELLOW YELLOW   APPearance CLEAR CLEAR   Specific Gravity, Urine >1.046 (H) 1.005 - 1.030   pH 7.0 5.0 - 8.0   Glucose, UA NEGATIVE NEGATIVE mg/dL   Hgb urine dipstick NEGATIVE NEGATIVE   Bilirubin Urine NEGATIVE NEGATIVE   Ketones, ur 5 (A) NEGATIVE mg/dL   Protein, ur 30 (A) NEGATIVE mg/dL   Nitrite NEGATIVE NEGATIVE   Leukocytes,Ua NEGATIVE NEGATIVE   RBC / HPF 11-20 0 - 5  RBC/hpf   WBC, UA 0-5 0 - 5 WBC/hpf   Bacteria, UA NONE SEEN NONE SEEN   Mucus PRESENT     Comment: Performed at [facility], [address] [place], [place] [address]  Rapid urine drug screen (hospital performed)     Status: Abnormal   Collection Time: 07/17/19  7:48 PM  Result Value Ref Range   Opiates NONE DETECTED NONE DETECTED   Cocaine NONE DETECTED NONE DETECTED   Benzodiazepines NONE DETECTED NONE DETECTED   Amphetamines NONE DETECTED NONE DETECTED   Tetrahydrocannabinol POSITIVE (A) NONE DETECTED   Barbiturates NONE DETECTED NONE DETECTED    Comment: (NOTE) DRUG SCREEN FOR MEDICAL PURPOSES ONLY.  IF CONFIRMATION IS NEEDED FOR ANY PURPOSE, NOTIFY LAB WITHIN 5 DAYS. LOWEST DETECTABLE LIMITS FOR URINE DRUG SCREEN Drug Class                     Cutoff (ng/mL) Amphetamine and metabolites    1000 Barbiturate and metabolites    200 Benzodiazepine                 200 Tricyclics and metabolites     300 Opiates and metabolites        300 Cocaine and metabolites        300 THC                            50 Performed at [facility], [address] [place], [place] [address]   POC occult blood, ED     Status: None   Collection Time: 07/17/19  8:52 PM  Result Value Ref Range   Fecal Occult Bld NEGATIVE NEGATIVE  SARS Coronavirus 2 (CEPHEID- Performed in [facility] hospital lab), Hosp Order     Status: None   Collection Time: 07/17/19  9:49 PM   Specimen: Nasopharyngeal Swab  Result Value Ref Range   SARS Coronavirus 2 NEGATIVE NEGATIVE    Comment: (NOTE) If result is NEGATIVE SARS-CoV-2 target nucleic acids are NOT DETECTED. The SARS-CoV-2 RNA is generally detectable in upper and lower  respiratory specimens during the acute phase of infection. The lowest  concentration of SARS-CoV-2 viral copies this assay can detect is 250  copies / mL. A negative result does not preclude SARS-CoV-2 infection  and should not be used as the sole  basis for treatment or other  patient management decisions.  A negative result may occur with  improper specimen collection / handling, submission of specimen other  than nasopharyngeal swab, presence of viral mutation(s) within the  areas targeted by this assay, and inadequate number of viral copies  (<250 copies / mL). A negative result must be combined with clinical  observations, patient history, and epidemiological information. If result is POSITIVE SARS-CoV-2 target nucleic acids are DETECTED. The SARS-CoV-2 RNA is generally detectable in upper and lower  respiratory specimens dur ing the acute phase of infection.  Positive  results are indicative of active infection with SARS-CoV-2.  Clinical  correlation with patient history and other diagnostic information is  necessary to determine patient infection status.  Positive results do  not rule out bacterial infection or co-infection with other viruses. If result is PRESUMPTIVE POSTIVE SARS-CoV-2 nucleic acids MAY BE PRESENT.   A presumptive positive result was obtained on the submitted specimen  and confirmed on repeat testing.  While 2019 novel coronavirus  (SARS-CoV-2) nucleic acids may be present in the submitted sample  additional confirmatory testing may be necessary for epidemiological  and / or clinical management purposes  to differentiate between  SARS-CoV-2 and other Sarbecovirus currently known to infect humans.  If clinically indicated additional testing with an alternate test  methodology [phone]) is advised. The SARS-CoV-2 RNA is generally  detectable in upper and lower respiratory sp ecimens during the acute  phase of infection. The expected result is Negative. Fact Sheet for Patients:  BoilerBrush.com.cyhttps://www.fda.gov/media/136312/download Fact Sheet for Healthcare Providers: https://pope.com/https://www.fda.gov/media/136313/download This test is not yet approved or cleared by the [place] FDA and has been authorized for detection  and/or diagnosis of SARS-CoV-2 by FDA under an Emergency Use Authorization (EUA).  This EUA will remain in effect (meaning this test can be used) for the duration of the COVID-19 declaration under Section 564(b)(1) of the Act, 21 U.S.C. section 360bbb-3(b)(1), unless the authorization is terminated or revoked sooner. Performed at [facility], [address] [place], [place] [address]   Ferritin     Status: Abnormal   Collection Time: 07/18/19 12:00 AM  Result Value Ref Range   Ferritin 11 (L) 24 - 336 ng/mL    Comment: Performed at [facility], [address] [place], [place] [address]  CBC     Status: Abnormal   Collection Time: 07/18/19  5:28 AM  Result Value Ref Range   WBC 8.5 4.0 - 10.5 K/uL   RBC 4.91 4.22 - 5.81 MIL/uL   Hemoglobin 9.7 (L) 13.0 - 17.0 g/dL   HCT 47.833.6 (L) 29.539.0 - 62.152.0 %   MCV 68.4 (L) 80.0 - 100.0 fL   MCH 19.8 (L) 26.0 - 34.0 pg   MCHC 28.9 (L) 30.0 - 36.0 g/dL   RDW 30.820.8 (H) 65.711.5 - 84.615.5 %   Platelets 341 150 - 400 K/uL   nRBC 0.0 0.0 - 0.2 %    Comment: Performed at [facility], [address] [place], [place] [address]  Basic metabolic panel     Status: Abnormal   Collection Time: 07/18/19  5:28 AM  Result Value Ref Range   Sodium 138 135 - 145 mmol/L   Potassium 3.7 3.5 - 5.1 mmol/L   Chloride  105 98 - 111 mmol/L   CO2 25 22 - 32 mmol/L   Glucose, Bld 96 70 - 99 mg/dL   BUN 23 (H) 6 - 20 mg/dL   Creatinine, Ser 7.821.15 0.61 - 1.24 mg/dL   Calcium 8.3 (L) 8.9 - 10.3 mg/dL   GFR calc non Af Amer >60 >60 mL/min   GFR calc Af Amer >60 >60 mL/min   Anion gap 8 5 - 15    Comment: Performed at [facility], [address] [place], [place] [address]   Ct Abdomen Pelvis W Contrast  Result Date: 07/17/2019 CLINICAL DATA:  Nausea/vomiting, epigastric abdominal pain EXAM: CT ABDOMEN AND PELVIS WITH CONTRAST TECHNIQUE: Multidetector CT imaging of the abdomen and pelvis was  performed using the standard protocol following bolus administration of intravenous contrast. CONTRAST:  100mL OMNIPAQUE IOHEXOL 300 MG/ML  SOLN COMPARISON:  None. FINDINGS: Lower chest: 11 x 7 x 14 mm irregular nodule in the left lower lobe (series 4/image 13), indeterminate. Hepatobiliary: Liver is within normal limits. Gallbladder is unremarkable. No intrahepatic or extrahepatic ductal dilatation. Pancreas: Within normal limits. Spleen: Within normal limits. Adrenals/Urinary Tract: Adrenal glands are within normal limits. Kidneys are within normal limits.  No hydronephrosis. Bladder is within normal limits. Stomach/Bowel: Prominent rugal folds, nonspecific. Mild gastric wall edema along the lesser curvature with a suspected gastric ulcer (series 2/image 26). No associated free air to suggest perforation. No evidence of bowel obstruction. Normal appendix (series 2/image 55). Vascular/Lymphatic: No evidence of abdominal aortic aneurysm. Mild atherosclerotic calcifications the abdominal aorta. No suspicious abdominopelvic lymphadenopathy. Reproductive: Prostate is unremarkable. Other: No abdominopelvic ascites. Musculoskeletal: Degenerative changes at L5-S1. IMPRESSION: Suspected gastric ulcer with surrounding gastric wall edema along the lesser curvature of the stomach. No associated free air to suggest perforation. Consider endoscopy for further evaluation. 14 mm irregular left lower lobe nodule, indeterminate. No prior imaging is available for comparison, and therefore infection/inflammation remains within the differential. Consider short-term follow-up CT chest in 4-6 weeks to assess for persistence. If present at that time, this would be concerning for malignancy, and PET-CT would be the appropriate next step. These results were called by telephone at the time of interpretation on 07/17/2019 at 10:30 pm to [facility]IBBONS, PA, who verbally acknowledged these results. Electronically Signed   By: Charline BillsSriyesh  Krishnan  M.D.   On: 07/17/2019 22:32    Pending Labs Unresulted Labs (From admission, onward)    Start     Ordered   07/19/19 0500  CBC  Daily,   R     07/18/19 1209   07/19/19 0500  Basic metabolic panel  Tomorrow morning,   R     07/18/19 1209   07/17/19 2353  HIV antibody (Routine Testing)  Add-on,   AD     07/17/19 2353   07/17/19 1934  Hepatitis panel, acute  ONCE - STAT,   STAT     07/17/19 1933          Vitals/Pain Today's Vitals   07/18/19 1000 07/18/19 1100 07/18/19 1209 07/18/19 1300  BP: 126/82 112/78 114/88 127/84  Pulse: (!) 58 62 65 63  Resp: 12 14 11 16   Temp:      TempSrc:      SpO2: 100% 100% 100% 100%  Weight:      Height:      PainSc:        Isolation Precautions No active isolations  Medications Medications  pantoprazole (PROTONIX) 80 mg in sodium chloride 0.9 %  250 mL (0.32 mg/mL) infusion (0 mg/hr Intravenous Stopped 07/18/19 1220)  acetaminophen (TYLENOL) tablet 650 mg ( Oral MAR Hold 07/18/19 1324)    Or  acetaminophen (TYLENOL) suppository 650 mg ( Rectal MAR Hold 07/18/19 1324)  ondansetron (ZOFRAN) tablet 4 mg ( Oral MAR Hold 07/18/19 1324)    Or  ondansetron (ZOFRAN) injection 4 mg ( Intravenous MAR Hold 07/18/19 1324)  nicotine (NICODERM CQ - dosed in mg/24 hours) patch 21 mg ( Transdermal Automatically Held 07/26/19 1000)  ferumoxytol (FERAHEME) 510 mg in sodium chloride 0.9 % 100 mL IVPB ( Intravenous Automatically Held 07/19/19 0700)  sodium chloride flush (NS) 0.9 % injection 3 mL (3 mLs Intravenous Given 07/17/19 1949)  sodium chloride 0.9 % bolus 1,000 mL (0 mLs Intravenous Stopped 07/17/19 2146)  metoCLOPramide (REGLAN) injection 10 mg (10 mg Intravenous Given 07/17/19 1950)  diphenhydrAMINE (BENADRYL) injection 25 mg (25 mg Intravenous Given 07/17/19 1949)  prochlorperazine (COMPAZINE) injection 10 mg (10 mg Intravenous Given 07/17/19 2047)  promethazine (PHENERGAN) injection 12.5 mg (12.5 mg Intravenous Given 07/17/19 2046)  iohexol (OMNIPAQUE) 300  MG/ML solution 100 mL (100 mLs Intravenous Contrast Given 07/17/19 2157)  sodium chloride (PF) 0.9 % injection (  Given by Other 07/17/19 2157)  pantoprazole (PROTONIX) injection 40 mg (40 mg Intravenous Given 07/18/19 0005)  pantoprazole (PROTONIX) 80 mg in sodium chloride 0.9 % 100 mL IVPB (0 mg Intravenous Stopped 07/18/19 0030)  fentaNYL (SUBLIMAZE) 100 MCG/2ML injection (has no administration in time range)  propofol (DIPRIVAN) 10 mg/mL bolus/IV push (has no administration in time range)    Mobility walks

## 2019-07-19 LAB — BASIC METABOLIC PANEL
Anion gap: 6 (ref 5–15)
BUN: 14 mg/dL (ref 6–20)
CO2: 26 mmol/L (ref 22–32)
Calcium: 8.4 mg/dL — ABNORMAL LOW (ref 8.9–10.3)
Chloride: 106 mmol/L (ref 98–111)
Creatinine, Ser: 1.08 mg/dL (ref 0.61–1.24)
GFR calc Af Amer: >60 mL/min (ref >60)
GFR calc non Af Amer: >60 mL/min (ref >60)
Glucose, Bld: 86 mg/dL (ref 70–99)
Potassium: 3.8 mmol/L (ref 3.5–5.1)
Sodium: 138 mmol/L (ref 135–145)

## 2019-07-19 LAB — HEPATITIS PANEL, ACUTE
HCV Ab: <0.1 s/co ratio (ref 0.0–0.9)
Hep A IgM: NEGATIVE
Hep B C IgM: NEGATIVE
Hepatitis B Surface Ag: NEGATIVE

## 2019-07-19 LAB — CBC
HCT: 31.4 % — ABNORMAL LOW (ref 39.0–52.0)
Hemoglobin: 9.2 g/dL — ABNORMAL LOW (ref 13.0–17.0)
MCH: 20.3 pg — ABNORMAL LOW (ref 26.0–34.0)
MCHC: 29.3 g/dL — ABNORMAL LOW (ref 30.0–36.0)
MCV: 69.3 fL — ABNORMAL LOW (ref 80.0–100.0)
Platelets: 300 10*3/uL (ref 150–400)
RBC: 4.53 MIL/uL (ref 4.22–5.81)
RDW: 21.1 % — ABNORMAL HIGH (ref 11.5–15.5)
WBC: 6 10*3/uL (ref 4.0–10.5)
nRBC: 0 % (ref 0.0–0.2)

## 2019-07-19 MED ORDER — SUCRALFATE 1 G PO TABS
1.0000 g | ORAL_TABLET | Freq: Four times a day (QID) | ORAL | Status: DC
  Administered 2019-07-19 – 2019-07-20 (×5): 1 g via ORAL
  Filled 2019-07-19 (×5): qty 1

## 2019-07-19 NOTE — Progress Notes (Addendum)
Pt looks and feels well.  Per RN, no report of stools.  BUN coming down, from 23 to 14.  Hemoglobin drifting down consistent with equilibration, from 9.7 to 9.2.  Interestingly, patient's stool was Hemoccult negative at time of admission.  Importance of smoking cessation emphasized to patient; even on repeated questioning today, he indicates no recent exposure to ulcerogenic medications to account for his ulcer.  He is aware that biopsies are pending and that we will need to treat for Helicobacter pylori infection if there is evidence of it on the biopsies.  Impression: No evidence of active bleeding  Plan: Will advance diet, and add sucralfate to the regimen, and hopefully transition to oral PPI therapy tomorrow.  In view of the somewhat dirty appearance of the base of his ulcer as seen in the endoscopic photographs, I think it would be prudent to have as intensive antipeptic therapy as possible for the next 24 hours to help reduce the risk of rebleeding.  William Horne, M.D. Pager [phone] If no answer or after 5 PM call [phone]

## 2019-07-19 NOTE — Plan of Care (Signed)
  Problem: Safety: Goal: Ability to remain free from injury will improve Outcome: Progressing   Problem: Elimination: Goal: Will not experience complications related to urinary retention Outcome: Progressing   Problem: Nutrition: Goal: Adequate nutrition will be maintained Outcome: Progressing   Problem: Activity: Goal: Risk for activity intolerance will decrease Outcome: Progressing   Problem: Clinical Measurements: Goal: Will remain free from infection Outcome: Progressing

## 2019-07-19 NOTE — Progress Notes (Signed)
PROGRESS NOTE    William Horne  ZOX:[id number] DOB: 03/10/1976 DOA: 07/17/2019 PCP: Patient, No Pcp Per     Brief Narrative:  William MonsRobert Leon Eisenberger is a 43 y.o. male with medical history significant for gastritis, reported history of hepatitis C, tobacco use, and marijuana use with suspected history of cannabinoid hyperemesis syndrome who presents to the ED for evaluation of persistent nausea and vomiting.  Patient states symptoms began 4 days ago and he has been unable to maintain any adequate oral intake.  He reports mostly clear emesis but has seen occasional streaks of blood or brown-colored emesis.  He denies any coffee-ground appearance.  He has intermittent abdominal pain associated with his vomiting episodes.  He reports seeing dark black-colored stool without bright red blood.  He reports occasional chills.  He denies any subjective fevers, chest pain, dyspnea, or dysuria.  Patient admitted for concern for gastric ulcer and gastritis.  New events last 24 hours / Subjective: Doing well this morning, has not had a bowel movement yet.  Tolerating clear liquid diet without any issues  Assessment & Plan:   Principal Problem:   Gastric ulcer, unspecified as acute or chronic, without hemorrhage or perforation Active Problems:   Tobacco use   Marijuana use   Nausea and vomiting   Microcytic anemia   Gastric ulcer with acute gastritis -CT abdomen pelvis revealed suspected gastric ulcer with surrounding gastric wall edema along the lesser curvature of the stomach, without free air to suggest perforation -FOBT negative -EGD 7/29 by Dr. Bosie ClosSchooler: Esophagitis, nonbleeding gastric ulcer, acute gastritis -Awaiting biopsy results -Protonix drip, GI recommends intensive antipeptic therapy for the next 24 hours to help reduce risk of rebleeding -Trend hemoglobin, stable currently 9.2 -GI following -Advance diet  Iron deficiency anemia -Ferritin 11 -IV Feraheme 7/29  Incidental finding  of left lower lobe nodule -Need follow-up with outpatient CT chest in 4 to 6 weeks  Tobacco abuse -Nicotine patch, cessation counseling        DVT prophylaxis: SCD Code Status: Full code Family Communication: None Disposition Plan: Pending further stabilization, continue IV Protonix for the next 24 hours.  Hopefully home 7/31   Consultants:   GI  Procedures:   None  Antimicrobials:  Anti-infectives (From admission, onward)   None       Objective: Vitals:   07/18/19 1450 07/18/19 1500 07/18/19 2024 07/19/19 0517  BP:  136/82 120/74 (!) 145/75  Pulse: (!) 55 62 (!) 59 62  Resp: 17 16 18 18   Temp:   98.1 F (36.7 C) 98.6 F (37 C)  TempSrc:   Oral Oral  SpO2: 100% 100% 100% 100%  Weight:    89.4 kg  Height:        Intake/Output Summary (Last 24 hours) at 07/19/2019 1054 Last data filed at 07/19/2019 0200 Gross per 24 hour  Intake 1685.18 ml  Output --  Net 1685.18 ml   Filed Weights   07/17/19 1447 07/18/19 1335 07/19/19 0517  Weight: 88.5 kg 88.5 kg 89.4 kg    Examination: General exam: Appears calm and comfortable  Respiratory system: Clear to auscultation. Respiratory effort normal. Cardiovascular system: S1 & S2 heard, RRR. No JVD, murmurs, rubs, gallops or clicks. No pedal edema. Gastrointestinal system: Abdomen is nondistended, soft and nontender. No organomegaly or masses felt. Normal bowel sounds heard. Central nervous system: Alert and oriented. No focal neurological deficits. Extremities: Symmetric 5 x 5 power. Skin: No rashes, lesions or ulcers Psychiatry: Judgement and insight appear normal.  Mood & affect appropriate.   Data Reviewed: I have personally reviewed following labs and imaging studies  CBC: Recent Labs  Lab 07/16/19 1215 07/17/19 1752 07/18/19 0528 07/19/19 0521  WBC 9.1 10.8* 8.5 6.0  HGB 12.6* 11.9* 9.7* 9.2*  HCT 43.4 41.6 33.6* 31.4*  MCV 67.9* 69.1* 68.4* 69.3*  PLT 420* 406* 341 300   Basic Metabolic  Panel: Recent Labs  Lab 07/16/19 1215 07/17/19 1752 07/18/19 0528 07/19/19 0521  NA 137 137 138 138  K 3.7 3.7 3.7 3.8  CL 99 99 105 106  CO2 25 27 25 26   GLUCOSE 108* 115* 96 86  BUN 30* 26* 23* 14  CREATININE 1.33* 1.26* 1.15 1.08  CALCIUM 9.5 9.3 8.3* 8.4*   GFR: Estimated Creatinine Clearance: 109.4 mL/min (by C-G formula based on SCr of 1.08 mg/dL). Liver Function Tests: Recent Labs  Lab 07/16/19 1215 07/17/19 1752  AST 36 47*  ALT 21 26  ALKPHOS 48 46  BILITOT 1.1 0.9  PROT 8.9* 8.2*  ALBUMIN 4.5 4.3   Recent Labs  Lab 07/16/19 1215 07/17/19 1752  LIPASE 24 28   No results for input(s): AMMONIA in the last 168 hours. Coagulation Profile: No results for input(s): INR, PROTIME in the last 168 hours. Cardiac Enzymes: No results for input(s): CKTOTAL, CKMB, CKMBINDEX, TROPONINI in the last 168 hours. BNP (last 3 results) No results for input(s): PROBNP in the last 8760 hours. HbA1C: No results for input(s): HGBA1C in the last 72 hours. CBG: No results for input(s): GLUCAP in the last 168 hours. Lipid Profile: No results for input(s): CHOL, HDL, LDLCALC, TRIG, CHOLHDL, LDLDIRECT in the last 72 hours. Thyroid Function Tests: No results for input(s): TSH, T4TOTAL, FREET4, T3FREE, THYROIDAB in the last 72 hours. Anemia Panel: Recent Labs    07/18/19 0000  FERRITIN 11*   Sepsis Labs: No results for input(s): PROCALCITON, LATICACIDVEN in the last 168 hours.  Recent Results (from the past 240 hour(s))  SARS Coronavirus 2 (CEPHEID- [facility] Hosp Order     Status: None   Collection Time: 07/17/19  9:49 PM   Specimen: Nasopharyngeal Swab  Result Value Ref Range Status   SARS Coronavirus 2 NEGATIVE NEGATIVE Final    Comment: (NOTE) If result is NEGATIVE SARS-CoV-2 target nucleic acids are NOT DETECTED. The SARS-CoV-2 RNA is generally detectable in upper and lower  respiratory specimens during the acute phase of infection. The  lowest  concentration of SARS-CoV-2 viral copies this assay can detect is 250  copies / mL. A negative result does not preclude SARS-CoV-2 infection  and should not be used as the sole basis for treatment or other  patient management decisions.  A negative result may occur with  improper specimen collection / handling, submission of specimen other  than nasopharyngeal swab, presence of viral mutation(s) within the  areas targeted by this assay, and inadequate number of viral copies  (<250 copies / mL). A negative result must be combined with clinical  observations, patient history, and epidemiological information. If result is POSITIVE SARS-CoV-2 target nucleic acids are DETECTED. The SARS-CoV-2 RNA is generally detectable in upper and lower  respiratory specimens dur ing the acute phase of infection.  Positive  results are indicative of active infection with SARS-CoV-2.  Clinical  correlation with patient history and other diagnostic information is  necessary to determine patient infection status.  Positive results do  not rule out bacterial infection or co-infection with other viruses. If result  is PRESUMPTIVE POSTIVE SARS-CoV-2 nucleic acids MAY BE PRESENT.   A presumptive positive result was obtained on the submitted specimen  and confirmed on repeat testing.  While 2019 novel coronavirus  (SARS-CoV-2) nucleic acids may be present in the submitted sample  additional confirmatory testing may be necessary for epidemiological  and / or clinical management purposes  to differentiate between  SARS-CoV-2 and other Sarbecovirus currently known to infect humans.  If clinically indicated additional testing with an alternate test  methodology [phone]) is advised. The SARS-CoV-2 RNA is generally  detectable in upper and lower respiratory sp ecimens during the acute  phase of infection. The expected result is Negative. Fact Sheet for Patients:   StrictlyIdeas.no Fact Sheet for Healthcare Providers: BankingDealers.co.za This test is not yet approved or cleared by the [place] FDA and has been authorized for detection and/or diagnosis of SARS-CoV-2 by FDA under an Emergency Use Authorization (EUA).  This EUA will remain in effect (meaning this test can be used) for the duration of the COVID-19 declaration under Section 564(b)(1) of the Act, 21 U.S.C. section 360bbb-3(b)(1), unless the authorization is terminated or revoked sooner. Performed at [facility], [place] [address], [place], [place] [address]       Radiology Studies: Ct Abdomen Pelvis W Contrast  Result Date: 07/17/2019 CLINICAL DATA:  Nausea/vomiting, epigastric abdominal pain EXAM: CT ABDOMEN AND PELVIS WITH CONTRAST TECHNIQUE: Multidetector CT imaging of the abdomen and pelvis was performed using the standard protocol following bolus administration of intravenous contrast. CONTRAST:  127mL OMNIPAQUE IOHEXOL 300 MG/ML  SOLN COMPARISON:  None. FINDINGS: Lower chest: 11 x 7 x 14 mm irregular nodule in the left lower lobe (series 4/image 13), indeterminate. Hepatobiliary: Liver is within normal limits. Gallbladder is unremarkable. No intrahepatic or extrahepatic ductal dilatation. Pancreas: Within normal limits. Spleen: Within normal limits. Adrenals/Urinary Tract: Adrenal glands are within normal limits. Kidneys are within normal limits.  No hydronephrosis. Bladder is within normal limits. Stomach/Bowel: Prominent rugal folds, nonspecific. Mild gastric wall edema along the lesser curvature with a suspected gastric ulcer (series 2/image 26). No associated free air to suggest perforation. No evidence of bowel obstruction. Normal appendix (series 2/image 55). Vascular/Lymphatic: No evidence of abdominal aortic aneurysm. Mild atherosclerotic calcifications the abdominal aorta. No suspicious abdominopelvic  lymphadenopathy. Reproductive: Prostate is unremarkable. Other: No abdominopelvic ascites. Musculoskeletal: Degenerative changes at L5-S1. IMPRESSION: Suspected gastric ulcer with surrounding gastric wall edema along the lesser curvature of the stomach. No associated free air to suggest perforation. Consider endoscopy for further evaluation. 14 mm irregular left lower lobe nodule, indeterminate. No prior imaging is available for comparison, and therefore infection/inflammation remains within the differential. Consider short-term follow-up CT chest in 4-6 weeks to assess for persistence. If present at that time, this would be concerning for malignancy, and PET-CT would be the appropriate next step. These results were called by telephone at the time of interpretation on 07/17/2019 at 10:30 pm to [facility], who verbally acknowledged these results. Electronically Signed   By: Julian Hy M.D.   On: 07/17/2019 22:32      Scheduled Meds:  nicotine  21 mg Transdermal Daily   sucralfate  1 g Oral QID   Continuous Infusions:  pantoprozole (PROTONIX) infusion Stopped (07/19/19 1005)     LOS: 2 days      Time spent: 25 minutes   Dessa Phi, DO [facility] [email or web address] 07/19/2019, 10:54 AM

## 2019-07-20 LAB — CBC
HCT: 32.3 % — ABNORMAL LOW (ref 39.0–52.0)
Hemoglobin: 9.4 g/dL — ABNORMAL LOW (ref 13.0–17.0)
MCH: 20.2 pg — ABNORMAL LOW (ref 26.0–34.0)
MCHC: 29.1 g/dL — ABNORMAL LOW (ref 30.0–36.0)
MCV: 69.5 fL — ABNORMAL LOW (ref 80.0–100.0)
Platelets: 308 10*3/uL (ref 150–400)
RBC: 4.65 MIL/uL (ref 4.22–5.81)
RDW: 21.2 % — ABNORMAL HIGH (ref 11.5–15.5)
WBC: 5.2 10*3/uL (ref 4.0–10.5)
nRBC: 0 % (ref 0.0–0.2)

## 2019-07-20 MED ORDER — OMEPRAZOLE 40 MG PO CPDR: 40.0000 mg | DELAYED_RELEASE_CAPSULE | Freq: Every day | ORAL | 2 refills | Status: DC

## 2019-07-20 NOTE — Progress Notes (Signed)
Doing well.  No bowel movements.  Hemoglobin stable (slightly actually improved from yesterday).  Eating well.  Gastric biopsies still pending.    Impression:   1. GIB from gastric ulcer, quiescent.  Reason for GU unclear (no NSAID's) 2. Post-hemorrhagic anemia, stable 3. Hep C Ab NEGATIVE, so reported h/o Hep C must be erroneous  Recommendations:  1. [place] for dischg from GI standpoint 2. Ok to d/c sucralfate--I don't think that is needed as outpt 3. Ok to reduce PPI to once-daily dosing for outpt treatment (any brand---generic OTC omeprazole may be cheapest for this pt w/ no insurance) 4. Have made f/u appt's for pt to see our Physician Assistant on Aug 14 at 8:45 am, and Dr. Cannon Kettle on Sept 14 at 2pm (pt has been given our card with these appointments). 5.  We will f/u on pt's gastric bx's and arrange treatment for H. Pylori if it is present. 6.  No activity or dietary restrictions needed from GI standpoint.  Will sign off; call us if you have questions or if we can be of further assistance.  Cleotis Nipper, M.D. Pager [phone] If no answer or after 5 PM call [phone]

## 2019-07-20 NOTE — Plan of Care (Signed)
  Problem: Clinical Measurements: Goal: Will remain free from infection Outcome: Progressing   Problem: Clinical Measurements: Goal: Diagnostic test results will improve Outcome: Progressing   Problem: Activity: Goal: Risk for activity intolerance will decrease Outcome: Progressing   Problem: Elimination: Goal: Will not experience complications related to urinary retention Outcome: Progressing   Problem: Elimination: Goal: Will not experience complications related to bowel motility Outcome: Progressing

## 2019-07-20 NOTE — Discharge Instructions (Signed)
Peptic Ulcer  A peptic ulcer is a sore in the lining of the stomach (gastric ulcer) or the first part of the small intestine (duodenal ulcer). The ulcer causes a gradual wearing away (erosion) of the deeper tissue. What are the causes? Normally, the lining of the stomach and the small intestine protects them from the acid that digests food. The protective lining can be damaged by:  An infection caused by a type of bacteria called Helicobacter pylori or H. pylori.  Regular use of NSAIDs, such as ibuprofen or aspirin.  Rare tumors in the stomach, small intestine, or pancreas (Zollinger-Ellison syndrome). What increases the risk? The following factors may make you more likely to develop this condition:  Smoking.  Having a family history of ulcer disease.  Drinking alcohol.  Having been hospitalized in an intensive care unit (ICU). What are the signs or symptoms? Symptoms of this condition include:  Persistent burning pain in the area between the chest and the belly button. The pain may be worse on an empty stomach and at night.  Heartburn.  Nausea and vomiting.  Bloating. If the ulcer results in bleeding, it can cause:  Black, tarry stools.  Vomiting of bright red blood.  Vomiting of material that looks like coffee grounds. How is this diagnosed? This condition may be diagnosed based on:  Your medical history and a physical exam.  Various tests or procedures, such as: ? Blood tests, stool tests, or breath tests to check for the H. pylori bacteria. ? An X-ray exam (upper gastrointestinal series) of the esophagus, stomach, and small intestine. ? Upper endoscopy. The health care provider examines the esophagus, stomach, and small intestine using a small flexible tube that has a video camera at the end. ? Biopsy. A tissue sample is removed to be examined under a microscope. How is this treated? Treatment for this condition may include:  Eliminating the cause of the ulcer,  such as smoking or use of NSAIDs, and limiting alcohol and caffeine intake.  Medicines to reduce the amount of acid in your digestive tract.  Antibiotic medicines, if the ulcer is caused by an H. pylori infection.  An upper endoscopy may be used to treat a bleeding ulcer.  Surgery. This may be needed if the bleeding is severe or if the ulcer created a hole somewhere in the digestive system. Follow these instructions at home:  Do not drink alcohol if your health care provider tells you not to drink.  Do not use any products that contain nicotine or tobacco, such as cigarettes, e-cigarettes, and chewing tobacco. If you need help quitting, ask your health care provider.  Take over-the-counter and prescription medicines only as told by your health care provider. ? Do not use over-the-counter medicines in place of prescription medicines unless your health care provider approves. ? Do not take aspirin, ibuprofen, or other NSAIDs unless your health care provider told you to do so.  Take over-the-counter and prescription medicines only as told by your health care provider.  Keep all follow-up visits as told by your health care provider. This is important. Contact a health care provider if:  Your symptoms do not improve within 7 days of starting treatment.  You have ongoing indigestion or heartburn. Get help right away if:  You have sudden, sharp, or persistent pain in your abdomen.  You have bloody or dark black, tarry stools.  You vomit blood or material that looks like coffee grounds.  You become light-headed or you feel faint.    You become weak.  You become sweaty or clammy. Summary  A peptic ulcer is a sore in the lining of the stomach (gastric ulcer) or the first part of the small intestine (duodenal ulcer). The ulcer causes a gradual wearing away (erosion) of the deeper tissue.  Do not use any products that contain nicotine or tobacco, such as cigarettes, e-cigarettes, and  chewing tobacco. If you need help quitting, ask your health care provider.  Take over-the-counter and prescription medicines only as told by your health care provider. Do not use over-the-counter medicines in place of prescription medicines unless your health care provider approves.  Contact your health care provider if you have ongoing indigestion or heartburn.  Keep all follow-up visits as told by your health care provider. This is important. This information is not intended to replace advice given to you by your health care provider. Make sure you discuss any questions you have with your health care provider. Document Released: 12/03/2000 Document Revised: 06/13/2018 Document Reviewed: 06/13/2018 [employer]vier Patient Education  2020 [employer]  

## 2019-07-20 NOTE — Discharge Summary (Signed)
Physician Discharge Summary  Cathe MonsRobert Leon Horne ZOX:[id number] DOB: 02/25/1976 DOA: 07/17/2019  PCP: Patient, No Pcp Per  Admit date: 07/17/2019 Discharge date: 07/20/2019  Admitted From: Home Disposition:  Home  Recommendations for Outpatient Follow-up:  1. Follow up with PCP in 1 week 2. Follow up with GI as scheduled 8/14 and 9/14 3. Please follow-up on gastric biopsy result 4. Incidental finding of left lower lobe nodule. Need follow-up with outpatient CT chest in 4 to 6 weeks  Discharge Condition: Stable, improved CODE STATUS: Full code Diet recommendation: Regular diet  Brief/Interim Summary: William DerRobert Leon Websteris a 43 y.o.malewith medical history significant forgastritis, reported history of hepatitis C, tobacco use, and marijuana use with suspected history of cannabinoid hyperemesis syndrome who presents to the ED for evaluation of persistent nausea and vomiting. Patient states symptoms began 4 days ago and he has been unable to maintain any adequate oral intake. He reports mostly clear emesis but has seen occasional streaks of blood or brown-colored emesis. He denies any coffee-ground appearance. He has intermittent abdominal pain associated with his vomiting episodes. He reports seeing dark black-colored stool without bright red blood. He reports occasional chills. He denies any subjective fevers, chest pain, dyspnea, or dysuria.  Patient admitted for concern for gastric ulcer and gastritis.  Discharge Diagnoses:  Principal Problem:   Gastric ulcer, unspecified as acute or chronic, without hemorrhage or perforation Active Problems:   Tobacco use   Marijuana use   Nausea and vomiting   Microcytic anemia   Gastric ulcer with acute gastritis -CT abdomen pelvis revealed suspected gastric ulcer with surrounding gastric wall edema along the lesser curvature of the stomach, without free air to suggest perforation -FOBT negative -EGD 7/29 by Dr. Bosie ClosSchooler: Esophagitis,  nonbleeding gastric ulcer, acute gastritis -Awaiting gastric biopsy results -Follow-up with outpatient GI -PPI  Iron deficiency anemia -Ferritin 11 -IV Feraheme given 7/29  Incidental finding of left lower lobe nodule -Need follow-up with outpatient CT chest in 4 to 6 weeks  Tobacco abuse -Nicotine patch, cessation counseling   Discharge Instructions  Discharge Instructions    Call MD for:   Complete by: As directed    Signs of GI bleeding   Call MD for:  difficulty breathing, headache or visual disturbances   Complete by: As directed    Call MD for:  extreme fatigue   Complete by: As directed    Call MD for:  persistant dizziness or light-headedness   Complete by: As directed    Call MD for:  persistant nausea and vomiting   Complete by: As directed    Call MD for:  severe uncontrolled pain   Complete by: As directed    Call MD for:  temperature >100.4   Complete by: As directed    Diet general   Complete by: As directed    Discharge instructions   Complete by: As directed    Establish with primary care physician and follow-up with GI as recommended.  Incidental finding of left lower lobe nodule. Need follow-up with outpatient CT chest in 4 to 6 weeks  You were cared for by a hospitalist during your hospital stay. If you have any questions about your discharge medications or the care you received while you were in the hospital after you are discharged, you can call the unit and ask to speak with the hospitalist on call if the hospitalist that took care of you is not available. Once you are discharged, your primary care physician will handle any further  medical issues. Please note that NO REFILLS for any discharge medications will be authorized once you are discharged, as it is imperative that you return to your primary care physician (or establish a relationship with a primary care physician if you do not have one) for your aftercare needs so that they can reassess your  need for medications and monitor your lab values.   Increase activity slowly   Complete by: As directed      Allergies as of 07/20/2019      Reactions   Asa [aspirin] Nausea And Vomiting      Medication List    STOP taking these medications   bismuth subsalicylate 262 MG/15ML suspension Commonly known as: PEPTO BISMOL   ondansetron 4 MG disintegrating tablet Commonly known as: Zofran ODT   penicillin v potassium 500 MG tablet Commonly known as: VEETID   promethazine 25 MG suppository Commonly known as: PHENERGAN   ranitidine 150 MG tablet Commonly known as: ZANTAC     TAKE these medications   omeprazole 40 MG capsule Commonly known as: PRILOSEC Take 1 capsule (40 mg total) by mouth daily. What changed:   medication strength  how much to take      Follow-up Information    [facility] Follow up.   Why: Call and establish with a primary care physician. Incidental finding of left lower lobe nodule. Need follow-up with outpatient CT chest in 4 to 6 weeks Contact information: [address] [place] [id number] [phone]       [facility], [facility] on 08/03/2019.   Contact information: [address] [place] [id number] [phone]          Allergies  Allergen Reactions  . Asa [Aspirin] Nausea And Vomiting    Consultations:  GI   Procedures/Studies: Ct Abdomen Pelvis W Contrast  Result Date: 07/17/2019 CLINICAL DATA:  Nausea/vomiting, epigastric abdominal pain EXAM: CT ABDOMEN AND PELVIS WITH CONTRAST TECHNIQUE: Multidetector CT imaging of the abdomen and pelvis was performed using the standard protocol following bolus administration of intravenous contrast. CONTRAST:  100mL OMNIPAQUE IOHEXOL 300 MG/ML  SOLN COMPARISON:  None. FINDINGS: Lower chest: 11 x 7 x 14 mm irregular nodule in the left lower lobe (series 4/image 13), indeterminate. Hepatobiliary: Liver is within normal  limits. Gallbladder is unremarkable. No intrahepatic or extrahepatic ductal dilatation. Pancreas: Within normal limits. Spleen: Within normal limits. Adrenals/Urinary Tract: Adrenal glands are within normal limits. Kidneys are within normal limits.  No hydronephrosis. Bladder is within normal limits. Stomach/Bowel: Prominent rugal folds, nonspecific. Mild gastric wall edema along the lesser curvature with a suspected gastric ulcer (series 2/image 26). No associated free air to suggest perforation. No evidence of bowel obstruction. Normal appendix (series 2/image 55). Vascular/Lymphatic: No evidence of abdominal aortic aneurysm. Mild atherosclerotic calcifications the abdominal aorta. No suspicious abdominopelvic lymphadenopathy. Reproductive: Prostate is unremarkable. Other: No abdominopelvic ascites. Musculoskeletal: Degenerative changes at L5-S1. IMPRESSION: Suspected gastric ulcer with surrounding gastric wall edema along the lesser curvature of the stomach. No associated free air to suggest perforation. Consider endoscopy for further evaluation. 14 mm irregular left lower lobe nodule, indeterminate. No prior imaging is available for comparison, and therefore infection/inflammation remains within the differential. Consider short-term follow-up CT chest in 4-6 weeks to assess for persistence. If present at that time, this would be concerning for malignancy, and PET-CT would be the appropriate next step. These results were called by telephone at the time of interpretation on 07/17/2019 at  10:30 pm to [place], PA, who verbally acknowledged these results. Electronically Signed   By: Julian Hy M.D.   On: 07/17/2019 22:32   US Abdomen Limited Ruq  Result Date: 07/16/2019 CLINICAL DATA:  Abdominal pain. EXAM: ULTRASOUND ABDOMEN LIMITED RIGHT UPPER QUADRANT COMPARISON:  Abdominal series 11/20/2011. FINDINGS: Gallbladder: No gallstones or wall thickening visualized. No sonographic Murphy sign noted by  sonographer. Common bile duct: Diameter: 4.4 Liver: No focal lesion identified. Within normal limits in parenchymal echogenicity. Portal vein is patent on color Doppler imaging with normal direction of blood flow towards the liver. IMPRESSION: Negative exam.  No gallstones or biliary distention. Electronically Signed   By: Marcello Moores  Register   On: 07/16/2019 12:40    EGD 7/29 Impression:       - LA Grade C reflux esophagitis.                           - Z-line, 42 cm from the incisors.                           - Non-bleeding gastric ulcer with no stigmata of                            bleeding.                           - Acute gastritis. Biopsied.                           - Non-bleeding gastric ulcers with no stigmata of                            bleeding.                           - Normal examined duodenum.                           - Acute gastritis. Biopsied.    Discharge Exam: Vitals:   07/19/19 2155 07/20/19 0512  BP: 126/85 120/70  Pulse: 70 (!) 57  Resp: 16 14  Temp: 98.7 F (37.1 C) 98.2 F (36.8 C)  SpO2: 100% 100%     General: Pt is alert, awake, not in acute distress Cardiovascular: RRR, S1/S2 +, no rubs, no gallops Respiratory: CTA bilaterally, no wheezing, no rhonchi Abdominal: Soft, NT, ND, bowel sounds + Extremities: no edema, no cyanosis    The results of significant diagnostics from this hospitalization (including imaging, microbiology, ancillary and laboratory) are listed below for reference.     Microbiology: Recent Results (from the past 240 hour(s))  SARS Coronavirus 2 (CEPHEID- Performed in [facility] hospital lab), Hosp Order     Status: None   Collection Time: 07/17/19  9:49 PM   Specimen: Nasopharyngeal Swab  Result Value Ref Range Status   SARS Coronavirus 2 NEGATIVE NEGATIVE Final    Comment: (NOTE) If result is NEGATIVE SARS-CoV-2 target nucleic acids are NOT DETECTED. The SARS-CoV-2 RNA is generally detectable in upper and lower   respiratory specimens during the acute phase of infection. The lowest  concentration of SARS-CoV-2 viral copies this assay can detect is 250  copies /  mL. A negative result does not preclude SARS-CoV-2 infection  and should not be used as the sole basis for treatment or other  patient management decisions.  A negative result may occur with  improper specimen collection / handling, submission of specimen other  than nasopharyngeal swab, presence of viral mutation(s) within the  areas targeted by this assay, and inadequate number of viral copies  (<250 copies / mL). A negative result must be combined with clinical  observations, patient history, and epidemiological information. If result is POSITIVE SARS-CoV-2 target nucleic acids are DETECTED. The SARS-CoV-2 RNA is generally detectable in upper and lower  respiratory specimens dur ing the acute phase of infection.  Positive  results are indicative of active infection with SARS-CoV-2.  Clinical  correlation with patient history and other diagnostic information is  necessary to determine patient infection status.  Positive results do  not rule out bacterial infection or co-infection with other viruses. If result is PRESUMPTIVE POSTIVE SARS-CoV-2 nucleic acids MAY BE PRESENT.   A presumptive positive result was obtained on the submitted specimen  and confirmed on repeat testing.  While 2019 novel coronavirus  (SARS-CoV-2) nucleic acids may be present in the submitted sample  additional confirmatory testing may be necessary for epidemiological  and / or clinical management purposes  to differentiate between  SARS-CoV-2 and other Sarbecovirus currently known to infect humans.  If clinically indicated additional testing with an alternate test  methodology [phone]) is advised. The SARS-CoV-2 RNA is generally  detectable in upper and lower respiratory sp ecimens during the acute  phase of infection. The expected result is Negative. Fact  Sheet for Patients:  BoilerBrush.com.cy Fact Sheet for Healthcare Providers: https://pope.com/ This test is not yet approved or cleared by the [place] FDA and has been authorized for detection and/or diagnosis of SARS-CoV-2 by FDA under an Emergency Use Authorization (EUA).  This EUA will remain in effect (meaning this test can be used) for the duration of the COVID-19 declaration under Section 564(b)(1) of the Act, 21 U.S.C. section 360bbb-3(b)(1), unless the authorization is terminated or revoked sooner. Performed at [facility], [address] [place], [place] [address]      Labs: BNP (last 3 results) No results for input(s): BNP in the last 8760 hours. Basic Metabolic Panel: Recent Labs  Lab 07/16/19 1215 07/17/19 1752 07/18/19 0528 07/19/19 0521  NA 137 137 138 138  K 3.7 3.7 3.7 3.8  CL 99 99 105 106  CO2 25 27 25 26   GLUCOSE 108* 115* 96 86  BUN 30* 26* 23* 14  CREATININE 1.33* 1.26* 1.15 1.08  CALCIUM 9.5 9.3 8.3* 8.4*   Liver Function Tests: Recent Labs  Lab 07/16/19 1215 07/17/19 1752  AST 36 47*  ALT 21 26  ALKPHOS 48 46  BILITOT 1.1 0.9  PROT 8.9* 8.2*  ALBUMIN 4.5 4.3   Recent Labs  Lab 07/16/19 1215 07/17/19 1752  LIPASE 24 28   No results for input(s): AMMONIA in the last 168 hours. CBC: Recent Labs  Lab 07/16/19 1215 07/17/19 1752 07/18/19 0528 07/19/19 0521 07/20/19 0451  WBC 9.1 10.8* 8.5 6.0 5.2  HGB 12.6* 11.9* 9.7* 9.2* 9.4*  HCT 43.4 41.6 33.6* 31.4* 32.3*  MCV 67.9* 69.1* 68.4* 69.3* 69.5*  PLT 420* 406* 341 300 308   Cardiac Enzymes: No results for input(s): CKTOTAL, CKMB, CKMBINDEX, TROPONINI in the last 168 hours. BNP: Invalid input(s): POCBNP CBG: No results for input(s): GLUCAP in the last 168 hours.  D-Dimer No results for input(s): DDIMER in the last 72 hours. Hgb A1c No results for input(s): HGBA1C in the last 72 hours. Lipid Profile No  results for input(s): CHOL, HDL, LDLCALC, TRIG, CHOLHDL, LDLDIRECT in the last 72 hours. Thyroid function studies No results for input(s): TSH, T4TOTAL, T3FREE, THYROIDAB in the last 72 hours.  Invalid input(s): FREET3 Anemia work up Recent Labs    07/18/19 0000  FERRITIN 11*   Urinalysis    Component Value Date/Time   COLORURINE YELLOW 07/17/2019 1948   APPEARANCEUR CLEAR 07/17/2019 1948   LABSPEC >1.046 (H) 07/17/2019 1948   PHURINE 7.0 07/17/2019 1948   GLUCOSEU NEGATIVE 07/17/2019 1948   HGBUR NEGATIVE 07/17/2019 1948   BILIRUBINUR NEGATIVE 07/17/2019 1948   KETONESUR 5 (A) 07/17/2019 1948   PROTEINUR 30 (A) 07/17/2019 1948   UROBILINOGEN 1.0 11/23/2011 2221   NITRITE NEGATIVE 07/17/2019 1948   LEUKOCYTESUR NEGATIVE 07/17/2019 1948   Sepsis Labs Invalid input(s): PROCALCITONIN,  WBC,  LACTICIDVEN Microbiology Recent Results (from the past 240 hour(s))  SARS Coronavirus 2 (CEPHEID- Performed in [facility]), Hosp Order     Status: None   Collection Time: 07/17/19  9:49 PM   Specimen: Nasopharyngeal Swab  Result Value Ref Range Status   SARS Coronavirus 2 NEGATIVE NEGATIVE Final    Comment: (NOTE) If result is NEGATIVE SARS-CoV-2 target nucleic acids are NOT DETECTED. The SARS-CoV-2 RNA is generally detectable in upper and lower  respiratory specimens during the acute phase of infection. The lowest  concentration of SARS-CoV-2 viral copies this assay can detect is 250  copies / mL. A negative result does not preclude SARS-CoV-2 infection  and should not be used as the sole basis for treatment or other  patient management decisions.  A negative result may occur with  improper specimen collection / handling, submission of specimen other  than nasopharyngeal swab, presence of viral mutation(s) within the  areas targeted by this assay, and inadequate number of viral copies  (<250 copies / mL). A negative result must be combined with clinical  observations,  patient history, and epidemiological information. If result is POSITIVE SARS-CoV-2 target nucleic acids are DETECTED. The SARS-CoV-2 RNA is generally detectable in upper and lower  respiratory specimens dur ing the acute phase of infection.  Positive  results are indicative of active infection with SARS-CoV-2.  Clinical  correlation with patient history and other diagnostic information is  necessary to determine patient infection status.  Positive results do  not rule out bacterial infection or co-infection with other viruses. If result is PRESUMPTIVE POSTIVE SARS-CoV-2 nucleic acids MAY BE PRESENT.   A presumptive positive result was obtained on the submitted specimen  and confirmed on repeat testing.  While 2019 novel coronavirus  (SARS-CoV-2) nucleic acids may be present in the submitted sample  additional confirmatory testing may be necessary for epidemiological  and / or clinical management purposes  to differentiate between  SARS-CoV-2 and other Sarbecovirus currently known to infect humans.  If clinically indicated additional testing with an alternate test  methodology [phone] is advised. The SARS-CoV-2 RNA is generally  detectable in upper and lower respiratory sp ecimens during the acute  phase of infection. The expected result is Negative. Fact Sheet for Patients:  BoilerBrush.com.cyhttps://www.fda.gov/media/136312/download Fact Sheet for Healthcare Providers: https://pope.com/https://www.fda.gov/media/136313/download This test is not yet approved or cleared by the [place] FDA and has been authorized for detection and/or diagnosis of SARS-CoV-2 by FDA under an Emergency Use Authorization (EUA).  This EUA will  remain in effect (meaning this test can be used) for the duration of the COVID-19 declaration under Section 564(b)(1) of the Act, 21 U.S.C. section 360bbb-3(b)(1), unless the authorization is terminated or revoked sooner. Performed at [facility], [address], [place], [place] [address]      Patient was seen and examined on the day of discharge and was found to be in stable condition. Time coordinating discharge: 25 minutes including assessment and coordination of care, as well as examination of the patient.   SIGNED:  Noralee Stain, DO [facility] [email or web address] 07/20/2019, 10:35 AM

## 2019-07-26 ENCOUNTER — Other Ambulatory Visit: Payer: Self-pay

## 2019-07-26 ENCOUNTER — Emergency Department (HOSPITAL_COMMUNITY)
Admission: EM | Admit: 2019-07-26 | Discharge: 2019-07-26 | Disposition: A | Discharge status: Home / Self Care | Payer: Self-pay | Attending: Emergency Medicine | Admitting: Emergency Medicine

## 2019-07-26 ENCOUNTER — Encounter (HOSPITAL_COMMUNITY): Payer: Self-pay | Admitting: Emergency Medicine

## 2019-07-26 DIAGNOSIS — R112 Nausea with vomiting, unspecified: Secondary | ICD-10-CM | POA: Insufficient documentation

## 2019-07-26 DIAGNOSIS — F17210 Nicotine dependence, cigarettes, uncomplicated: Secondary | ICD-10-CM | POA: Insufficient documentation

## 2019-07-26 DIAGNOSIS — Z79899 Other long term (current) drug therapy: Secondary | ICD-10-CM | POA: Insufficient documentation

## 2019-07-26 LAB — COMPREHENSIVE METABOLIC PANEL
ALT: 19 U/L (ref 0–44)
AST: 19 U/L (ref 15–41)
Albumin: 4.5 g/dL (ref 3.5–5.0)
Alkaline Phosphatase: 46 U/L (ref 38–126)
Anion gap: 11 (ref 5–15)
BUN: 27 mg/dL — ABNORMAL HIGH (ref 6–20)
CO2: 28 mmol/L (ref 22–32)
Calcium: 9.8 mg/dL (ref 8.9–10.3)
Chloride: 97 mmol/L — ABNORMAL LOW (ref 98–111)
Creatinine, Ser: 1.46 mg/dL — ABNORMAL HIGH (ref 0.61–1.24)
GFR calc Af Amer: >60 mL/min (ref >60)
GFR calc non Af Amer: 58 mL/min — ABNORMAL LOW (ref >60)
Glucose, Bld: 118 mg/dL — ABNORMAL HIGH (ref 70–99)
Potassium: 4 mmol/L (ref 3.5–5.1)
Sodium: 136 mmol/L (ref 135–145)
Total Bilirubin: 0.7 mg/dL (ref 0.3–1.2)
Total Protein: 8.6 g/dL — ABNORMAL HIGH (ref 6.5–8.1)

## 2019-07-26 LAB — URINALYSIS, ROUTINE W REFLEX MICROSCOPIC
Glucose, UA: NEGATIVE mg/dL
Hgb urine dipstick: NEGATIVE
Ketones, ur: 5 mg/dL — AB
Leukocytes,Ua: NEGATIVE
Nitrite: NEGATIVE
Protein, ur: 30 mg/dL — AB
Specific Gravity, Urine: 1.034 — ABNORMAL HIGH (ref 1.005–1.030)
pH: 5 (ref 5.0–8.0)

## 2019-07-26 LAB — CBC
HCT: 38.7 % — ABNORMAL LOW (ref 39.0–52.0)
Hemoglobin: 11.3 g/dL — ABNORMAL LOW (ref 13.0–17.0)
MCH: 20.5 pg — ABNORMAL LOW (ref 26.0–34.0)
MCHC: 29.2 g/dL — ABNORMAL LOW (ref 30.0–36.0)
MCV: 70.2 fL — ABNORMAL LOW (ref 80.0–100.0)
Platelets: 485 10*3/uL — ABNORMAL HIGH (ref 150–400)
RBC: 5.51 MIL/uL (ref 4.22–5.81)
RDW: 23.6 % — ABNORMAL HIGH (ref 11.5–15.5)
WBC: 9.1 10*3/uL (ref 4.0–10.5)
nRBC: 0 % (ref 0.0–0.2)

## 2019-07-26 LAB — LIPASE, BLOOD: Lipase: 25 U/L (ref 11–51)

## 2019-07-26 MED ORDER — ONDANSETRON HCL 4 MG PO TABS: 4.0000 mg | ORAL_TABLET | Freq: Three times a day (TID) | ORAL | 0 refills | Status: DC | PRN

## 2019-07-26 MED ORDER — SODIUM CHLORIDE 0.9% FLUSH: 3.0000 mL | Freq: Once | INTRAVENOUS | Status: DC

## 2019-07-26 MED ORDER — ONDANSETRON 4 MG PO TBDP
4.0000 mg | ORAL_TABLET | Freq: Once | ORAL | Status: AC | PRN
  Administered 2019-07-26: 13:00:00 4 mg via ORAL
  Filled 2019-07-26: qty 1

## 2019-07-26 NOTE — ED Notes (Signed)
This Probation officer gave patient warm blanket w/ heat packs for patient comfort.

## 2019-07-26 NOTE — ED Triage Notes (Signed)
Patient recently admitted for N/V and gastric ulcer on 7/29. Had upper endoscopy. Visitor at bedside reports patient was unable to get meds after discharge and patient has been vomiting blood since 8/1. Denies diarrhea.

## 2019-07-26 NOTE — ED Notes (Signed)
Pt given water for PO challenge 

## 2019-07-26 NOTE — ED Notes (Signed)
Pt tolerating chicken broth at this time. Lovena Le, RN aware.

## 2019-07-26 NOTE — Discharge Instructions (Signed)
Recommend continuing the omeprazole as previously prescribed.  Recommend continuing Zofran as needed for further episodes of nausea.  If you feel that your vomiting comes back, develop blood in vomit, please return here for reassessment.  Please call your GI doctor to schedule a close follow-up appointment regarding symptoms you are having today.

## 2019-07-26 NOTE — ED Provider Notes (Signed)
[facility] DEPT Provider Note   CSN: [id number] Arrival date & time: 07/26/19  1209     History   Chief Complaint Chief Complaint  Patient presents with  . Emesis    HPI William Horne is a 43 y.o. male.  Presents emerged from with chief complaint vomiting.  Patient recently admitted for hematemesis.  Scope showed gastritis, nonbleeding ulcer.  Started on PPI and discharged home.  Patient states last night was having severe nausea and vomiting and noted some blood in his vomit at that time.  Today continued to have nausea and had a couple more episodes of emesis, nonbloody.  Since receiving Zofran in our emergency department patient had not had any additional vomiting and states his symptoms feel improved.  Does report compliance with new PPI.  Denies any associated abdominal pain.    HPI  Past Medical History:  Diagnosis Date  . Dental caries   . Eczema   . Gastritis   . Hepatitis C   . Shoulder dislocation     Patient Active Problem List   Diagnosis Date Noted  . Gastric ulcer, unspecified as acute or chronic, without hemorrhage or perforation 07/17/2019  . Tobacco use 07/17/2019  . Marijuana use 07/17/2019  . Nausea and vomiting 07/17/2019  . Microcytic anemia 07/17/2019    Past Surgical History:  Procedure Laterality Date  . BIOPSY  07/18/2019   Procedure: BIOPSY;  Surgeon: Wilford Corner, MD;  Location: [facility];  Service: Endoscopy;;  . ESOPHAGOGASTRODUODENOSCOPY (EGD) WITH PROPOFOL N/A 07/18/2019   Procedure: ESOPHAGOGASTRODUODENOSCOPY (EGD) WITH PROPOFOL;  Surgeon: Wilford Corner, MD;  Location: [facility];  Service: Endoscopy;  Laterality: N/A;        Home Medications    Prior to Admission medications   Medication Sig Start Date End Date Taking? Authorizing Provider  omeprazole (PRILOSEC) 40 MG capsule Take 1 capsule (40 mg total) by mouth daily. 07/20/19   Dessa Phi, DO    Family History Family  History  Problem Relation Age of Onset  . Rheum arthritis Mother   . Osteoarthritis Mother   . Hypertension Father   . Migraines Sister     Social History Social History   Tobacco Use  . Smoking status: Current Every Day Smoker    Packs/day: 3.00    Years: 15.00    Pack years: 45.00    Types: Cigarettes  . Smokeless tobacco: Former Systems developer    Quit date: 11/22/2009  Substance Use Topics  . Alcohol use: Yes    Comment: a beer a month ago  . Drug use: Yes    Types: Marijuana     Allergies   Asa [aspirin]   Review of Systems Review of Systems  Constitutional: Negative for chills and fever.  HENT: Negative for ear pain and sore throat.   Eyes: Negative for pain and visual disturbance.  Respiratory: Negative for cough and shortness of breath.   Cardiovascular: Negative for chest pain and palpitations.  Gastrointestinal: Positive for vomiting. Negative for abdominal pain.  Genitourinary: Negative for dysuria and hematuria.  Musculoskeletal: Negative for arthralgias and back pain.  Skin: Negative for color change and rash.  Neurological: Negative for seizures and syncope.  All other systems reviewed and are negative.    Physical Exam Updated Vital Signs BP (!) 128/95 (BP Location: Right Arm)   Pulse 87   Temp 98.3 F (36.8 C) (Oral)   Resp 16   SpO2 99%   Physical Exam Vitals signs and nursing  note reviewed.  Constitutional:      Appearance: He is well-developed.  HENT:     Head: Normocephalic and atraumatic.  Eyes:     Conjunctiva/sclera: Conjunctivae normal.  Neck:     Musculoskeletal: Neck supple.  Cardiovascular:     Rate and Rhythm: Normal rate and regular rhythm.     Heart sounds: No murmur.  Pulmonary:     Effort: Pulmonary effort is normal. No respiratory distress.     Breath sounds: Normal breath sounds.  Abdominal:     Palpations: Abdomen is soft.     Tenderness: There is no abdominal tenderness.  Skin:    General: Skin is warm and dry.   Neurological:     Mental Status: He is alert.      ED Treatments / Results  Labs (all labs ordered are listed, but only abnormal results are displayed) Labs Reviewed  COMPREHENSIVE METABOLIC PANEL - Abnormal; Notable for the following components:      Result Value   Chloride 97 (*)    Glucose, Bld 118 (*)    BUN 27 (*)    Creatinine, Ser 1.46 (*)    Total Protein 8.6 (*)    GFR calc non Af Amer 58 (*)    All other components within normal limits  CBC - Abnormal; Notable for the following components:   Hemoglobin 11.3 (*)    HCT 38.7 (*)    MCV 70.2 (*)    MCH 20.5 (*)    MCHC 29.2 (*)    RDW 23.6 (*)    Platelets 485 (*)    All other components within normal limits  LIPASE, BLOOD  URINALYSIS, ROUTINE W REFLEX MICROSCOPIC    EKG None  Radiology No results found.  Procedures Procedures (including critical care time)  Medications Ordered in ED Medications  sodium chloride flush (NS) 0.9 % injection 3 mL (has no administration in time range)  ondansetron (ZOFRAN-ODT) disintegrating tablet 4 mg (4 mg Oral Given 07/26/19 1250)     Initial Impression / Assessment and Plan / ED Course  I have reviewed the triage vital signs and the nursing notes.  Pertinent labs & imaging results that were available during my care of the patient were reviewed by me and considered in my medical decision making (see chart for details).       43 year old gentleman recently diagnosed with gastric ulcer presenting to the ER with recurrent nausea, vomiting.  Hemoglobin stable, vital stable, no additional episodes of vomiting after receiving Zofran x1 while in the department.  Provided patient with prescription for Zofran recommended continuing omeprazole and follow-up with GI doctor as outpatient.  Final Clinical Impressions(s) / ED Diagnoses   Final diagnoses:  Non-intractable vomiting with nausea, unspecified vomiting type    ED Discharge Orders         Ordered    ondansetron  (ZOFRAN) 4 MG tablet  Every 8 hours PRN,   Status:  Discontinued     07/26/19 1918    ondansetron (ZOFRAN) 4 MG tablet  Every 8 hours PRN     07/26/19 2000           Milagros Lollykstra, Kohl Polinsky S, MD 07/26/19 2336

## 2019-07-26 NOTE — ED Notes (Signed)
Pt asked how he felt after drinking water for his PO challenge. Pt denied having nausea, but pt requested chicken broth to see "if anything changes". Pt knows to call nursing staff if anything changes.

## 2020-03-19 IMAGING — CT CT ABDOMEN AND PELVIS WITH CONTRAST
2 of 5 series · 15 of 46 positions shown, 17 images · IV contrast (omnipaque)
Comparison: None.

CLINICAL DATA: Nausea/vomiting, epigastric abdominal pain

EXAM:
CT ABDOMEN AND PELVIS WITH CONTRAST
TECHNIQUE: Multidetector CT imaging of the abdomen and pelvis was performed
using the standard protocol following bolus administration of
intravenous contrast.
CONTRAST:  100mL OMNIPAQUE IOHEXOL 300 MG/ML  SOLN

[Series 2: axial st · axial · 0.83mm/px · z∈[+1065,+1480]mm · 12 of 97 slices shown, 14 images]
[im 7/97  soft-tissue]
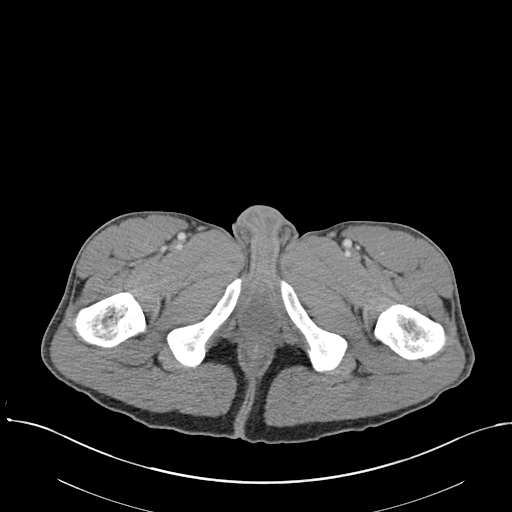
[im 7/97  bone]
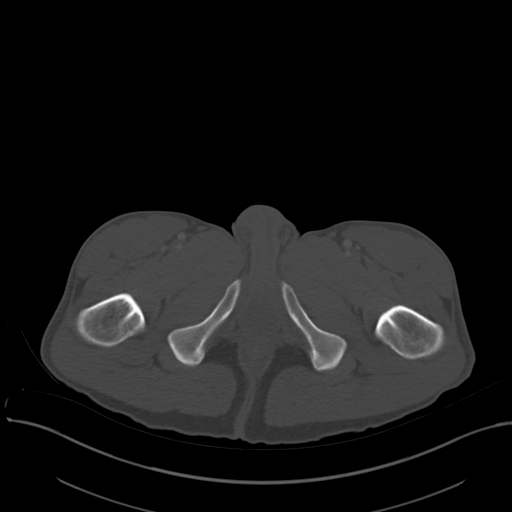
[im 13/97  soft-tissue]
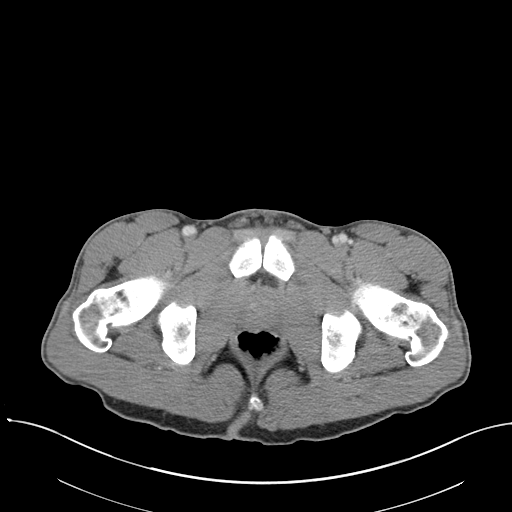
[im 20/97  soft-tissue]
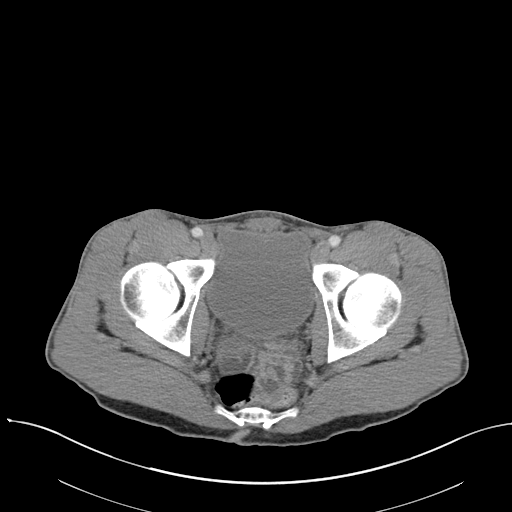
[im 33/97  soft-tissue]
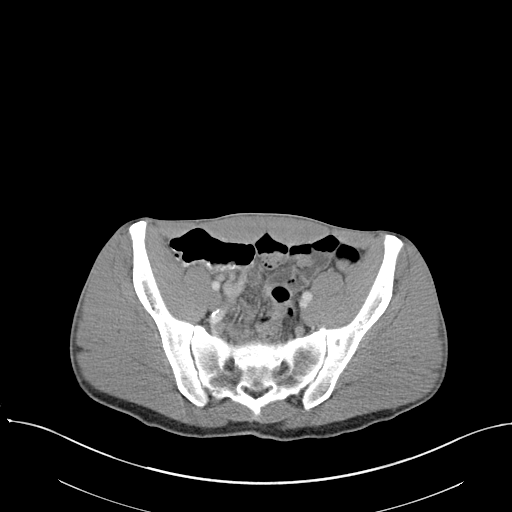
[im 39/97  soft-tissue]
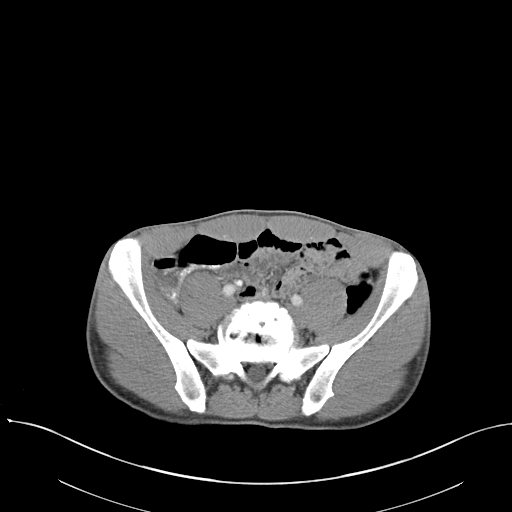
[im 45/97  soft-tissue]
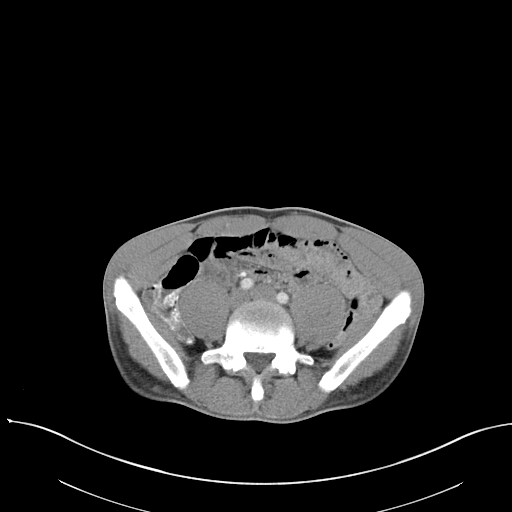
[im 52/97  soft-tissue]
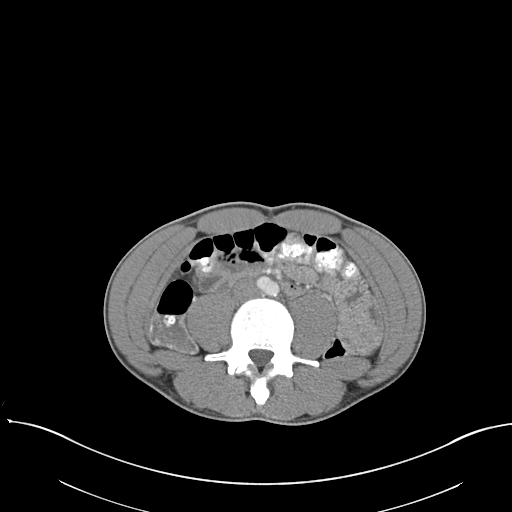
[im 58/97  soft-tissue]
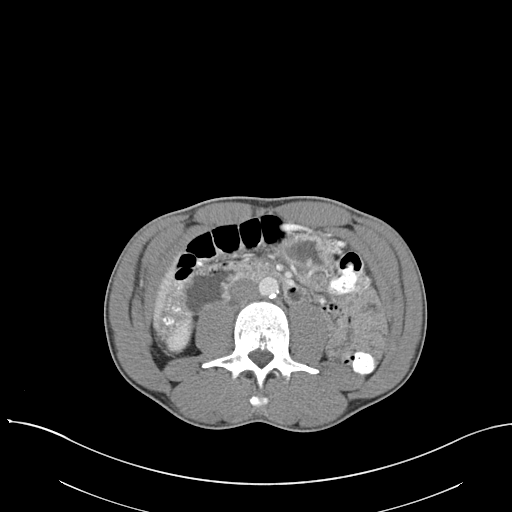
[im 65/97  soft-tissue]
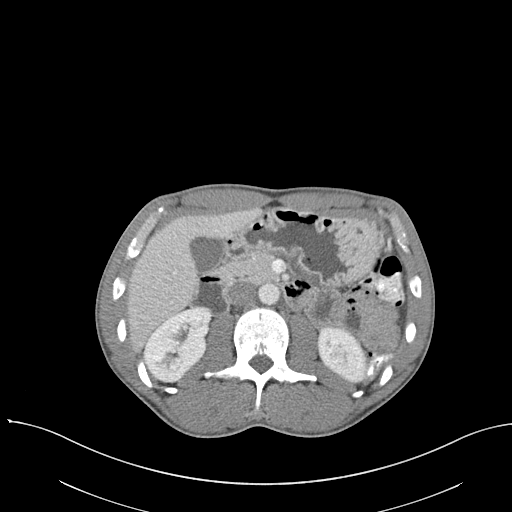
[im 65/97  bone]
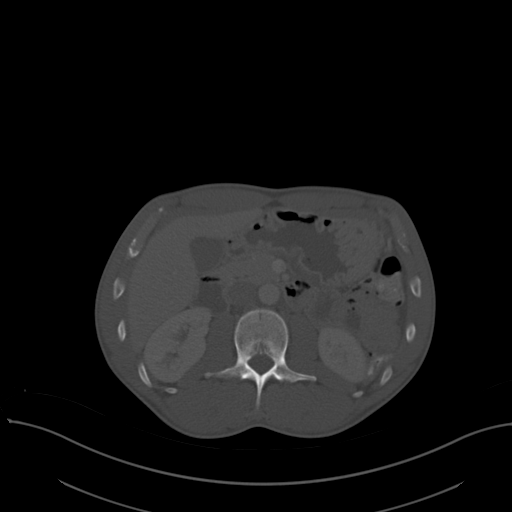
[im 77/97  soft-tissue]
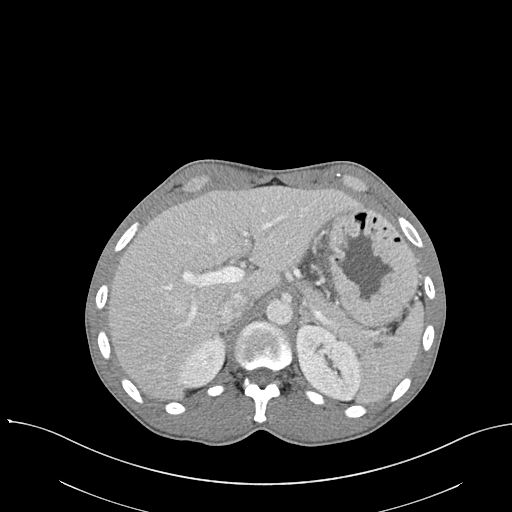
[im 84/97  soft-tissue]
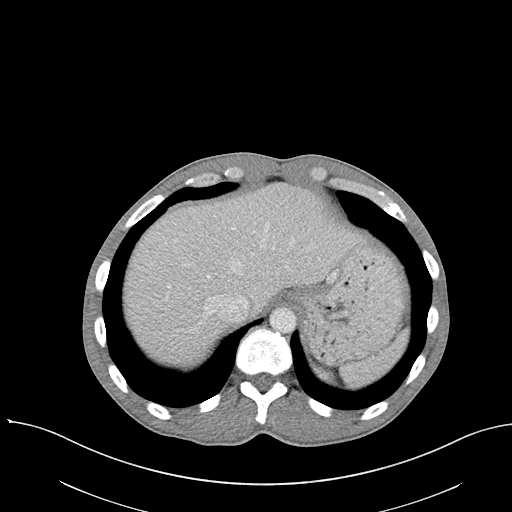
[im 90/97  soft-tissue]
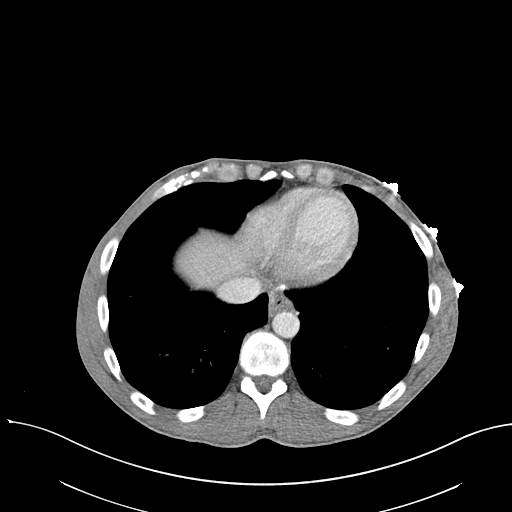

[Series 5: coronal st · coronal · 0.64mm/px · 3 of 124 slices shown]
[im 42/124  soft-tissue]
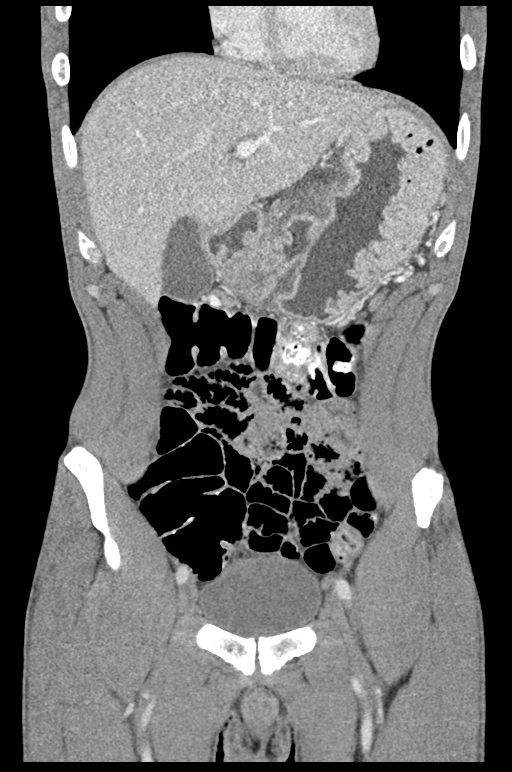
[im 55/124  soft-tissue]
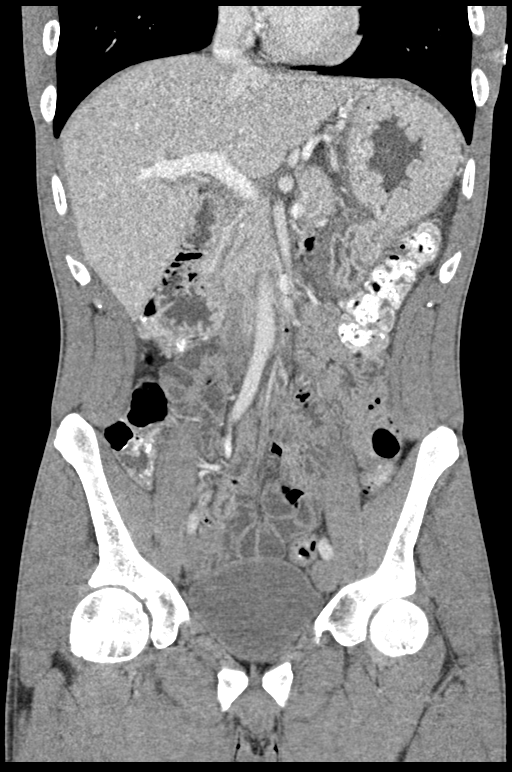
[im 69/124  soft-tissue]
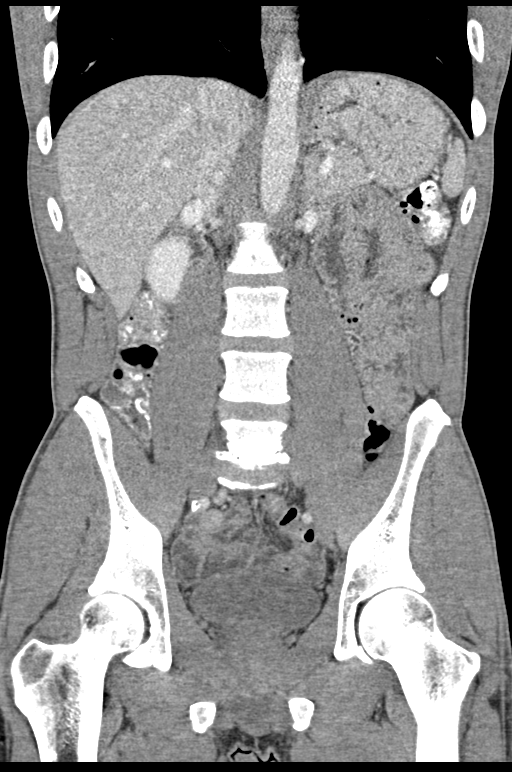

[15 of 46 positions shown; findings below may reference images not displayed]

FINDINGS: Lower chest: 11 x 7 x 14 mm irregular nodule in the left lower lobe
(series 4/image 13), indeterminate.

Hepatobiliary: Liver is within normal limits.

Gallbladder is unremarkable. No intrahepatic or extrahepatic ductal
dilatation.

Pancreas: Within normal limits.

Spleen: Within normal limits.

Adrenals/Urinary Tract: Adrenal glands are within normal limits.

Kidneys are within normal limits.  No hydronephrosis.

Bladder is within normal limits.

Stomach/Bowel: Prominent rugal folds, nonspecific. Mild gastric wall
edema along the lesser curvature with a suspected gastric ulcer
(series 2/image 26). No associated free air to suggest perforation.

No evidence of bowel obstruction.

Normal appendix (series 2/image 55).

Vascular/Lymphatic: No evidence of abdominal aortic aneurysm.

Mild atherosclerotic calcifications the abdominal aorta.

No suspicious abdominopelvic lymphadenopathy.

Reproductive: Prostate is unremarkable.

Other: No abdominopelvic ascites.

Musculoskeletal: Degenerative changes at L5-S1.
IMPRESSION: Suspected gastric ulcer with surrounding gastric wall edema along
the lesser curvature of the stomach. No associated free air to
suggest perforation. Consider endoscopy for further evaluation.

14 mm irregular left lower lobe nodule, indeterminate. No prior
imaging is available for comparison, and therefore
infection/inflammation remains within the differential. Consider
short-term follow-up CT chest in 4-6 weeks to assess for
persistence. If present at that time, this would be concerning for
malignancy, and PET-CT would be the appropriate next step.

These results were called by telephone at the time of interpretation
on 07/17/2019 at [DATE] to QUIRIJN AMAZIGH, PA, who verbally
acknowledged these results.

## 2020-06-08 ENCOUNTER — Other Ambulatory Visit: Payer: Self-pay

## 2020-06-08 ENCOUNTER — Encounter (HOSPITAL_COMMUNITY): Payer: Self-pay | Admitting: Emergency Medicine

## 2020-06-08 DIAGNOSIS — F17210 Nicotine dependence, cigarettes, uncomplicated: Secondary | ICD-10-CM | POA: Insufficient documentation

## 2020-06-08 DIAGNOSIS — Z7983 Long term (current) use of bisphosphonates: Secondary | ICD-10-CM | POA: Insufficient documentation

## 2020-06-08 DIAGNOSIS — R39198 Other difficulties with micturition: Secondary | ICD-10-CM | POA: Insufficient documentation

## 2020-06-08 DIAGNOSIS — M7918 Myalgia, other site: Secondary | ICD-10-CM | POA: Insufficient documentation

## 2020-06-08 DIAGNOSIS — R109 Unspecified abdominal pain: Secondary | ICD-10-CM | POA: Insufficient documentation

## 2020-06-08 DIAGNOSIS — R112 Nausea with vomiting, unspecified: Secondary | ICD-10-CM | POA: Insufficient documentation

## 2020-06-08 LAB — CBC
HCT: 38.9 % — ABNORMAL LOW (ref 39.0–52.0)
Hemoglobin: 12.4 g/dL — ABNORMAL LOW (ref 13.0–17.0)
MCH: 23.9 pg — ABNORMAL LOW (ref 26.0–34.0)
MCHC: 31.9 g/dL (ref 30.0–36.0)
MCV: 75 fL — ABNORMAL LOW (ref 80.0–100.0)
Platelets: 388 10*3/uL (ref 150–400)
RBC: 5.19 MIL/uL (ref 4.22–5.81)
RDW: 16.8 % — ABNORMAL HIGH (ref 11.5–15.5)
WBC: 6.6 10*3/uL (ref 4.0–10.5)
nRBC: 0 % (ref 0.0–0.2)

## 2020-06-08 MED ORDER — ONDANSETRON HCL 4 MG/2ML IJ SOLN: 4.0000 mg | Freq: Once | INTRAMUSCULAR | Status: DC | PRN

## 2020-06-08 MED ORDER — ONDANSETRON 4 MG PO TBDP
4.0000 mg | ORAL_TABLET | Freq: Once | ORAL | Status: AC | PRN
  Administered 2020-06-08: 4 mg via ORAL
  Filled 2020-06-08: qty 1

## 2020-06-08 MED ORDER — SODIUM CHLORIDE 0.9% FLUSH
3.0000 mL | Freq: Once | INTRAVENOUS | Status: AC
  Administered 2020-06-09: 3 mL via INTRAVENOUS

## 2020-06-08 NOTE — ED Triage Notes (Signed)
Patient comes from home by [facility] complaining of abdominal because he has not taken his prilosec. He has not had his medication because he been out of it. Patient has not had anything for the last day.

## 2020-06-09 ENCOUNTER — Emergency Department (HOSPITAL_COMMUNITY)
Admission: EM | Admit: 2020-06-09 | Discharge: 2020-06-09 | Disposition: A | Discharge status: Home / Self Care | Payer: Self-pay | Attending: Emergency Medicine | Admitting: Emergency Medicine

## 2020-06-09 DIAGNOSIS — R1013 Epigastric pain: Secondary | ICD-10-CM

## 2020-06-09 DIAGNOSIS — E860 Dehydration: Secondary | ICD-10-CM

## 2020-06-09 LAB — URINALYSIS, ROUTINE W REFLEX MICROSCOPIC
Bilirubin Urine: NEGATIVE
Glucose, UA: NEGATIVE mg/dL
Hgb urine dipstick: NEGATIVE
Ketones, ur: 20 mg/dL — AB
Leukocytes,Ua: NEGATIVE
Nitrite: NEGATIVE
Protein, ur: 100 mg/dL — AB
Specific Gravity, Urine: 1.03 (ref 1.005–1.030)
pH: 7 (ref 5.0–8.0)

## 2020-06-09 LAB — MAGNESIUM: Magnesium: 2 mg/dL (ref 1.7–2.4)

## 2020-06-09 LAB — COMPREHENSIVE METABOLIC PANEL
ALT: 23 U/L (ref 0–44)
AST: 35 U/L (ref 15–41)
Albumin: 4.6 g/dL (ref 3.5–5.0)
Alkaline Phosphatase: 50 U/L (ref 38–126)
Anion gap: 13 (ref 5–15)
BUN: 17 mg/dL (ref 6–20)
CO2: 28 mmol/L (ref 22–32)
Calcium: 9.3 mg/dL (ref 8.9–10.3)
Chloride: 95 mmol/L — ABNORMAL LOW (ref 98–111)
Creatinine, Ser: 1.07 mg/dL (ref 0.61–1.24)
GFR calc Af Amer: >60 mL/min (ref >60)
GFR calc non Af Amer: >60 mL/min (ref >60)
Glucose, Bld: 112 mg/dL — ABNORMAL HIGH (ref 70–99)
Potassium: 3.1 mmol/L — ABNORMAL LOW (ref 3.5–5.1)
Sodium: 136 mmol/L (ref 135–145)
Total Bilirubin: 1.1 mg/dL (ref 0.3–1.2)
Total Protein: 8.5 g/dL — ABNORMAL HIGH (ref 6.5–8.1)

## 2020-06-09 LAB — CK: Total CK: 802 U/L — ABNORMAL HIGH (ref 49–397)

## 2020-06-09 LAB — LIPASE, BLOOD: Lipase: 24 U/L (ref 11–51)

## 2020-06-09 MED ORDER — PROMETHAZINE HCL 25 MG/ML IJ SOLN
25.0000 mg | Freq: Once | INTRAMUSCULAR | Status: AC
  Administered 2020-06-09: 25 mg via INTRAVENOUS
  Filled 2020-06-09: qty 1

## 2020-06-09 MED ORDER — SODIUM CHLORIDE 0.9 % IV BOLUS
1000.0000 mL | Freq: Once | INTRAVENOUS | Status: AC
  Administered 2020-06-09: 1000 mL via INTRAVENOUS

## 2020-06-09 MED ORDER — POTASSIUM CHLORIDE CRYS ER 20 MEQ PO TBCR
40.0000 meq | EXTENDED_RELEASE_TABLET | Freq: Once | ORAL | Status: AC
  Administered 2020-06-09: 40 meq via ORAL
  Filled 2020-06-09: qty 2

## 2020-06-09 MED ORDER — FAMOTIDINE IN NACL 20-0.9 MG/50ML-% IV SOLN
20.0000 mg | Freq: Once | INTRAVENOUS | Status: AC
  Administered 2020-06-09: 20 mg via INTRAVENOUS
  Filled 2020-06-09: qty 50

## 2020-06-09 MED ORDER — POTASSIUM CHLORIDE CRYS ER 20 MEQ PO TBCR
20.0000 meq | EXTENDED_RELEASE_TABLET | Freq: Every day | ORAL | 0 refills | Status: DC
Start: 2020-06-09 — End: 2020-11-02

## 2020-06-09 MED ORDER — ONDANSETRON 4 MG PO TBDP
4.0000 mg | ORAL_TABLET | Freq: Three times a day (TID) | ORAL | 0 refills | Status: DC | PRN
Start: 2020-06-09 — End: 2020-11-02

## 2020-06-09 MED ORDER — OMEPRAZOLE 40 MG PO CPDR
40.0000 mg | DELAYED_RELEASE_CAPSULE | Freq: Every day | ORAL | 1 refills | Status: DC
Start: 2020-06-09 — End: 2020-11-02

## 2020-06-09 NOTE — ED Provider Notes (Signed)
[facility]-EMERGENCY DEPT Provider Note   CSN: [id number] Arrival date & time: 06/08/20  2240     History Chief Complaint  Patient presents with   Abdominal Pain    William Horne is a 44 y.o. male with history of gastric ulcer, cannabis use disorder, tobacco use disorder, hepatitis C who presents to the emergency department by EMS with a chief complaint of muscular cramping.  The patient reports that he has been having severe cramps in his thighs, calves, and abdominal wall for the last 2 days.  He also notes that he has had 2 days of greater than 20 episodes of vomiting.  He has noticed some streaks of blood in his emesis over the last day.  No melena or hematochezia.  Reports that he has not even been able to keep down water or any other food or fluids.  He is concerned that his "ulcer" is acting up because he has not taken his home Prilosec.  He also notes that he is not produced any urine for 48 hours.  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  No other new activities.  He has not been evaluated by gastroenterology since he underwent endoscopy during an admission in July 2020.  Reports that he has not smoked cannabis and a couple of days.  No recent alcohol or NSAID use.  The history is provided by the patient. No language interpreter was used.       Past Medical History:  Diagnosis Date   Dental caries    Eczema    Gastritis    Hepatitis C    Shoulder dislocation     Patient Active Problem List   Diagnosis Date Noted   Gastric ulcer, unspecified as acute or chronic, without hemorrhage or perforation 07/17/2019   Tobacco use 07/17/2019   Marijuana use 07/17/2019   Nausea and vomiting 07/17/2019   Microcytic anemia 07/17/2019    Past Surgical History:  Procedure Laterality Date   BIOPSY   07/18/2019   Procedure: BIOPSY;  Surgeon: Charlott Rakes, MD;  Location: [facility];  Service: Endoscopy;;   ESOPHAGOGASTRODUODENOSCOPY (EGD) WITH PROPOFOL N/A 07/18/2019   Procedure: ESOPHAGOGASTRODUODENOSCOPY (EGD) WITH PROPOFOL;  Surgeon: Charlott Rakes, MD;  Location: [facility];  Service: Endoscopy;  Laterality: N/A;       Family History  Problem Relation Age of Onset   Rheum arthritis Mother    Osteoarthritis Mother    Hypertension Father    Migraines Sister     Social History   Tobacco Use   Smoking status: Current Every Day Smoker    Packs/day: 3.00    Years: 15.00    Pack years: 45.00    Types: Cigarettes   Smokeless tobacco: Former Neurosurgeon    Quit date: 11/22/2009  Substance Use Topics   Alcohol use: Yes    Comment: a beer a month ago   Drug use: Yes    Types: Marijuana    Home Medications Prior to Admission medications   Medication Sig Start Date End Date Taking? Authorizing Provider  omeprazole (PRILOSEC) 40 MG capsule Take 1 capsule (40 mg total) by mouth daily. 07/20/19   Noralee Stain, DO  ondansetron (ZOFRAN) 4 MG tablet Take 1 tablet (4 mg total) by mouth every 8 (eight) hours as needed for nausea or vomiting. 07/26/19   Milagros Loll, MD  Allergies    Asa [aspirin]  Review of Systems   Review of Systems  Constitutional: Negative for appetite change, chills, diaphoresis and fever.  Respiratory: Negative for shortness of breath and wheezing.   Cardiovascular: Negative for chest pain and palpitations.  Gastrointestinal: Positive for abdominal pain, nausea and vomiting. Negative for blood in stool and diarrhea.  Genitourinary: Positive for difficulty urinating. Negative for dysuria, penile pain, penile swelling, scrotal swelling and testicular pain.  Musculoskeletal: Negative for back pain, myalgias and neck pain.  Skin: Negative for rash.  Allergic/Immunologic: Negative for immunocompromised state.  Neurological: Negative for  dizziness, seizures, syncope, weakness, numbness and headaches.  Psychiatric/Behavioral: Negative for confusion.   Physical Exam Updated Vital Signs BP (!) 150/90    Pulse (!) 57    Temp 99 F (37.2 C) (Oral)    Resp 18    Ht 6\' 4"  (1.93 m)    Wt 83.9 kg    SpO2 100%    BMI 22.52 kg/m   Physical Exam Vitals and nursing note reviewed.  Constitutional:      Appearance: He is well-developed.  HENT:     Head: Normocephalic.     Mouth/Throat:     Mouth: Mucous membranes are moist.  Eyes:     Conjunctiva/sclera: Conjunctivae normal.  Cardiovascular:     Rate and Rhythm: Normal rate and regular rhythm.     Pulses: Normal pulses.     Heart sounds: Normal heart sounds. No murmur heard.  No friction rub. No gallop.   Pulmonary:     Effort: Pulmonary effort is normal. No respiratory distress.     Breath sounds: No stridor. No wheezing, rhonchi or rales.  Chest:     Chest wall: No tenderness.  Abdominal:     General: There is no distension.     Palpations: Abdomen is soft.     Comments: Muscular hypertrophy of the abdominal wall.  No abdominal distention.  He has some mild tenderness palpation in the epigastric region without rebound or guarding.  He is also tender to palpation in the bilateral lower abdomen without peritoneal signs.  No CVA tenderness bilaterally.  No tenderness over McBurney's point.  No palpable masses organomegaly.  Musculoskeletal:        General: Tenderness present.     Cervical back: Neck supple.     Right lower leg: No edema.     Left lower leg: No edema.     Comments: Mild tenderness to palpation to the bilateral calves and thighs.  No tenderness to the joints of the bilateral lower extremities.  Neurovascular intact throughout.  No palpable fasciculations or spasming.  Skin:    General: Skin is warm and dry.     Capillary Refill: Capillary refill takes 2 to 3 seconds.  Neurological:     Mental Status: He is alert.  Psychiatric:        Behavior: Behavior  normal.     ED Results / Procedures / Treatments   Labs (all labs ordered are listed, but only abnormal results are displayed) Labs Reviewed  COMPREHENSIVE METABOLIC PANEL - Abnormal; Notable for the following components:      Result Value   Potassium 3.1 (*)    Chloride 95 (*)    Glucose, Bld 112 (*)    Total Protein 8.5 (*)    All other components within normal limits  CBC - Abnormal; Notable for the following components:   Hemoglobin 12.4 (*)    HCT 38.9 (*)  MCV 75.0 (*)    MCH 23.9 (*)    RDW 16.8 (*)    All other components within normal limits  URINALYSIS, ROUTINE W REFLEX MICROSCOPIC - Abnormal; Notable for the following components:   Ketones, ur 20 (*)    Protein, ur 100 (*)    Bacteria, UA RARE (*)    All other components within normal limits  CK - Abnormal; Notable for the following components:   Total CK 802 (*)    All other components within normal limits  LIPASE, BLOOD  MAGNESIUM    EKG None  Radiology No results found.  Procedures Procedures (including critical care time)  Medications Ordered in ED Medications  ondansetron (ZOFRAN) injection 4 mg (has no administration in time range)  sodium chloride flush (NS) 0.9 % injection 3 mL (3 mLs Intravenous Given 06/09/20 0629)  ondansetron (ZOFRAN-ODT) disintegrating tablet 4 mg (4 mg Oral Given 06/08/20 2314)  sodium chloride 0.9 % bolus 1,000 mL (0 mLs Intravenous Stopped 06/09/20 0701)  famotidine (PEPCID) IVPB 20 mg premix (0 mg Intravenous Stopped 06/09/20 0700)  promethazine (PHENERGAN) injection 25 mg (25 mg Intravenous Given 06/09/20 0628)  sodium chloride 0.9 % bolus 1,000 mL (1,000 mLs Intravenous New Bag/Given 06/09/20 0701)    ED Course  I have reviewed the triage vital signs and the nursing notes.  Pertinent labs & imaging results that were available during my care of the patient were reviewed by me and considered in my medical decision making (see chart for details).    MDM  Rules/Calculators/A&P                          44 year old male with history of gastric ulcer, cannabis use disorder, tobacco use disorder, hepatitis C who presents to the emergency department with abdominal pain, nausea, vomiting, muscular cramps, and no urine output for 48 hours.  Vital signs are normal on arrival to the ER.  Bladder scan attempted, but today's is malfunctioning at this time.  Urine sample obtained by in and out catheterization.   He has abdominal tenderness, but no peritoneal signs.  His primary complaint is vomiting and muscular cramping.  CK level is elevated at 802, which I suspect may be secondary to dehydration from vomiting for the last 2 days.  He has had some bloody streaks in his vomit, likely Mallory-Weiss tear.  He has had no melena or hematochezia.  He has been avoiding alcohol and NSAIDs.  Hemoglobin is stable from previous.  He is mildly hypokalemic, which can be treated with outpatient oral replenishment.  Magnesium is normal and there are no other electrolyte derangements.  UA is not concerning for infection and has mild ketonuria, likely secondary to nausea and vomiting.  Labs are otherwise reassuring.  We will treat with Pepcid, Reglan for nausea and vomiting, and IV fluids.  On reevaluation, patient is now eating graham crackers and drinking fluids without vomiting.  He appears much improved.  He is continuing to endorse muscle cramps. Patient has yet to void independently in the emergency department.  He is on a second IV fluid bolus.  I have a low suspicion for GI bleed, appendicitis, pancreatitis, cholecystitis, diverticulitis, testicular torsion.  If patient improves then repeat imaging is not indicated at this time.  Patient care transferred to PA [place] at the end of my shift to follow follow-up on if patient is able to urinate independently.  If not, he will need a Foley catheter.  He will also need outpatient GI follow-up.  Patient presentation, ED  course, and plan of care discussed with review of all pertinent labs and imaging. Please see his/her note for further details regarding further ED course and disposition.   Final Clinical Impression(s) / ED Diagnoses Final diagnoses:  None    Rx / DC Orders ED Discharge Orders    None       Barkley Boards, PA-C 06/09/20 0921    Molpus, Jonny Ruiz, MD 06/09/20 2241

## 2020-06-09 NOTE — ED Notes (Signed)
Discharge paperwork and prescriptions reviewed with pt.  Pt with no questions or concerns at this time.  

## 2020-06-09 NOTE — ED Notes (Signed)
Pt provided with ginger ale and graham crackers for PO challenge. Will continue to monitor.

## 2020-06-09 NOTE — ED Notes (Signed)
Bladder scan sonogram machine is not functioning properly.

## 2020-06-09 NOTE — ED Notes (Signed)
Pt provided with a urinal and instructed to produce a specimen when he is capable.

## 2020-06-09 NOTE — ED Provider Notes (Signed)
Accepted handoff at shift change from [facility]. Please see prior provider note for more detail.   Briefly: Patient is 44 y.o. "William Horne is a 44 y.o. male with history of gastric ulcer, cannabis use disorder, tobacco use disorder, hepatitis C who presents to the emergency department by EMS with a chief complaint of muscular cramping.  The patient reports that he has been having severe cramps in his thighs, calves, and abdominal wall for the last 2 days.  He also notes that he has had 2 days of greater than 20 episodes of vomiting.  He has noticed some streaks of blood in his emesis over the last day.  No melena or hematochezia.  Reports that he has not even been able to keep down water or any other food or fluids.  He is concerned that his "ulcer" is acting up because he has not taken his home Prilosec.  He also notes that he is not produced any urine for 48 hours.  No fevers, chills, diarrhea, melena, hematochezia, chest pain, shortness of breath, hematuria, dysuria  He has not been evaluated by gastroenterology since he underwent endoscopy during an admission in July 2020.  The history is provided by the patient. No language interpreter was used."  Plan: Plan is to reevaluate patient after is given fluids, Pepcid and again.  Likely discharge with home medications.       Physical Exam  BP 125/87   Pulse 82   Temp 99 F (37.2 C) (Oral)   Resp 16   Ht 6\' 4"  (1.93 m)   Wt 83.9 kg   SpO2 100%   BMI 22.52 kg/m   CONSTITUTIONAL:  well-appearing, NAD NEURO:  Alert and oriented x 3, no focal deficits EYES:  pupils equal and reactive ENT/NECK:  trachea midline, no JVD CARDIO:  reg rate, reg rhythm, well-perfused PULM:  None labored breathing GI/GU:  Abdomen non-distended MSK/SPINE:  No gross deformities, no edema SKIN:  no rash obvious, atraumatic, no ecchymosis  PSYCH:  Appropriate speech and behavior   ED Course/Procedures     Procedures  Results for  orders placed or performed during the hospital encounter of 06/09/20  Lipase, blood  Result Value Ref Range   Lipase 24 11 - 51 U/L  Comprehensive metabolic panel  Result Value Ref Range   Sodium 136 135 - 145 mmol/L   Potassium 3.1 (L) 3.5 - 5.1 mmol/L   Chloride 95 (L) 98 - 111 mmol/L   CO2 28 22 - 32 mmol/L   Glucose, Bld 112 (H) 70 - 99 mg/dL   BUN 17 6 - 20 mg/dL   Creatinine, Ser 06/11/20 0.61 - 1.24 mg/dL   Calcium 9.3 8.9 - 5.62 mg/dL   Total Protein 8.5 (H) 6.5 - 8.1 g/dL   Albumin 4.6 3.5 - 5.0 g/dL   AST 35 15 - 41 U/L   ALT 23 0 - 44 U/L   Alkaline Phosphatase 50 38 - 126 U/L   Total Bilirubin 1.1 0.3 - 1.2 mg/dL   GFR calc non Af Amer >60 >60 mL/min   GFR calc Af Amer >60 >60 mL/min   Anion gap 13 5 - 15  CBC  Result Value Ref Range   WBC 6.6 4.0 - 10.5 K/uL   RBC 5.19 4.22 - 5.81 MIL/uL   Hemoglobin 12.4 (L) 13.0 - 17.0 g/dL   HCT 13.0 (L) 39 - 52 %   MCV 75.0 (L) 80.0 - 100.0 fL   MCH 23.9 (  L) 26.0 - 34.0 pg   MCHC 31.9 30.0 - 36.0 g/dL   RDW 16.8 (H) 11.5 - 15.5 %   Platelets 388 150 - 400 K/uL   nRBC 0.0 0.0 - 0.2 %  Urinalysis, Routine w reflex microscopic  Result Value Ref Range   Color, Urine YELLOW YELLOW   APPearance CLEAR CLEAR   Specific Gravity, Urine 1.030 1.005 - 1.030   pH 7.0 5.0 - 8.0   Glucose, UA NEGATIVE NEGATIVE mg/dL   Hgb urine dipstick NEGATIVE NEGATIVE   Bilirubin Urine NEGATIVE NEGATIVE   Ketones, ur 20 (A) NEGATIVE mg/dL   Protein, ur 100 (A) NEGATIVE mg/dL   Nitrite NEGATIVE NEGATIVE   Leukocytes,Ua NEGATIVE NEGATIVE   RBC / HPF 6-10 0 - 5 RBC/hpf   WBC, UA 0-5 0 - 5 WBC/hpf   Bacteria, UA RARE (A) NONE SEEN   Mucus PRESENT   CK  Result Value Ref Range   Total CK 802 (H) 49.0 - 397.0 U/L  Magnesium  Result Value Ref Range   Magnesium 2.0 1.7 - 2.4 mg/dL   No results found.   MDM    Patient is was 44 year old male with a history of gastric ulcer presented today with epigastric pain that began after he stopped taking  his Prilosec.  He states that he ran out of his medication and has not taken anything for the past day or which time he developed some pain in his stomach.  This is preventing him from eating.  He is complaining primarily of cramps in his thighs calves abdominal wall for the last 2 days.  He has had profuse vomiting for the past 2 days  Patient was noted by prior provider to have no tenderness to palpation of the abdomen.  This is consistent with my exam.  When I evaluated patient he was sleeping peacefully.  His vital signs within normal limits denies any pain currently.  He has urinated and is received 2 bags of fluid.  Patient does appear to have been somewhat dehydrated.  Does not appear to be in acute rhabdomyolysis but does have elevated CK and therefore benefit from from the fluids that he has received.  Patient is CBC without leukocytosis he is chronically anemic and has improved anemia today consistent with hemoconcentration.  CMP notable for potassium 3.1.  He was given oral repletion today.  Magnesium within normal limits of 2.  CK elevated @ 802 however this is not consistent with rhabdomyolysis.  No evidence of kidney function damage or liver damage.  Urinalysis with ketones and protein consistent with again dehydration.  He is having no urinary symptoms.  Lipase within normal limits of pancreatitis.  As my read of abdominal exam is benign, lab work is benign, vital signs are within normal notes, he is well-appearing and states improved cramping at this time will discharge with recommendation of fluid/hydration, good content, Zofran for nausea, patient given his home PPI prescription and close follow-up with primary care doctor.  He is also given return precautions.  --  The medical records were personally reviewed by myself. I personally reviewed all lab results and interpreted all imaging studies and either concurred with their official read or contacted radiology for clarification.  Additional history obtained from old records  This patient appears reasonably screened and I doubt any other medical condition requiring further workup, evaluation, or treatment in the ED at this time prior to discharge.   Patient's vitals are WNL apart from vital sign abnormalities discussed  above, patient is in NAD, and able to ambulate in the ED at their baseline and able to tolerate PO.  Pain has been managed or a plan has been made for home management and has no complaints prior to discharge. Patient is comfortable with above plan and for discharge at this time. All questions were answered prior to disposition. Results from the ER workup discussed with the patient face to face and all questions answered to the best of my ability. The patient is safe for discharge with strict return precautions. Patient appears safe for discharge with appropriate follow-up. Conveyed my impression with the patient and they voiced understanding and are agreeable to plan.   An After Visit Summary was printed and given to the patient.  Portions of this note were generated with Scientist, clinical (histocompatibility and immunogenetics). Dictation errors may occur despite best attempts at proofreading.     Solon Augusta Winston, [place] 06/09/20 1034    Milagros Loll, MD 06/11/20 [phone]

## 2020-06-09 NOTE — ED Notes (Signed)
Per NT, pt able to stand at side of bed and void independently.

## 2020-06-09 NOTE — Discharge Instructions (Signed)
Please take your Prilosec as prescribed, I have prescribed you Zofran which she may use for nausea as needed, I also prescribed you some potassium supplements to take for the next few days.  Please take as prescribed.

## 2020-06-11 ENCOUNTER — Emergency Department (HOSPITAL_COMMUNITY)
Admission: EM | Admit: 2020-06-11 | Discharge: 2020-06-11 | Disposition: A | Discharge status: Home / Self Care | Payer: Self-pay | Attending: Emergency Medicine | Admitting: Emergency Medicine

## 2020-06-11 ENCOUNTER — Encounter (HOSPITAL_COMMUNITY): Payer: Self-pay | Admitting: Emergency Medicine

## 2020-06-11 DIAGNOSIS — F17210 Nicotine dependence, cigarettes, uncomplicated: Secondary | ICD-10-CM | POA: Insufficient documentation

## 2020-06-11 DIAGNOSIS — R1013 Epigastric pain: Secondary | ICD-10-CM | POA: Insufficient documentation

## 2020-06-11 LAB — CBC
HCT: 39.9 % (ref 39.0–52.0)
Hemoglobin: 12.4 g/dL — ABNORMAL LOW (ref 13.0–17.0)
MCH: 23.8 pg — ABNORMAL LOW (ref 26.0–34.0)
MCHC: 31.1 g/dL (ref 30.0–36.0)
MCV: 76.4 fL — ABNORMAL LOW (ref 80.0–100.0)
Platelets: 354 10*3/uL (ref 150–400)
RBC: 5.22 MIL/uL (ref 4.22–5.81)
RDW: 16.9 % — ABNORMAL HIGH (ref 11.5–15.5)
WBC: 7.7 10*3/uL (ref 4.0–10.5)
nRBC: 0 % (ref 0.0–0.2)

## 2020-06-11 LAB — COMPREHENSIVE METABOLIC PANEL
ALT: 40 U/L (ref 0–44)
AST: 77 U/L — ABNORMAL HIGH (ref 15–41)
Albumin: 4.3 g/dL (ref 3.5–5.0)
Alkaline Phosphatase: 48 U/L (ref 38–126)
Anion gap: 12 (ref 5–15)
BUN: 11 mg/dL (ref 6–20)
CO2: 31 mmol/L (ref 22–32)
Calcium: 9.1 mg/dL (ref 8.9–10.3)
Chloride: 94 mmol/L — ABNORMAL LOW (ref 98–111)
Creatinine, Ser: 0.99 mg/dL (ref 0.61–1.24)
GFR calc Af Amer: >60 mL/min (ref >60)
GFR calc non Af Amer: >60 mL/min (ref >60)
Glucose, Bld: 93 mg/dL (ref 70–99)
Potassium: 3.5 mmol/L (ref 3.5–5.1)
Sodium: 137 mmol/L (ref 135–145)
Total Bilirubin: 0.9 mg/dL (ref 0.3–1.2)
Total Protein: 8.3 g/dL — ABNORMAL HIGH (ref 6.5–8.1)

## 2020-06-11 LAB — TYPE AND SCREEN
ABO/RH(D): O POS
Antibody Screen: NEGATIVE

## 2020-06-11 LAB — LIPASE, BLOOD: Lipase: 24 U/L (ref 11–51)

## 2020-06-11 MED ORDER — PROMETHAZINE HCL 25 MG/ML IJ SOLN
12.5000 mg | Freq: Once | INTRAMUSCULAR | Status: AC
  Administered 2020-06-11: 12.5 mg via INTRAVENOUS
  Filled 2020-06-11: qty 1

## 2020-06-11 MED ORDER — LIDOCAINE VISCOUS HCL 2 % MT SOLN
15.0000 mL | Freq: Once | OROMUCOSAL | Status: AC
  Administered 2020-06-11: 15 mL via ORAL
  Filled 2020-06-11: qty 15

## 2020-06-11 MED ORDER — ONDANSETRON HCL 4 MG PO TABS
4.0000 mg | ORAL_TABLET | Freq: Four times a day (QID) | ORAL | 0 refills | Status: DC
Start: 2020-06-11 — End: 2020-11-02

## 2020-06-11 MED ORDER — DICYCLOMINE HCL 20 MG PO TABS
20.0000 mg | ORAL_TABLET | Freq: Four times a day (QID) | ORAL | 0 refills | Status: DC | PRN
Start: 2020-06-11 — End: 2020-11-02

## 2020-06-11 MED ORDER — LACTATED RINGERS IV BOLUS
2000.0000 mL | Freq: Once | INTRAVENOUS | Status: AC
  Administered 2020-06-11: 2000 mL via INTRAVENOUS

## 2020-06-11 MED ORDER — FAMOTIDINE IN NACL 20-0.9 MG/50ML-% IV SOLN
20.0000 mg | Freq: Once | INTRAVENOUS | Status: AC
  Administered 2020-06-11: 20 mg via INTRAVENOUS
  Filled 2020-06-11: qty 50

## 2020-06-11 MED ORDER — SUCRALFATE 1 GM/10ML PO SUSP
1.0000 g | Freq: Once | ORAL | Status: AC
  Administered 2020-06-11: 1 g via ORAL
  Filled 2020-06-11: qty 10

## 2020-06-11 MED ORDER — ALUM & MAG HYDROXIDE-SIMETH 200-200-20 MG/5ML PO SUSP
30.0000 mL | Freq: Once | ORAL | Status: AC
  Administered 2020-06-11: 30 mL via ORAL
  Filled 2020-06-11: qty 30

## 2020-06-11 NOTE — ED Triage Notes (Signed)
Patient here from home reporting GI bleeding. Reports that he is vomiting blood since Monday. Hx of same. Prilosec with no relief.

## 2020-06-12 LAB — ABO/RH: ABO/RH(D): O POS

## 2020-06-15 NOTE — ED Provider Notes (Signed)
[facility]-EMERGENCY DEPT Provider Note   CSN: [id number] Arrival date & time: 06/11/20  1649     History Chief Complaint  Patient presents with  . GI Bleeding  . Nausea  . Emesis    William Horne is a 44 y.o. male.  HPI   44yM with nausea, vomiting and upper abdominal pain.  He was seen in the emergency room 2 days ago for the same complaint.  He does not feel significantly better.  Today he noticed a small amount of blood mixed in his vomit so he came for repeat evaluation.  No blood in his stool.  Denies any blood thinners.  No fever.  No sick contacts.  Past Medical History:  Diagnosis Date  . Dental caries   . Eczema   . Gastritis   . Hepatitis C   . Shoulder dislocation     Patient Active Problem List   Diagnosis Date Noted  . Gastric ulcer, unspecified as acute or chronic, without hemorrhage or perforation 07/17/2019  . Tobacco use 07/17/2019  . Marijuana use 07/17/2019  . Nausea and vomiting 07/17/2019  . Microcytic anemia 07/17/2019    Past Surgical History:  Procedure Laterality Date  . BIOPSY  07/18/2019   Procedure: BIOPSY;  Surgeon: Charlott Rakes, MD;  Location: [facility] END[facility]COPY;  Service: Endoscopy;;  . ESOPHAGOGASTRODUODENOSCOPY (EGD) WITH PROPOFOL N/A 07/18/2019   Procedure: ESOPHAGOGASTRODUODENOSCOPY (EGD) WITH PROPOFOL;  Surgeon: Charlott Rakes, MD;  Location: [facility] ENDOS[facility];  Service: Endoscopy;  Laterality: N/A;       Family History  Problem Relation Age of Onset  . Rheum arthritis Mother   . Osteoarthritis Mother   . Hypertension Father   . Migraines Sister     Social History   Tobacco Use  . Smoking status: Current Every Day Smoker    Packs/day: 3.00    Years: 15.00    Pack years: 45.00    Types: Cigarettes  . Smokeless tobacco: Former Neurosurgeon    Quit date: 11/22/2009  Substance Use Topics  . Alcohol use: Yes    Comment: a beer a month ago  . Drug use: Yes    Types: Marijuana    Home  Medications Prior to Admission medications   Medication Sig Start Date End Date Taking? Authorizing Provider  omeprazole (PRILOSEC) 20 MG capsule Take 20 mg by mouth daily.   Yes [provider]  dicyclomine (BENTYL) 20 MG tablet Take 1 tablet (20 mg total) by mouth every 6 (six) hours as needed for spasms. 06/11/20   Raeford Razor, MD  omeprazole (PRILOSEC) 40 MG capsule Take 1 capsule (40 mg total) by mouth daily. Patient not taking: Reported on 06/11/2020 06/09/20   Gailen Shelter, PA  ondansetron (ZOFRAN ODT) 4 MG disintegrating tablet Take 1 tablet (4 mg total) by mouth every 8 (eight) hours as needed for nausea or vomiting. Patient not taking: Reported on 06/11/2020 06/09/20   Gailen Shelter, PA  ondansetron (ZOFRAN ODT) 4 MG disintegrating tablet Take 1 tablet (4 mg total) by mouth every 8 (eight) hours as needed for nausea or vomiting. Patient not taking: Reported on 06/11/2020 06/09/20   Gailen Shelter, PA  ondansetron (ZOFRAN) 4 MG tablet Take 1 tablet (4 mg total) by mouth every 6 (six) hours. 06/11/20   Raeford Razor, MD  potassium chloride SA (KLOR-CON) 20 MEQ tablet Take 1 tablet (20 mEq total) by mouth daily for 4 days. Patient not taking: Reported on 06/11/2020 06/09/20 06/13/20  Solon Augusta  S, PA    Allergies    Asa [aspirin]  Review of Systems   Review of Systems All systems reviewed and negative, other than as noted in HPI.  Physical Exam Updated Vital Signs BP 119/86   Pulse (!) 53   Temp 97.8 F (36.6 C) (Oral)   Resp 16   Ht 6\' 4"  (1.93 m)   Wt 84 kg   SpO2 100%   BMI 22.54 kg/m   Physical Exam Vitals and nursing note reviewed.  Constitutional:      General: He is not in acute distress.    Appearance: He is well-developed.  HENT:     Head: Normocephalic and atraumatic.  Eyes:     General:        Right eye: No discharge.        Left eye: No discharge.     Conjunctiva/sclera: Conjunctivae normal.  Cardiovascular:     Rate and Rhythm:  Normal rate and regular rhythm.     Heart sounds: Normal heart sounds. No murmur heard.  No friction rub. No gallop.   Pulmonary:     Effort: Pulmonary effort is normal. No respiratory distress.     Breath sounds: Normal breath sounds.  Abdominal:     General: There is no distension.     Palpations: Abdomen is soft.     Tenderness: There is abdominal tenderness.     Comments: Mild epigastric tenderness without rebound or guarding.  Musculoskeletal:        General: No tenderness.     Cervical back: Neck supple.  Skin:    General: Skin is warm and dry.  Neurological:     Mental Status: He is alert.  Psychiatric:        Behavior: Behavior normal.        Thought Content: Thought content normal.     ED Results / Procedures / Treatments   Labs (all labs ordered are listed, but only abnormal results are displayed) Labs Reviewed  COMPREHENSIVE METABOLIC PANEL - Abnormal; Notable for the following components:      Result Value   Chloride 94 (*)    Total Protein 8.3 (*)    AST 77 (*)    All other components within normal limits  CBC - Abnormal; Notable for the following components:   Hemoglobin 12.4 (*)    MCV 76.4 (*)    MCH 23.8 (*)    RDW 16.9 (*)    All other components within normal limits  LIPASE, BLOOD  TYPE AND SCREEN  ABO/RH    EKG None  Radiology No results found.  Procedures Procedures (including critical care time)  Medications Ordered in ED Medications  lactated ringers bolus 2,000 mL (0 mLs Intravenous Stopped 06/11/20 2210)  famotidine (PEPCID) IVPB 20 mg premix (0 mg Intravenous Stopped 06/11/20 2112)  promethazine (PHENERGAN) injection 12.5 mg (12.5 mg Intravenous Given 06/11/20 1834)  alum & mag hydroxide-simeth (MAALOX/MYLANTA) 200-200-20 MG/5ML suspension 30 mL (30 mLs Oral Given 06/11/20 1946)    And  lidocaine (XYLOCAINE) 2 % viscous mouth solution 15 mL (15 mLs Oral Given 06/11/20 1946)  sucralfate (CARAFATE) 1 GM/10ML suspension 1 g (1 g Oral  Given 06/11/20 2048)    ED Course  I have reviewed the triage vital signs and the nursing notes.  Pertinent labs & imaging results that were available during my care of the patient were reviewed by me and considered in my medical decision making (see chart for details).    MDM  Rules/Calculators/A&P                          44 year old male with epigastric tenderness and nausea/vomiting.  Treated symptomatically with marked improvement of symptoms.  No significant anemia.  I suspect this is probably Mallory-Weiss tear.  Very low suspicion for emergent upper GI bleed.  Plan continue PPI.  Bentyl as needed.  Return precautions discussed.  Outpatient GI follow-up otherwise.  Final Clinical Impression(s) / ED Diagnoses Final diagnoses:  Epigastric pain    Rx / DC Orders ED Discharge Orders         Ordered    ondansetron (ZOFRAN) 4 MG tablet  Every 6 hours     Discontinue  Reprint     06/11/20 2330    dicyclomine (BENTYL) 20 MG tablet  Every 6 hours PRN     Discontinue  Reprint     06/11/20 2330           Virgel Manifold, MD 06/15/20 1649

## 2020-10-31 ENCOUNTER — Emergency Department (HOSPITAL_COMMUNITY): Payer: Self-pay

## 2020-10-31 ENCOUNTER — Other Ambulatory Visit: Payer: Self-pay

## 2020-10-31 ENCOUNTER — Inpatient Hospital Stay (HOSPITAL_COMMUNITY)
Admission: EM | Admit: 2020-10-31 | Discharge: 2020-11-02 | DRG: 917 | Disposition: A | Discharge status: Home / Self Care | Payer: Self-pay | Attending: Internal Medicine | Admitting: Internal Medicine

## 2020-10-31 ENCOUNTER — Inpatient Hospital Stay (HOSPITAL_COMMUNITY): Payer: Self-pay

## 2020-10-31 ENCOUNTER — Encounter (HOSPITAL_COMMUNITY): Payer: Self-pay

## 2020-10-31 DIAGNOSIS — K029 Dental caries, unspecified: Secondary | ICD-10-CM | POA: Diagnosis present

## 2020-10-31 DIAGNOSIS — F1410 Cocaine abuse, uncomplicated: Secondary | ICD-10-CM | POA: Diagnosis present

## 2020-10-31 DIAGNOSIS — R252 Cramp and spasm: Secondary | ICD-10-CM | POA: Diagnosis present

## 2020-10-31 DIAGNOSIS — R404 Transient alteration of awareness: Secondary | ICD-10-CM | POA: Diagnosis present

## 2020-10-31 DIAGNOSIS — J9601 Acute respiratory failure with hypoxia: Secondary | ICD-10-CM | POA: Diagnosis present

## 2020-10-31 DIAGNOSIS — Z8249 Family history of ischemic heart disease and other diseases of the circulatory system: Secondary | ICD-10-CM

## 2020-10-31 DIAGNOSIS — B1920 Unspecified viral hepatitis C without hepatic coma: Secondary | ICD-10-CM | POA: Diagnosis present

## 2020-10-31 DIAGNOSIS — R001 Bradycardia, unspecified: Secondary | ICD-10-CM | POA: Diagnosis not present

## 2020-10-31 DIAGNOSIS — K2950 Unspecified chronic gastritis without bleeding: Secondary | ICD-10-CM | POA: Diagnosis present

## 2020-10-31 DIAGNOSIS — F1210 Cannabis abuse, uncomplicated: Secondary | ICD-10-CM | POA: Diagnosis present

## 2020-10-31 DIAGNOSIS — J690 Pneumonitis due to inhalation of food and vomit: Secondary | ICD-10-CM | POA: Diagnosis present

## 2020-10-31 DIAGNOSIS — N179 Acute kidney failure, unspecified: Secondary | ICD-10-CM | POA: Diagnosis present

## 2020-10-31 DIAGNOSIS — F17210 Nicotine dependence, cigarettes, uncomplicated: Secondary | ICD-10-CM | POA: Diagnosis present

## 2020-10-31 DIAGNOSIS — D509 Iron deficiency anemia, unspecified: Secondary | ICD-10-CM | POA: Diagnosis present

## 2020-10-31 DIAGNOSIS — R112 Nausea with vomiting, unspecified: Secondary | ICD-10-CM | POA: Diagnosis present

## 2020-10-31 DIAGNOSIS — Z886 Allergy status to analgesic agent status: Secondary | ICD-10-CM

## 2020-10-31 DIAGNOSIS — T40711A Poisoning by cannabis, accidental (unintentional), initial encounter: Secondary | ICD-10-CM | POA: Diagnosis present

## 2020-10-31 DIAGNOSIS — Z20822 Contact with and (suspected) exposure to covid-19: Secondary | ICD-10-CM | POA: Diagnosis present

## 2020-10-31 DIAGNOSIS — E860 Dehydration: Secondary | ICD-10-CM | POA: Diagnosis present

## 2020-10-31 DIAGNOSIS — J9600 Acute respiratory failure, unspecified whether with hypoxia or hypercapnia: Secondary | ICD-10-CM

## 2020-10-31 DIAGNOSIS — R4189 Other symptoms and signs involving cognitive functions and awareness: Secondary | ICD-10-CM | POA: Diagnosis present

## 2020-10-31 DIAGNOSIS — Z8261 Family history of arthritis: Secondary | ICD-10-CM

## 2020-10-31 DIAGNOSIS — T405X1A Poisoning by cocaine, accidental (unintentional), initial encounter: Principal | ICD-10-CM | POA: Diagnosis present

## 2020-10-31 DIAGNOSIS — R092 Respiratory arrest: Secondary | ICD-10-CM

## 2020-10-31 DIAGNOSIS — T50904A Poisoning by unspecified drugs, medicaments and biological substances, undetermined, initial encounter: Secondary | ICD-10-CM

## 2020-10-31 DIAGNOSIS — R079 Chest pain, unspecified: Secondary | ICD-10-CM | POA: Diagnosis not present

## 2020-10-31 DIAGNOSIS — R911 Solitary pulmonary nodule: Secondary | ICD-10-CM | POA: Diagnosis present

## 2020-10-31 DIAGNOSIS — R0902 Hypoxemia: Secondary | ICD-10-CM

## 2020-10-31 DIAGNOSIS — L309 Dermatitis, unspecified: Secondary | ICD-10-CM | POA: Diagnosis present

## 2020-10-31 LAB — CBC WITH DIFFERENTIAL/PLATELET
Abs Immature Granulocytes: 0.19 10*3/uL — ABNORMAL HIGH (ref 0.00–0.07)
Basophils Absolute: 0.1 10*3/uL (ref 0.0–0.1)
Basophils Relative: 0 %
Eosinophils Absolute: 0.2 10*3/uL (ref 0.0–0.5)
Eosinophils Relative: 1 %
HCT: 37.2 % — ABNORMAL LOW (ref 39.0–52.0)
Hemoglobin: 11.1 g/dL — ABNORMAL LOW (ref 13.0–17.0)
Immature Granulocytes: 1 %
Lymphocytes Relative: 19 %
Lymphs Abs: 3.2 10*3/uL (ref 0.7–4.0)
MCH: 23.1 pg — ABNORMAL LOW (ref 26.0–34.0)
MCHC: 29.8 g/dL — ABNORMAL LOW (ref 30.0–36.0)
MCV: 77.3 fL — ABNORMAL LOW (ref 80.0–100.0)
Monocytes Absolute: 1.3 10*3/uL — ABNORMAL HIGH (ref 0.1–1.0)
Monocytes Relative: 8 %
Neutro Abs: 12 10*3/uL — ABNORMAL HIGH (ref 1.7–7.7)
Neutrophils Relative %: 71 %
Platelets: 359 10*3/uL (ref 150–400)
RBC: 4.81 MIL/uL (ref 4.22–5.81)
RDW: 19.1 % — ABNORMAL HIGH (ref 11.5–15.5)
WBC: 16.9 10*3/uL — ABNORMAL HIGH (ref 4.0–10.5)
nRBC: 0 % (ref 0.0–0.2)

## 2020-10-31 LAB — URINALYSIS, ROUTINE W REFLEX MICROSCOPIC
Bilirubin Urine: NEGATIVE
Glucose, UA: 50 mg/dL — AB
Hgb urine dipstick: NEGATIVE
Ketones, ur: NEGATIVE mg/dL
Leukocytes,Ua: NEGATIVE
Nitrite: NEGATIVE
Protein, ur: 30 mg/dL — AB
Specific Gravity, Urine: 1.012 (ref 1.005–1.030)
pH: 6 (ref 5.0–8.0)

## 2020-10-31 LAB — RAPID URINE DRUG SCREEN, HOSP PERFORMED
Amphetamines: NOT DETECTED
Barbiturates: NOT DETECTED
Benzodiazepines: NOT DETECTED
Cocaine: POSITIVE — AB
Opiates: NOT DETECTED
Tetrahydrocannabinol: POSITIVE — AB

## 2020-10-31 LAB — CBC
HCT: 33.8 % — ABNORMAL LOW (ref 39.0–52.0)
Hemoglobin: 10.2 g/dL — ABNORMAL LOW (ref 13.0–17.0)
MCH: 23.7 pg — ABNORMAL LOW (ref 26.0–34.0)
MCHC: 30.2 g/dL (ref 30.0–36.0)
MCV: 78.4 fL — ABNORMAL LOW (ref 80.0–100.0)
Platelets: 262 10*3/uL (ref 150–400)
RBC: 4.31 MIL/uL (ref 4.22–5.81)
RDW: 18.7 % — ABNORMAL HIGH (ref 11.5–15.5)
WBC: 14.2 10*3/uL — ABNORMAL HIGH (ref 4.0–10.5)
nRBC: 0 % (ref 0.0–0.2)

## 2020-10-31 LAB — BLOOD GAS, ARTERIAL
Acid-Base Excess: 1.2 mmol/L (ref 0.0–2.0)
Bicarbonate: 26.9 mmol/L (ref 20.0–28.0)
Drawn by: 23281
FIO2: 100
MECHVT: 600 mL
O2 Saturation: 99.4 %
PEEP: 5 cmH2O
Patient temperature: 97.4
RATE: 20 resp/min
pCO2 arterial: 49 mmHg — ABNORMAL HIGH (ref 32.0–48.0)
pH, Arterial: 7.354 (ref 7.350–7.450)
pO2, Arterial: 190 mmHg — ABNORMAL HIGH (ref 83.0–108.0)

## 2020-10-31 LAB — ETHANOL: Alcohol, Ethyl (B): <10 mg/dL (ref <10)

## 2020-10-31 LAB — COMPREHENSIVE METABOLIC PANEL
ALT: 28 U/L (ref 0–44)
AST: 35 U/L (ref 15–41)
Albumin: 4.2 g/dL (ref 3.5–5.0)
Alkaline Phosphatase: 54 U/L (ref 38–126)
Anion gap: 11 (ref 5–15)
BUN: 11 mg/dL (ref 6–20)
CO2: 25 mmol/L (ref 22–32)
Calcium: 8.6 mg/dL — ABNORMAL LOW (ref 8.9–10.3)
Chloride: 105 mmol/L (ref 98–111)
Creatinine, Ser: 1.35 mg/dL — ABNORMAL HIGH (ref 0.61–1.24)
GFR, Estimated: >60 mL/min (ref >60)
Glucose, Bld: 96 mg/dL (ref 70–99)
Potassium: 3.5 mmol/L (ref 3.5–5.1)
Sodium: 141 mmol/L (ref 135–145)
Total Bilirubin: 0.4 mg/dL (ref 0.3–1.2)
Total Protein: 7.9 g/dL (ref 6.5–8.1)

## 2020-10-31 LAB — CBG MONITORING, ED: Glucose-Capillary: 74 mg/dL (ref 70–99)

## 2020-10-31 LAB — CREATININE, SERUM
Creatinine, Ser: 1.26 mg/dL — ABNORMAL HIGH (ref 0.61–1.24)
GFR, Estimated: >60 mL/min (ref >60)

## 2020-10-31 LAB — RESPIRATORY PANEL BY RT PCR (FLU A&B, COVID)
Influenza A by PCR: NEGATIVE
Influenza B by PCR: NEGATIVE
SARS Coronavirus 2 by RT PCR: NEGATIVE

## 2020-10-31 LAB — MRSA PCR SCREENING: MRSA by PCR: NEGATIVE

## 2020-10-31 LAB — HIV ANTIBODY (ROUTINE TESTING W REFLEX): HIV Screen 4th Generation wRfx: NONREACTIVE

## 2020-10-31 LAB — COOXEMETRY PANEL
Carboxyhemoglobin: 2.8 % — ABNORMAL HIGH (ref 0.5–1.5)
Methemoglobin: 1 % (ref 0.0–1.5)
O2 Saturation: 98.7 %
Total hemoglobin: 10.6 g/dL — ABNORMAL LOW (ref 12.0–16.0)

## 2020-10-31 LAB — TROPONIN I (HIGH SENSITIVITY)
Troponin I (High Sensitivity): 7 ng/L (ref <18)
Troponin I (High Sensitivity): 17 ng/L (ref <18)

## 2020-10-31 LAB — ACETAMINOPHEN LEVEL: Acetaminophen (Tylenol), Serum: <10 ug/mL — ABNORMAL LOW (ref 10–30)

## 2020-10-31 LAB — SALICYLATE LEVEL: Salicylate Lvl: <7 mg/dL — ABNORMAL LOW (ref 7.0–30.0)

## 2020-10-31 LAB — POC OCCULT BLOOD, ED: Fecal Occult Bld: NEGATIVE

## 2020-10-31 LAB — STREP PNEUMONIAE URINARY ANTIGEN: Strep Pneumo Urinary Antigen: NEGATIVE

## 2020-10-31 LAB — LACTIC ACID, PLASMA: Lactic Acid, Venous: 2 mmol/L (ref 0.5–1.9)

## 2020-10-31 MED ORDER — POLYETHYLENE GLYCOL 3350 17 G PO PACK: 17.0000 g | PACK | Freq: Every day | ORAL | Status: DC | PRN

## 2020-10-31 MED ORDER — IPRATROPIUM-ALBUTEROL 0.5-2.5 (3) MG/3ML IN SOLN
3.0000 mL | Freq: Once | RESPIRATORY_TRACT | Status: AC
  Administered 2020-10-31: 3 mL via RESPIRATORY_TRACT
  Filled 2020-10-31: qty 3

## 2020-10-31 MED ORDER — FENTANYL CITRATE (PF) 100 MCG/2ML IJ SOLN: 50.0000 ug | Freq: Once | INTRAMUSCULAR | Status: DC

## 2020-10-31 MED ORDER — ONDANSETRON HCL 4 MG/2ML IJ SOLN
INTRAMUSCULAR | Status: AC
  Filled 2020-10-31: qty 2

## 2020-10-31 MED ORDER — ORAL CARE MOUTH RINSE
15.0000 mL | OROMUCOSAL | Status: DC
  Administered 2020-10-31 (×3): 15 mL via OROMUCOSAL

## 2020-10-31 MED ORDER — DOCUSATE SODIUM 100 MG PO CAPS: 100.0000 mg | ORAL_CAPSULE | Freq: Two times a day (BID) | ORAL | Status: DC | PRN

## 2020-10-31 MED ORDER — CHLORHEXIDINE GLUCONATE CLOTH 2 % EX PADS
6.0000 | MEDICATED_PAD | Freq: Every day | CUTANEOUS | Status: DC
  Administered 2020-10-31 – 2020-11-01 (×2): 6 via TOPICAL

## 2020-10-31 MED ORDER — FENTANYL 2500MCG IN NS 250ML (10MCG/ML) PREMIX INFUSION: 20.0000 ug/h | INTRAVENOUS | Status: DC

## 2020-10-31 MED ORDER — MIDAZOLAM HCL 2 MG/2ML IJ SOLN
INTRAMUSCULAR | Status: AC
  Filled 2020-10-31: qty 2

## 2020-10-31 MED ORDER — POLYETHYLENE GLYCOL 3350 17 G PO PACK
17.0000 g | PACK | Freq: Every day | ORAL | Status: DC
  Administered 2020-11-02: 17 g
  Filled 2020-10-31: qty 1

## 2020-10-31 MED ORDER — PROPOFOL 1000 MG/100ML IV EMUL
5.0000 ug/kg/min | INTRAVENOUS | Status: DC
  Administered 2020-10-31: 50 ug/kg/min via INTRAVENOUS
  Administered 2020-10-31: 10 ug/kg/min via INTRAVENOUS
  Administered 2020-10-31: 50 ug/kg/min via INTRAVENOUS
  Administered 2020-10-31: 60 ug/kg/min via INTRAVENOUS
  Filled 2020-10-31: qty 200
  Filled 2020-10-31 (×2): qty 100

## 2020-10-31 MED ORDER — POLYETHYLENE GLYCOL 3350 17 G PO PACK: 17.0000 g | PACK | Freq: Every day | ORAL | Status: DC

## 2020-10-31 MED ORDER — PROPOFOL 500 MG/50ML IV EMUL
INTRAVENOUS | Status: AC
  Filled 2020-10-31: qty 50

## 2020-10-31 MED ORDER — LACTATED RINGERS IV SOLN: INTRAVENOUS | Status: DC

## 2020-10-31 MED ORDER — ETOMIDATE 2 MG/ML IV SOLN: 30.0000 mg | Freq: Once | INTRAVENOUS | Status: AC

## 2020-10-31 MED ORDER — MIDAZOLAM HCL 2 MG/2ML IJ SOLN
2.0000 mg | Freq: Once | INTRAMUSCULAR | Status: AC
  Administered 2020-10-31: 2 mg via INTRAVENOUS

## 2020-10-31 MED ORDER — ROCURONIUM BROMIDE 10 MG/ML (PF) SYRINGE
PREFILLED_SYRINGE | INTRAVENOUS | Status: AC
  Filled 2020-10-31: qty 10

## 2020-10-31 MED ORDER — DOCUSATE SODIUM 50 MG/5ML PO LIQD: 100.0000 mg | Freq: Two times a day (BID) | ORAL | Status: DC

## 2020-10-31 MED ORDER — FENTANYL CITRATE (PF) 100 MCG/2ML IJ SOLN
INTRAMUSCULAR | Status: AC
  Filled 2020-10-31: qty 2

## 2020-10-31 MED ORDER — ROCURONIUM BROMIDE 50 MG/5ML IV SOLN
80.0000 mg | Freq: Once | INTRAVENOUS | Status: AC
  Administered 2020-10-31: 80 mg via INTRAVENOUS
  Filled 2020-10-31: qty 8

## 2020-10-31 MED ORDER — CHLORHEXIDINE GLUCONATE 0.12% ORAL RINSE (MEDLINE KIT)
15.0000 mL | Freq: Two times a day (BID) | OROMUCOSAL | Status: DC
  Administered 2020-10-31: 15 mL via OROMUCOSAL

## 2020-10-31 MED ORDER — PANTOPRAZOLE SODIUM 40 MG IV SOLR
40.0000 mg | Freq: Every day | INTRAVENOUS | Status: DC
  Administered 2020-10-31 – 2020-11-01 (×2): 40 mg via INTRAVENOUS
  Filled 2020-10-31 (×2): qty 40

## 2020-10-31 MED ORDER — VECURONIUM BROMIDE 10 MG IV SOLR
INTRAVENOUS | Status: AC
  Filled 2020-10-31: qty 10

## 2020-10-31 MED ORDER — SUCCINYLCHOLINE CHLORIDE 200 MG/10ML IV SOSY
PREFILLED_SYRINGE | INTRAVENOUS | Status: AC
  Filled 2020-10-31: qty 10

## 2020-10-31 MED ORDER — MIDAZOLAM HCL 2 MG/2ML IJ SOLN
INTRAMUSCULAR | Status: AC
  Filled 2020-10-31: qty 4

## 2020-10-31 MED ORDER — ETOMIDATE 2 MG/ML IV SOLN
INTRAVENOUS | Status: AC
  Filled 2020-10-31: qty 20

## 2020-10-31 MED ORDER — HEPARIN SODIUM (PORCINE) 5000 UNIT/ML IJ SOLN
5000.0000 [IU] | Freq: Three times a day (TID) | INTRAMUSCULAR | Status: DC
  Administered 2020-10-31 – 2020-11-01 (×5): 5000 [IU] via SUBCUTANEOUS
  Filled 2020-10-31 (×4): qty 1

## 2020-10-31 MED ORDER — SODIUM CHLORIDE 0.9 % IV SOLN
3.0000 g | Freq: Four times a day (QID) | INTRAVENOUS | Status: DC
  Administered 2020-10-31 – 2020-11-02 (×8): 3 g via INTRAVENOUS
  Filled 2020-10-31: qty 3
  Filled 2020-10-31: qty 8
  Filled 2020-10-31: qty 3
  Filled 2020-10-31: qty 8
  Filled 2020-10-31: qty 3
  Filled 2020-10-31 (×2): qty 8
  Filled 2020-10-31: qty 3
  Filled 2020-10-31: qty 8

## 2020-10-31 MED ORDER — LIDOCAINE HCL (CARDIAC) PF 100 MG/5ML IV SOSY
PREFILLED_SYRINGE | INTRAVENOUS | Status: AC
  Filled 2020-10-31: qty 5

## 2020-10-31 MED ORDER — ONDANSETRON HCL 4 MG/2ML IJ SOLN
4.0000 mg | Freq: Once | INTRAMUSCULAR | Status: AC
  Administered 2020-10-31: 4 mg via INTRAVENOUS

## 2020-10-31 MED ORDER — STERILE WATER FOR INJECTION IJ SOLN
INTRAMUSCULAR | Status: AC
  Filled 2020-10-31: qty 10

## 2020-10-31 MED ORDER — ETOMIDATE 2 MG/ML IV SOLN
INTRAVENOUS | Status: AC
  Administered 2020-10-31: 20 mg via INTRAVENOUS
  Filled 2020-10-31: qty 10

## 2020-10-31 MED ORDER — FENTANYL 2500MCG IN NS 250ML (10MCG/ML) PREMIX INFUSION: 50.0000 ug/h | INTRAVENOUS | Status: DC

## 2020-10-31 MED ORDER — FENTANYL BOLUS VIA INFUSION
50.0000 ug | INTRAVENOUS | Status: DC | PRN
  Filled 2020-10-31: qty 50

## 2020-10-31 NOTE — ED Notes (Signed)
Patient sits up in bed grabbing at Tube. Restraints applied and RT in room to reposition tube due to being 22 @ lip. RT repositioned to 25 at lip and called xray to confirm placement. PA in room and confirmed with breath sounds. Medication titrate increased

## 2020-10-31 NOTE — ED Triage Notes (Signed)
Pt IV removed while transferring from stretcher.

## 2020-10-31 NOTE — Progress Notes (Signed)
Pt transported from ED to CT and back on vent 100% fio2.  Pt tolerated transport well without incident. 

## 2020-10-31 NOTE — ED Notes (Signed)
Xray in room. Patient raising up in bed pulling at restraints. Dose increased and suction provided. Family in room at this time

## 2020-10-31 NOTE — ED Triage Notes (Addendum)
Pt arrives EMS from hotel. GF called EMS after pt was found unresponsive. EMS reports pt was apneic and pinpoint pupils. 3.5mg  Narcan IV administered with good results. Pt denies and substance use. Pt drowsy and keeps falling asleep.    3.5mg  Narcan IV Hr 84 116/80 spo2 93% ra (when awake and drops to 80's when asleep). cbg 293

## 2020-10-31 NOTE — ED Provider Notes (Addendum)
[facility]-EMERGENCY DEPT Provider Note   CSN: [id number] Arrival date & time: 10/31/20  0445     History Chief Complaint  Patient presents with  . Drug Overdose    William Horne is a 44 y.o. male.  Patient with past medical history notable for hepatitis C, gastritis, marijuana use, presents to the emergency department with a chief complaint of unresponsive.  Per EMS, they were called to a hotel after a woman found the patient unresponsive.  EMS reports that the patient was apneic and had pinpoint pupils.  2 doses of Narcan were given, for a total of 3.5 mg.  The patient woke up some, but would quickly fall back to sleep.  Patient denies any drug use.  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  He is unvaccinated against COVID.  He denies any recent illnesses.  The history is provided by the patient. No language interpreter was used.       Past Medical History:  Diagnosis Date  . Dental caries   . Eczema   . Gastritis   . Hepatitis C   . Shoulder dislocation     Patient Active Problem List   Diagnosis Date Noted  . Gastric ulcer, unspecified as acute or chronic, without hemorrhage or perforation 07/17/2019  . Tobacco use 07/17/2019  . Marijuana use 07/17/2019  . Nausea and vomiting 07/17/2019  . Microcytic anemia 07/17/2019    Past Surgical History:  Procedure Laterality Date  . BIOPSY  07/18/2019   Procedure: BIOPSY;  Surgeon: Charlott RakesSchooler, Vincent, MD;  Location: [facility];  Service: Endoscopy;;  . ESOPHAGOGASTRODUODENOSCOPY (EGD) WITH PROPOFOL N/A 07/18/2019   Procedure: ESOPHAGOGASTRODUODENOSCOPY (EGD) WITH PROPOFOL;  Surgeon: Charlott RakesSchooler, Vincent, MD;  Location: [facility];  Service: Endoscopy;  Laterality: N/A;       Family History  Problem Relation Age of Onset  . Rheum arthritis Mother   . Osteoarthritis Mother   . Hypertension Father   . Migraines Sister     Social  History   Tobacco Use  . Smoking status: Current Every Day Smoker    Packs/day: 3.00    Years: 15.00    Pack years: 45.00    Types: Cigarettes  . Smokeless tobacco: Former NeurosurgeonUser    Quit date: 11/22/2009  Substance Use Topics  . Alcohol use: Yes    Comment: a beer a month ago  . Drug use: Yes    Types: Marijuana    Home Medications Prior to Admission medications   Medication Sig Start Date End Date Taking? Authorizing Provider  dicyclomine (BENTYL) 20 MG tablet Take 1 tablet (20 mg total) by mouth every 6 (six) hours as needed for spasms. 06/11/20   Raeford RazorKohut, Stephen, MD  omeprazole (PRILOSEC) 20 MG capsule Take 20 mg by mouth daily.    [provider]  omeprazole (PRILOSEC) 40 MG capsule Take 1 capsule (40 mg total) by mouth daily. Patient not taking: Reported on 06/11/2020 06/09/20   Gailen ShelterFondaw, Wylder S, PA  ondansetron (ZOFRAN ODT) 4 MG disintegrating tablet Take 1 tablet (4 mg total) by mouth every 8 (eight) hours as needed for nausea or vomiting. Patient not taking: Reported on 06/11/2020 06/09/20   Gailen ShelterFondaw, Wylder S, PA  ondansetron (ZOFRAN ODT) 4 MG disintegrating tablet Take 1 tablet (4 mg total) by mouth every 8 (eight) hours as needed for nausea or vomiting. Patient not taking: Reported on 06/11/2020 06/09/20   Blanchie DessertFondaw,  Wylder S, PA  ondansetron (ZOFRAN) 4 MG tablet Take 1 tablet (4 mg total) by mouth every 6 (six) hours. 06/11/20   Raeford Razor, MD  potassium chloride SA (KLOR-CON) 20 MEQ tablet Take 1 tablet (20 mEq total) by mouth daily for 4 days. Patient not taking: Reported on 06/11/2020 06/09/20 06/13/20  Gailen Shelter, PA    Allergies    Asa [aspirin]  Review of Systems   Review of Systems  All other systems reviewed and are negative.   Physical Exam Updated Vital Signs BP (!) 149/135 (BP Location: Left Arm)   Pulse (!) 112   Temp (!) 97.5 F (36.4 C) (Oral)   Resp 14   Ht 6\' 4"  (1.93 m)   Wt 86 kg   SpO2 100%   BMI 23.08 kg/m   Physical Exam Vitals  and nursing note reviewed.  Constitutional:      Appearance: He is well-developed.  HENT:     Head: Normocephalic and atraumatic.  Eyes:     Conjunctiva/sclera: Conjunctivae normal.     Comments: Pinpoint pupils  Cardiovascular:     Rate and Rhythm: Normal rate and regular rhythm.     Heart sounds: No murmur heard.   Pulmonary:     Effort: Pulmonary effort is normal. No respiratory distress.     Breath sounds: Wheezing present.  Abdominal:     Palpations: Abdomen is soft.     Tenderness: There is no abdominal tenderness.  Musculoskeletal:     Cervical back: Neck supple.  Skin:    General: Skin is warm and dry.  Neurological:     Comments: somnolent   Psychiatric:     Comments: Unable to assess     ED Results / Procedures / Treatments   Labs (all labs ordered are listed, but only abnormal results are displayed) Labs Reviewed  RESPIRATORY PANEL BY RT PCR (FLU A&B, COVID)  COMPREHENSIVE METABOLIC PANEL  SALICYLATE LEVEL  ACETAMINOPHEN LEVEL  ETHANOL  RAPID URINE DRUG SCREEN, HOSP PERFORMED  CBC WITH DIFFERENTIAL/PLATELET  LACTIC ACID, PLASMA  COOXEMETRY PANEL  BLOOD GAS, ARTERIAL  CBG MONITORING, ED  TROPONIN I (HIGH SENSITIVITY)    EKG EKG Interpretation  Date/Time:  Friday October 31 2020 05:40:50 EST Ventricular Rate:  66 PR Interval:    QRS Duration: 90 QT Interval:  446 QTC Calculation: 468 R Axis:   143 Text Interpretation: Sinus or ectopic atrial rhythm Atrial premature complex Right axis deviation Abnrm T, consider ischemia, anterolateral lds suspect limb lead reversal Confirmed by 08-15-1982, April (Nicanor Alcon) on 10/31/2020 5:56:21 AM   Radiology DG Chest Port 1 View  Result Date: 10/31/2020 CLINICAL DATA:  Shortness of breath. EXAM: PORTABLE CHEST 1 VIEW COMPARISON:  11/20/2011 FINDINGS: The cardiac silhouette, mediastinal and hilar contours are within normal limits. The lungs demonstrate hyperinflation and probable mild underlying emphysematous  changes. Right lower lobe airspace process suspicious for pneumonia. No pleural effusions. The bony thorax is intact. IMPRESSION: Right lower lobe infiltrate. Electronically Signed   By: 14/12/2010 M.D.   On: 10/31/2020 06:12    Procedures Procedure Name: Intubation Date/Time: 10/31/2020 6:19 AM Performed by: 13/11/2020, PA-C Pre-anesthesia Checklist: Patient identified, Patient being monitored, Emergency Drugs available, Timeout performed and Suction available Oxygen Delivery Method: Non-rebreather mask Preoxygenation: Pre-oxygenation with 100% oxygen Induction Type: Rapid sequence Ventilation: Mask ventilation without difficulty Laryngoscope Size: 3 and Glidescope Tube size: 7.5 mm Number of attempts: 1 Airway Equipment and Method: Patient positioned with wedge pillow,  Video-laryngoscopy and  Rigid stylet Placement Confirmation: ETT inserted through vocal cords under direct vision,  CO2 detector,  Breath sounds checked- equal and bilateral and Positive ETCO2 Secured at: 26 cm Tube secured with: ETT holder Dental Injury: Teeth and Oropharynx as per pre-operative assessment      .Critical Care Performed by: Roxy Horseman, PA-C Authorized by: Roxy Horseman, PA-C   Critical care provider statement:    Critical care time (minutes):  35   Critical care was necessary to treat or prevent imminent or life-threatening deterioration of the following conditions:  Respiratory failure and toxidrome   Critical care was time spent personally by me on the following activities:  Discussions with consultants, evaluation of patient's response to treatment, examination of patient, ordering and performing treatments and interventions, ordering and review of laboratory studies, ordering and review of radiographic studies, pulse oximetry, re-evaluation of patient's condition, obtaining history from patient or surrogate and review of old charts   (including critical care time)  Medications  Ordered in ED Medications  rocuronium bromide 100 MG/10ML SOSY (has no administration in time range)  propofol (DIPRIVAN) 500 MG/50ML infusion (has no administration in time range)  etomidate (AMIDATE) injection 30 mg (has no administration in time range)  rocuronium (ZEMURON) injection 80 mg (has no administration in time range)  propofol (DIPRIVAN) 1000 MG/100ML infusion (has no administration in time range)  ondansetron (ZOFRAN) injection 4 mg (4 mg Intravenous Given 10/31/20 0529)    ED Course  I have reviewed the triage vital signs and the nursing notes.  Pertinent labs & imaging results that were available during my care of the patient were reviewed by me and considered in my medical decision making (see chart for details).    MDM Rules/Calculators/A&P                          Patient brought in by EMS after he was found unresponsive in a hotel room.  He was initially apneic and had pinpoint pupils.  2 rounds of Narcan were administered for a total of 3.5 mg IV with good results.  By the time patient arrived at the emergency department he was drowsy again.  His O2 saturation was in the low 60s to upper 50s on room air.  This was with good waveform.  Patient was put on nonrebreather and SPO2 increased to the mid 90s.  He had subsequent vomiting and desaturation to the mid 80s.    Patient will communicate when spoken to, but immediately falls back to sleep when he is not being stimulated.  Patient seen by discussed with Dr. Nicanor Alcon.  Decision was made by attending to intubate patient for airway protection due to high O2 saturation dropping to the mid 80s despite being on nonrebreather and patient continuing to be difficult to arouse.  Unclear what the exact etiology of patient's hypoxia is.  He does have some mild wheezing.  Will give albuterol through event.  Preintubation chest x-ray revealed a right lower lobe infiltrate, but no pneumothorax.  Labs are pending, including CBC, CMP,  ethanol, salicylate, acetaminophen, cooximetry, ABG, lactic, and troponin.  CT head ordered.  Hemoccult negative.  Case discussed with intensivist, who will come to admit the patient.  Appreciate their help in this case.  Final Clinical Impression(s) / ED Diagnoses Final diagnoses:  Drug overdose, undetermined intent, initial encounter  Hypoxia  Nausea and vomiting, intractability of vomiting not specified, unspecified vomiting type  Respiratory arrest (HCC)    Rx /  DC Orders ED Discharge Orders    None       Yohan, Samons, PA-C 10/31/20 0620    Roxy Horseman, PA-C 10/31/20 [id number]    Nicanor Alcon, April, MD 10/31/20 [phone]

## 2020-10-31 NOTE — Progress Notes (Signed)
[name], [name], rr20, [name], William Horne (MRN [id number]) as of 10/31/2020 06:51  Ref. Range 10/31/2020 06:02  Delivery systems Unknown VENTILATOR  FIO2 Unknown 100.00  Mode Unknown PRESSURE REGULATED VOLUME CONTROL  VT Latest Units: mL 600  Peep/cpap Latest Units: cm H20 5.0  pH, Arterial Latest Ref Range: 7.35 - 7.45  7.354  pCO2 arterial Latest Ref Range: 32 - 48 mmHg 49.0 (H)  pO2, Arterial Latest Ref Range: 83 - 108 mmHg 190 (H)  Acid-Base Excess Latest Ref Range: 0.0 - 2.0 mmol/L 1.2  Bicarbonate Latest Ref Range: 20.0 - 28.0 mmol/L 26.9  O2 Saturation Latest Units: % 99.4  Patient temperature Unknown 97.4  Collection site Unknown LEFT RADIAL  Allens test (pass/fail) Latest Ref Range: PASS  PASS

## 2020-10-31 NOTE — ED Notes (Signed)
RT in room at this time. 

## 2020-10-31 NOTE — ED Notes (Signed)
In room at this time to hang new bottle of propofol, patient resting with eyes closed. Family in room. Bilateral soft wrist restraints.

## 2020-10-31 NOTE — Progress Notes (Signed)
Pharmacy Antibiotic Note  William Horne is a 44 y.o. male admitted on 10/31/2020 with aspiration pneumonia.  Pharmacy has been consulted for Unasyn dosing.  Plan:  Unasyn 3g IV q6h  Follow up renal function, culture results, and clinical course.   Height: 6\' 4"  (193 cm) Weight: 86 kg (189 lb 9.5 oz) IBW/kg (Calculated) : 86.8  Temp (24hrs), Avg:97.7 F (36.5 C), Min:97.5 F (36.4 C), Max:97.8 F (36.6 C)  Recent Labs  Lab 10/31/20 0602  WBC 16.9*  CREATININE 1.35*  LATICACIDVEN 2.0*    Estimated Creatinine Clearance: 85.8 mL/min (A) (by C-G formula based on SCr of 1.35 mg/dL (H)).    Allergies  Allergen Reactions  . Asa [Aspirin] Nausea And Vomiting    Antimicrobials this admission: 11/12 Unasyn >>   Dose adjustments this admission:   Microbiology results:   Thank you for allowing pharmacy to be a part of this patient's care.  13/12 PharmD, BCPS Clinical Pharmacist [facility] main pharmacy [phone] 10/31/2020 7:23 AM

## 2020-10-31 NOTE — Procedures (Signed)
Extubation Procedure Note  Patient Details:   Name: William Horne DOB: 04-27-1976 MRN: [id number]   Airway Documentation:  Airway 7.5 mm (Active)  Secured at (cm) 25 cm 10/31/20 1520  Measured From Lips 10/31/20 1520  Secured Location Right 10/31/20 1520  Secured By [employer] 10/31/20 1520  Tube Holder Repositioned Yes 10/31/20 1520  Prone position No 10/31/20 1121  Cuff Pressure (cm H2O) 26 cm H2O 10/31/20 1121   Vent end date: 10/31/20 Vent end time: 1413   Evaluation  O2 sats: stable throughout Complications: No apparent complications Patient did tolerate procedure well. Bilateral Breath Sounds: Clear, Diminished   Yes  Dannielle Karvonen 10/31/2020, 4:16 PM

## 2020-10-31 NOTE — H&P (Addendum)
NAME:  William Horne, MRN:  [id number], DOB:  [birth year], LOS: 0 ADMISSION DATE:  10/31/2020, CONSULTATION DATE: 10/31/2020 REFERRING MD: April Palombo, MD, CHIEF COMPLAINT: Unresponsiveness  Brief History    44 year old with history of hepatitis C, gastritis, marijuana use.  Found unresponsive, apneic in a hotel with pinpoint pupils.  Responded initially to Narcan but became more unresponsive in the ED and intubated.  PCCM consulted for further evaluation.  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  Significant Hospital Events   11/12-Admit  Consults:  PCCM  Procedures:    Significant Diagnostic Tests:  Chest x-ray 11/12-hazy perihilar opacity.  CT head 11/12-  Micro Data:  Blood cultures 11/12 Sputum cultures 11/12  Antimicrobials:  Unasyn 11/12 >>  Interim history/subjective:    Objective   Blood pressure (!) 149/135, pulse (!) 112, temperature (!) 97.5 F (36.4 C), temperature source Oral, resp. rate 14, height 6\' 4"  (1.93 m), weight 86 kg, SpO2 100 %.    Vent Mode: PRVC FiO2 (%):  [100 %] 100 % Set Rate:  [20 bmp] 20 bmp Vt Set:  [600 mL] 600 mL PEEP:  [5 cmH20] 5 cmH20 Plateau Pressure:  [18 cmH20] 18 cmH20  No intake or output data in the 24 hours ending 10/31/20 0645 Filed Weights   10/31/20 0457  Weight: 86 kg    Examination: Blood pressure 135/89, pulse 73, temperature (!) 97.5 F (36.4 C), temperature source Oral, resp. rate 12, height 6\' 4"  (1.93 m), weight 86 kg, SpO2 100 %. Gen:      No acute distress HEENT:  EOMI, sclera anicteric, ET tube Neck:     No masses; no thyromegaly Lungs:    Clear to auscultation bilaterally; normal respiratory effort CV:         Regular rate and rhythm; no murmurs Abd:      + bowel sounds; soft, non-tender; no palpable masses,  no distension Ext:    No edema; adequate peripheral perfusion Skin:      Warm and dry; no rash Neuro: [facility] Problem list     Assessment & Plan:  Unresponsiveness Suspect drug overdose, possible toxic exposure during his work as a Follow CT head, urine tox screen, metabolic panel Check serum salicylates, EtOH, Tylenol levels  Acute respiratory failure in the setting of unresponsiveness Possible aspiration, COVID test is pending Follow intermittent chest x-ray, start Unasyn SBT's when mental status improves  History of gastritis, gastric ulcer PPI  Active smoker. Lung nodule noted on CT abdomen in 2020 but no follow-up Get CT chest when stable  Best practice:  Diet: NPO Pain/Anxiety/Delirium protocol (if indicated): Propofol VAP protocol (if indicated): ordered DVT prophylaxis: Hep SQ GI prophylaxis: PPI Glucose control: Monitor Mobility: Bed Code Status: Full Family Communication: Father updated. He is on the way in. Disposition: ICU  Labs   CBC: Recent Labs  Lab 10/31/20 0602  WBC 16.9*  NEUTROABS 12.0*  HGB 11.1*  HCT 37.2*  MCV 77.3*  PLT 359    Basic Metabolic Panel: No results for input(s): NA, K, CL, CO2, GLUCOSE, BUN, CREATININE, CALCIUM, MG, PHOS in the last 168 hours. GFR: CrCl cannot be calculated (Patient's most recent lab result is older than the maximum 21 days allowed.). Recent Labs  Lab 10/31/20 0602  WBC 16.9*    Liver Function Tests: No results for input(s): AST, ALT, ALKPHOS, BILITOT, PROT, ALBUMIN in the last 168 hours. No results for input(s): LIPASE, AMYLASE in the last 168 hours. No results for input(s): AMMONIA in the last 168 hours.  ABG    Component Value Date/Time   PHART 7.354 10/31/2020 0602   PCO2ART 49.0 (H) 10/31/2020 0602   PO2ART 190 (H) 10/31/2020 0602   HCO3 26.9 10/31/2020 0602   TCO2 28 11/23/2011 2146   O2SAT 98.7 10/31/2020 0605     Coagulation Profile: No results for  input(s): INR, PROTIME in the last 168 hours.  Cardiac Enzymes: No results for input(s): CKTOTAL, CKMB, CKMBINDEX, TROPONINI in the last 168 hours.  HbA1C: No results found for: HGBA1C  CBG: Recent Labs  Lab 10/31/20 0553  GLUCAP 74    Review of Systems:   Unable to obtain due to unresponsiveness  Past Medical History  He,  has a past medical history of Dental caries, Eczema, Gastritis, Hepatitis C, and Shoulder dislocation.   Surgical History    Past Surgical History:  Procedure Laterality Date  . BIOPSY  07/18/2019   Procedure: BIOPSY;  Surgeon: Charlott Rakes, MD;  Location: [facility];  Service: Endoscopy;;  . ESOPHAGOGASTRODUODENOSCOPY (EGD) WITH PROPOFOL N/A 07/18/2019   Procedure: ESOPHAGOGASTRODUODENOSCOPY (EGD) WITH PROPOFOL;  Surgeon: Charlott Rakes, MD;  Location: [facility];  Service: Endoscopy;  Laterality: N/A;     Social History   reports that he has been smoking cigarettes. He has a 45.00 pack-year smoking history. He quit smokeless tobacco use about 10 years ago. He reports current alcohol use. He reports current drug use. Drug: Marijuana.   Family History   His family history includes Hypertension in his father; Migraines in his sister; Osteoarthritis in his mother; Rheum arthritis in his mother.   Allergies Allergies  Allergen Reactions  . Asa [Aspirin] Nausea And Vomiting     Home Medications  Prior to Admission medications   Medication Sig Start Date End Date Taking? Authorizing Provider  dicyclomine (BENTYL) 20 MG tablet Take 1 tablet (20 mg total) by mouth every 6 (six) hours as needed for spasms. 06/11/20   Raeford Razor, MD  omeprazole (PRILOSEC) 20 MG capsule Take 20 mg by mouth daily.    [provider]  omeprazole (PRILOSEC) 40 MG capsule Take 1 capsule (40 mg total) by mouth daily. Patient not taking: Reported on 06/11/2020 06/09/20   Gailen Shelter, PA  ondansetron (ZOFRAN ODT) 4 MG disintegrating tablet Take 1 tablet (4  mg total) by mouth every 8 (eight) hours as needed for nausea or vomiting. Patient not taking: Reported on 06/11/2020 06/09/20   Gailen Shelter, PA  ondansetron (ZOFRAN ODT) 4 MG disintegrating tablet Take 1 tablet (4 mg total) by mouth every 8 (eight) hours as needed for nausea or vomiting. Patient not taking: Reported on 06/11/2020 06/09/20   Gailen Shelter, PA  ondansetron (ZOFRAN) 4 MG tablet Take 1 tablet (4 mg total) by mouth every 6 (six) hours. 06/11/20   Raeford Razor, MD  potassium chloride SA (KLOR-CON) 20 MEQ tablet Take 1 tablet (20 mEq total) by mouth daily for 4 days. Patient not taking: Reported on 06/11/2020 06/09/20 06/13/20  Gailen Shelter, PA     Critical care time:    The patient is critically ill with multiple organ system failure and requires high complexity decision making for assessment and support, frequent evaluation and titration of therapies, advanced monitoring, review of radiographic  studies and interpretation of complex data.   Critical Care Time devoted to patient care services, exclusive of separately billable procedures, described in this note is 35 minutes.   Chilton Greathouse MD [facility] Please see [email or web address] for pager details.  10/31/2020, 6:45 AM

## 2020-11-01 LAB — CBC
HCT: 33.3 % — ABNORMAL LOW (ref 39.0–52.0)
Hemoglobin: 10.3 g/dL — ABNORMAL LOW (ref 13.0–17.0)
MCH: 23.7 pg — ABNORMAL LOW (ref 26.0–34.0)
MCHC: 30.9 g/dL (ref 30.0–36.0)
MCV: 76.7 fL — ABNORMAL LOW (ref 80.0–100.0)
Platelets: 298 10*3/uL (ref 150–400)
RBC: 4.34 MIL/uL (ref 4.22–5.81)
RDW: 18.8 % — ABNORMAL HIGH (ref 11.5–15.5)
WBC: 10.4 10*3/uL (ref 4.0–10.5)
nRBC: 0 % (ref 0.0–0.2)

## 2020-11-01 LAB — BASIC METABOLIC PANEL
Anion gap: 9 (ref 5–15)
BUN: 15 mg/dL (ref 6–20)
CO2: 23 mmol/L (ref 22–32)
Calcium: 8.4 mg/dL — ABNORMAL LOW (ref 8.9–10.3)
Chloride: 104 mmol/L (ref 98–111)
Creatinine, Ser: 1.08 mg/dL (ref 0.61–1.24)
GFR, Estimated: >60 mL/min (ref >60)
Glucose, Bld: 96 mg/dL (ref 70–99)
Potassium: 3.8 mmol/L (ref 3.5–5.1)
Sodium: 136 mmol/L (ref 135–145)

## 2020-11-01 LAB — LIPASE, BLOOD: Lipase: 23 U/L (ref 11–51)

## 2020-11-01 LAB — URINE CULTURE: Culture: NO GROWTH

## 2020-11-01 LAB — HEPATIC FUNCTION PANEL
ALT: 22 U/L (ref 0–44)
AST: 28 U/L (ref 15–41)
Albumin: 3.7 g/dL (ref 3.5–5.0)
Alkaline Phosphatase: 47 U/L (ref 38–126)
Bilirubin, Direct: 0.2 mg/dL (ref 0.0–0.2)
Indirect Bilirubin: 0.7 mg/dL (ref 0.3–0.9)
Total Bilirubin: 0.9 mg/dL (ref 0.3–1.2)
Total Protein: 7.6 g/dL (ref 6.5–8.1)

## 2020-11-01 LAB — PHOSPHORUS: Phosphorus: 3.9 mg/dL (ref 2.5–4.6)

## 2020-11-01 LAB — TROPONIN I (HIGH SENSITIVITY): Troponin I (High Sensitivity): 4 ng/L (ref <18)

## 2020-11-01 LAB — MAGNESIUM: Magnesium: 2.4 mg/dL (ref 1.7–2.4)

## 2020-11-01 MED ORDER — LIDOCAINE VISCOUS HCL 2 % MT SOLN
15.0000 mL | Freq: Once | OROMUCOSAL | Status: DC
  Filled 2020-11-01: qty 15

## 2020-11-01 MED ORDER — ALUM & MAG HYDROXIDE-SIMETH 200-200-20 MG/5ML PO SUSP
30.0000 mL | Freq: Once | ORAL | Status: AC
  Administered 2020-11-01: 30 mL via ORAL
  Filled 2020-11-01: qty 30

## 2020-11-01 NOTE — Progress Notes (Signed)
PROGRESS NOTE  William Horne RUE:[id number] DOB: May 23, 1976 DOA: 10/31/2020 PCP: Patient, No Pcp Per  HPI/Recap of past 24 hours: 44 yo M PMH polysubstance abuse THC, cocaine, overdosed. Responded initially to Narcan but became more unresponsive in the ED and intubated on 10/31/20, and now extubated on 10/31/20.  Transferred to [facility] service on 11/01/2020.  11/01/20:  Seen and examined in the ICU.  Reports pain in his chest, centrally located, reproducible, and nonradiating.  Also reports epigastric pain.  Due to recent history of cocaine use will obtain 1 level of troponin S.  Lipase also ordered.  Assessment/Plan: Active Problems:   Unresponsive state  Resolved acute respiratory failure in the setting of unresponsiveness, extubated on 10/31/2020. Was intubated on 10/31/2020 and extubated the same day. O2 saturation 100% on room air.  Chest pain, reproducible with palpation Reports pain in his chest, centrally located, reproducible, and nonradiating.  Due to recent history of cocaine use will obtain 1 level of troponin S.  Troponins S drawn on 10/31/2020 was negative x2. Pain control  Epigastric pain, unclear etiology  Obtain LFTs and lipase level GI cocktail x1 dose.  Polysubstance abuse disorder UDS positive for THC and cocaine on 10/31/2020. Counseled on the importance of polysubstance abuse cessation Receptive.  Chronic microcytic anemia, unclear etiology Hemoglobin uptrending 10.3 with MCV of 76 Add on iron studies, follow results  Resolved leukocytosis, likely reactive in the setting of unresponsiveness  Resolved AKI Baseline creatinine appears to be 1 with GFR greater than 60 Resented with creatinine of 1.35 Creatinine downtrending on IV fluid 1.08 Continue IV fluids for now, encourage p.o. intake Avoid nephrotoxins Monitor urine output  Code Status: Full code  Family Communication: None at bedside  Disposition Plan: Likely discharge to home on  11/02/2020 once hemodynamically stable and his symptomatology has resolved.   Consultants:  PCCM  Procedures:  Intubation extubation on 10/31/2020.  Antimicrobials:  Unasyn, will switch to Augmentin on 11/02/2020  DVT prophylaxis: Subcu heparin 3 times daily  Status is: Inpatient    Dispo:  Patient From: Home  Planned Disposition: Home  Expected discharge date: 11/02/20  Medically stable for discharge: No, ongoing management of aspiration pneumonia, chest pain and epigastric pain.         Objective: Vitals:   11/01/20 0600 11/01/20 0700 11/01/20 0800 11/01/20 0900  BP:    (!) 144/88  Pulse: (!) 55 (!) 56 60 (!) 54  Resp: 14 16 18 16   Temp:    98.4 F (36.9 C)  TempSrc:    Oral  SpO2: 100% 100% 100% 100%  Weight:      Height:        Intake/Output Summary (Last 24 hours) at 11/01/2020 1115 Last data filed at 11/01/2020 0600 Gross per 24 hour  Intake 362.39 ml  Output 2600 ml  Net -2237.61 ml   Filed Weights   10/31/20 0457 11/01/20 0500  Weight: 86 kg 82.8 kg    Exam:  . General: 44 y.o. year-old male well developed well nourished in no acute distress.  Alert and oriented x3. . Cardiovascular: Regular rate and rhythm with no rubs or gallops.  No thyromegaly or JVD noted.  Tenderness on palpation on chest anteriorly, centrally. 55 Respiratory: Clear to auscultation with no wheezes or rales. Good inspiratory effort. . Abdomen: Soft tender with palpation epigastric region.  Nondistended with normal bowel sounds x4 quadrants. . Musculoskeletal: No lower extremity edema. 2/4 pulses in all 4 extremities. . Skin: No ulcerative lesions noted  or rashes, . Psychiatry: Mood is appropriate for condition and setting   Data Reviewed: CBC: Recent Labs  Lab 10/31/20 0602 10/31/20 0756 11/01/20 0311  WBC 16.9* 14.2* 10.4  NEUTROABS 12.0*  --   --   HGB 11.1* 10.2* 10.3*  HCT 37.2* 33.8* 33.3*  MCV 77.3* 78.4* 76.7*  PLT 359 262 298   Basic Metabolic  Panel: Recent Labs  Lab 10/31/20 0602 10/31/20 0756 11/01/20 0311  NA 141  --  136  K 3.5  --  3.8  CL 105  --  104  CO2 25  --  23  GLUCOSE 96  --  96  BUN 11  --  15  CREATININE 1.35* 1.26* 1.08  CALCIUM 8.6*  --  8.4*  MG  --   --  2.4  PHOS  --   --  3.9   GFR: Estimated Creatinine Clearance: 103.3 mL/min (by C-G formula based on SCr of 1.08 mg/dL). Liver Function Tests: Recent Labs  Lab 10/31/20 0602  AST 35  ALT 28  ALKPHOS 54  BILITOT 0.4  PROT 7.9  ALBUMIN 4.2   No results for input(s): LIPASE, AMYLASE in the last 168 hours. No results for input(s): AMMONIA in the last 168 hours. Coagulation Profile: No results for input(s): INR, PROTIME in the last 168 hours. Cardiac Enzymes: No results for input(s): CKTOTAL, CKMB, CKMBINDEX, TROPONINI in the last 168 hours. BNP (last 3 results) No results for input(s): PROBNP in the last 8760 hours. HbA1C: No results for input(s): HGBA1C in the last 72 hours. CBG: Recent Labs  Lab 10/31/20 0553  GLUCAP 74   Lipid Profile: No results for input(s): CHOL, HDL, LDLCALC, TRIG, CHOLHDL, LDLDIRECT in the last 72 hours. Thyroid Function Tests: No results for input(s): TSH, T4TOTAL, FREET4, T3FREE, THYROIDAB in the last 72 hours. Anemia Panel: No results for input(s): VITAMINB12, FOLATE, FERRITIN, TIBC, IRON, RETICCTPCT in the last 72 hours. Urine analysis:    Component Value Date/Time   COLORURINE YELLOW 10/31/2020 0756   APPEARANCEUR CLEAR 10/31/2020 0756   LABSPEC 1.012 10/31/2020 0756   PHURINE 6.0 10/31/2020 0756   GLUCOSEU 50 (A) 10/31/2020 0756   HGBUR NEGATIVE 10/31/2020 0756   BILIRUBINUR NEGATIVE 10/31/2020 0756   KETONESUR NEGATIVE 10/31/2020 0756   PROTEINUR 30 (A) 10/31/2020 0756   UROBILINOGEN 1.0 11/23/2011 2221   NITRITE NEGATIVE 10/31/2020 0756   LEUKOCYTESUR NEGATIVE 10/31/2020 0756   Sepsis Labs: @LABRCNTIP (procalcitonin:4,lacticidven:4)  ) Recent Results (from the past 240 hour(s))    Respiratory Panel by RT PCR (Flu A&B, Covid) -     Status: None   Collection Time: 10/31/20  6:02 AM   Specimen: Nasopharyngeal  Result Value Ref Range Status   SARS Coronavirus 2 by RT PCR NEGATIVE NEGATIVE Final    Comment: (NOTE) SARS-CoV-2 target nucleic acids are NOT DETECTED.  The SARS-CoV-2 RNA is generally detectable in upper respiratoy specimens during the acute phase of infection. The lowest concentration of SARS-CoV-2 viral copies this assay can detect is 131 copies/mL. A negative result does not preclude SARS-Cov-2 infection and should not be used as the sole basis for treatment or other patient management decisions. A negative result may occur with  improper specimen collection/handling, submission of specimen other than nasopharyngeal swab, presence of viral mutation(s) within the areas targeted by this assay, and inadequate number of viral copies (<131 copies/mL). A negative result must be combined with clinical observations, patient history, and epidemiological information. The expected result is Negative.  Fact Sheet for  Patients:  https://www.moore.com/  Fact Sheet for Healthcare Providers:  https://www.young.biz/  This test is no t yet approved or cleared by the [place] FDA and  has been authorized for detection and/or diagnosis of SARS-CoV-2 by FDA under an Emergency Use Authorization (EUA). This EUA will remain  in effect (meaning this test can be used) for the duration of the COVID-19 declaration under Section 564(b)(1) of the Act, 21 U.S.C. section 360bbb-3(b)(1), unless the authorization is terminated or revoked sooner.     Influenza A by PCR NEGATIVE NEGATIVE Final   Influenza B by PCR NEGATIVE NEGATIVE Final    Comment: (NOTE) The Xpert Xpress SARS-CoV-2/FLU/RSV assay is intended as an aid in  the diagnosis of influenza from Nasopharyngeal swab specimens and  should not be used as a sole basis for  treatment. Nasal washings and  aspirates are unacceptable for Xpert Xpress SARS-CoV-2/FLU/RSV  testing.  Fact Sheet for Patients: https://www.moore.com/  Fact Sheet for Healthcare Providers: https://www.young.biz/  This test is not yet approved or cleared by the [place] FDA and  has been authorized for detection and/or diagnosis of SARS-CoV-2 by  FDA under an Emergency Use Authorization (EUA). This EUA will remain  in effect (meaning this test can be used) for the duration of the  Covid-19 declaration under Section 564(b)(1) of the Act, 21  U.S.C. section 360bbb-3(b)(1), unless the authorization is  terminated or revoked. Performed at [facility], [address] [place], [place] [address]   Culture, respiratory (tracheal aspirate)     Status: None (Preliminary result)   Collection Time: 10/31/20  7:01 AM   Specimen: Tracheal Aspirate; Respiratory  Result Value Ref Range Status   Specimen Description   Final    TRACHEAL ASPIRATE Performed at [facility], [address] [place], [place] [address]    Special Requests   Final    NONE Performed at [facility], [address], [place], [place] [address]    Gram Stain   Final    RARE WBC PRESENT, PREDOMINANTLY MONONUCLEAR FEW GRAM VARIABLE ROD    Culture   Final    CULTURE REINCUBATED FOR BETTER GROWTH Performed at [facility], [address] [place], [place] [address]    Report Status PENDING  Incomplete  Culture, blood (routine x 2)     Status: None (Preliminary result)   Collection Time: 10/31/20  7:56 AM   Specimen: BLOOD  Result Value Ref Range Status   Specimen Description   Final    BLOOD LEFT WRIST Performed at [facility], [address] [place], [place] [address]    Special Requests   Final    BOTTLES DRAWN AEROBIC AND ANAEROBIC Blood Culture results may not be optimal due to an  inadequate volume of blood received in culture bottles Performed at [facility], [address] [place], [place] [address]    Culture   Final    NO GROWTH 1 DAY Performed at [facility], [address], [place], [place] [address]    Report Status PENDING  Incomplete  Urine culture     Status: None   Collection Time: 10/31/20  7:56 AM   Specimen: Urine, Clean Catch  Result Value Ref Range Status   Specimen Description   Final    URINE, CLEAN CATCH Performed at [facility], [address], [place], [place] [address]    Special Requests   Final  NONE Performed at [facility], [address] [place], [place] [address]    Culture   Final    NO GROWTH Performed at [facility], [address] [place], [place] [address]    Report Status 11/01/2020 FINAL  Final  Culture, blood (routine x 2)     Status: None (Preliminary result)   Collection Time: 10/31/20  8:00 AM   Specimen: BLOOD  Result Value Ref Range Status   Specimen Description   Final    BLOOD RIGHT ANTECUBITAL Performed at [facility], [address] [place], [place] [address]    Special Requests   Final    BOTTLES DRAWN AEROBIC ONLY Blood Culture adequate volume Performed at [facility], [address] [place], [place] [address]    Culture   Final    NO GROWTH 1 DAY Performed at [facility], [address] [place], [place] [address]    Report Status PENDING  Incomplete  MRSA PCR Screening     Status: None   Collection Time: 10/31/20  9:39 AM   Specimen: Nasal Mucosa; Nasopharyngeal  Result Value Ref Range Status   MRSA by PCR NEGATIVE NEGATIVE Final    Comment:        The GeneXpert MRSA Assay (FDA approved for NASAL specimens only), is one component of a comprehensive MRSA colonization surveillance program. It is not intended to diagnose MRSA infection nor to guide or monitor  treatment for MRSA infections. Performed at [facility], [address] [place], [place] [address]       Studies: No results found.  Scheduled Meds: . Chlorhexidine Gluconate Cloth  6 each Topical Daily  . heparin  5,000 Units Subcutaneous Q8H  . pantoprazole (PROTONIX) IV  40 mg Intravenous QHS  . polyethylene glycol  17 g Per Tube Daily    Continuous Infusions: . ampicillin-sulbactam (UNASYN) IV Stopped (11/01/20 0626)  . lactated ringers 75 mL/hr at 10/31/20 0732     LOS: 1 day     Darlin Droparole N Martel Galvan, MD [facility] Pager [phone]  If 7PM-7AM, please contact night-coverage [email or web address] Password [email or web address] 11/01/2020, 11:15 AM

## 2020-11-02 ENCOUNTER — Encounter (HOSPITAL_COMMUNITY): Payer: Self-pay

## 2020-11-02 LAB — CULTURE, RESPIRATORY W GRAM STAIN: Culture: NORMAL

## 2020-11-02 LAB — IRON AND TIBC
Iron: 44 ug/dL — ABNORMAL LOW (ref 45–182)
Saturation Ratios: 11 % — ABNORMAL LOW (ref 17.9–39.5)
TIBC: 385 ug/dL (ref 250–450)
UIBC: 341 ug/dL

## 2020-11-02 LAB — LEGIONELLA PNEUMOPHILA SEROGP 1 UR AG: L. pneumophila Serogp 1 Ur Ag: NEGATIVE

## 2020-11-02 LAB — FERRITIN: Ferritin: 13 ng/mL — ABNORMAL LOW (ref 24–336)

## 2020-11-02 MED ORDER — OMEPRAZOLE 40 MG PO CPDR: 40.0000 mg | DELAYED_RELEASE_CAPSULE | Freq: Every day | ORAL | 0 refills | Status: DC

## 2020-11-02 MED ORDER — FERROUS SULFATE 325 (65 FE) MG PO TABS
325.0000 mg | ORAL_TABLET | Freq: Every day | ORAL | Status: DC
  Administered 2020-11-02: 325 mg via ORAL
  Filled 2020-11-02: qty 1

## 2020-11-02 MED ORDER — AMOXICILLIN-POT CLAVULANATE 875-125 MG PO TABS: 1.0000 | ORAL_TABLET | Freq: Two times a day (BID) | ORAL | 0 refills | Status: AC

## 2020-11-02 MED ORDER — AMOXICILLIN-POT CLAVULANATE 875-125 MG PO TABS
1.0000 | ORAL_TABLET | Freq: Two times a day (BID) | ORAL | Status: DC
  Administered 2020-11-02: 1 via ORAL
  Filled 2020-11-02: qty 1

## 2020-11-02 MED ORDER — FERROUS SULFATE 325 (65 FE) MG PO TABS: 325.0000 mg | ORAL_TABLET | Freq: Every day | ORAL | 0 refills | Status: DC

## 2020-11-02 NOTE — TOC Initial Note (Signed)
Transition of Care [facility]) - Initial/Assessment Note    Patient Details  Name: William Horne MRN: [id number] Date of Birth: Feb 07, 1976  Transition of Care [facility]) CM/SW Contact:    William Helling, LCSW Phone Number: 11/02/2020, 10:46 AM  Clinical Narrative:    William Horne consulted for SA resources. Patient reports that he was being careless and that this was a "wake up call" Patient reports that his mother passed in Aug and he has not been taking care of himself  Or coping with his grief. Patient accepted resources. Patient does not have insurance. LCSW reviewed outpatient and residential resources for uninsured individuals. LCSW provided information for authora care grief support as well. Patient expressed understanding of resources. Patient reports that he lives with his father, but gets a hotel room when he "wants to do his own thing"   PLAN: dc home. SA and grief resources provided.  TOC signing off. No further needs.                 Expected Discharge Plan: Home/Self Care Barriers to Discharge: No Barriers Identified   Patient Goals and CMS Choice Patient states their goals for this hospitalization and ongoing recovery are:: Do better because this was a wake up call   Choice offered to / list presented to : NA  Expected Discharge Plan and Services Expected Discharge Plan: Home/Self Care In-house Referral: NA Discharge Planning Services: NA Post Acute Care Choice: NA Living arrangements for the past 2 months: Single Family Home Expected Discharge Date: 11/02/20               DME Arranged: N/A DME Agency: NA       HH Arranged: NA HH Agency: NA        Prior Living Arrangements/Services Living arrangements for the past 2 months: Single Family Home Lives with:: Parents Patient language and need for interpreter reviewed:: Yes Do you feel safe going back to the place where you live?: Yes      Need for Family Participation in Patient Care: No (Comment) Care giver support  system in place?: Yes (comment)   Criminal Activity/Legal Involvement Pertinent to Current Situation/Hospitalization: No - Comment as needed  Activities of Daily Living Home Assistive Devices/Equipment: None ADL Screening (condition at time of admission) Patient's cognitive ability adequate to safely complete daily activities?: No (patient currently intubated) Is the patient deaf or have difficulty hearing?: No Does the patient have difficulty seeing, even when wearing glasses/contacts?: No Does the patient have difficulty concentrating, remembering, or making decisions?: Yes Patient able to express need for assistance with ADLs?: No (patient currently intubated) Does the patient have difficulty dressing or bathing?: Yes Independently performs ADLs?: No Communication: Dependent (patient currently intubated) Is this a change from baseline?: Change from baseline, expected to last >3 days Dressing (OT): Dependent Is this a change from baseline?: Change from baseline, expected to last >3 days Grooming: Dependent Is this a change from baseline?: Change from baseline, expected to last >3 days Feeding: Dependent Is this a change from baseline?: Change from baseline, expected to last >3 days Bathing: Dependent Is this a change from baseline?: Change from baseline, expected to last >3 days Toileting: Dependent Is this a change from baseline?: Change from baseline, expected to last >3days In/Out Bed: Dependent Is this a change from baseline?: Change from baseline, expected to last >3 days Walks in Home: Dependent Is this a change from baseline?: Change from baseline, expected to last >3 days Does the patient have difficulty  walking or climbing stairs?: Yes Weakness of Legs: Both Weakness of Arms/Hands: Both  Permission Sought/Granted   Permission granted to share information with : No              Emotional Assessment Appearance:: Appears stated age Attitude/Demeanor/Rapport:  Engaged Affect (typically observed): Accepting Orientation: : Oriented to Self, Oriented to Place, Oriented to  Time, Oriented to Situation Alcohol / Substance Use: Alcohol Use, Illicit Drugs Psych Involvement: No (comment)  Admission diagnosis:  Acute respiratory failure (HCC) [J96.00] Respiratory arrest (HCC) [R09.2] Hypoxia [R09.02] Unresponsive state [R41.89] Drug overdose, undetermined intent, initial encounter [T50.904A] Nausea and vomiting, intractability of vomiting not specified, unspecified vomiting type [R11.2] Patient Active Problem List   Diagnosis Date Noted  . Unresponsive state 10/31/2020  . Gastric ulcer, unspecified as acute or chronic, without hemorrhage or perforation 07/17/2019  . Tobacco use 07/17/2019  . Marijuana use 07/17/2019  . Nausea and vomiting 07/17/2019  . Microcytic anemia 07/17/2019   PCP:  Patient, No Pcp Per Pharmacy:   [id number] [place], [place] - [address] AT [facility] [address] [place] [address] Phone: [phone] Fax: [phone]     Social Determinants of Health (SDOH) Interventions    Readmission Risk Interventions Readmission Risk Prevention Plan 11/02/2020  Transportation Screening Complete  Home Care Screening Complete  Some recent data might be hidden

## 2020-11-02 NOTE — Discharge Instructions (Signed)
Accidental Drug Poisoning, Adult Accidental drug poisoning happens when a person accidentally takes too much of a substance, such as a prescription medicine, an over-the-counter medicine, a vitamin, a supplement, or an illegal drug. The effects of drug poisoning can be mild, dangerous, or even deadly. What are the causes? This condition is caused by taking too much of a medicine, illegal drug, or other substance. It often results from:  Lack of knowledge about a substance.  Using more than one substance at the same time.  An error made by the health care provider who prescribed the substance.  An error made by the pharmacist who filled the prescription.  A lapse in memory, such as forgetting that you have already taken a dose of the medicine.  Suddenly using a substance after a long period of not using it. The following substances and medicines are more likely to cause an accidental drug poisoning:  Medicines that treat mental problems (psychotropic medicines).  Pain medicines.  Cocaine.  Heroin.  Multivitamins that contain iron.  Over-the-counter cold and cough medicines. What increases the risk? This condition is more likely to occur in:  Elderly adults. Elderly adults are at risk because they may: ? Be taking many different medicines. ? Have difficulty reading labels. ? Forget when they last took their medicine.  People who use illegal drugs.  People who drink alcohol while using illegal drugs or certain medicines.  People with certain mental health conditions. What are the signs or symptoms? Symptoms of this condition depend on the substance and the amount that was taken. Common symptoms include:  Behavior changes, such as confusion.  Sleepiness.  Weakness.  Slowed breathing.  Nausea and vomiting.  Seizures.  Very large or small eye pupil size. A drug poisoning can cause a very serious condition in which your blood pressure drops to a low level (shock).  Symptoms of shock include:  Cold and clammy skin.  Pale skin.  Blue lips.  Very slow breathing.  Extreme sleepiness.  Severe confusion.  Dizziness or fainting. How is this diagnosed? This condition is diagnosed based on:  Your symptoms. You will be asked about the substances you took and when you took them.  A physical exam. You may also have other tests, including:  Urine tests.  Blood tests.  An electrocardiogram (ECG). How is this treated? This condition may need to be treated right away at the hospital. Treatment may involve:  Getting fluids and electrolytes through an IV.  Having a breathing tube inserted in your airway (endotracheal tube) to help you breathe.  Taking medicines. These may include medicines that: ? Absorb any substance that is in your digestive system. ? Block or reverse the effect of the substance that caused the drug poisoning.  Having your blood filtered through an artificial kidney machine (hemodialysis).  Ongoing counseling and mental health support. This may be provided if you used an illegal drug. Follow these instructions at home: Medicines   Take over-the-counter and prescription medicines only as told by your health care provider.  Before taking a new medicine, ask your health care provider whether the medicine: ? May cause side effects. ? Might react with other medicines.  Keep a list of all the medicines that you take, including over-the-counter medicines, vitamins, supplements, and herbs. Bring this list with you to all of your medical visits. General instructions   Drink enough fluid to keep your urine pale yellow.  If you are working with a counselor or mental health professional, make   sure to follow his or her instructions.  Do not drink alcohol if: ? Your health care provider tells you not to drink. ? You are pregnant, may be pregnant, or are planning to become pregnant.  If you drink alcohol, limit how much you  have: ? 0-1 drink a day for women. ? 0-2 drinks a day for men.  Be aware of how much alcohol is in your drink. In the [place] one drink equals one typical bottle of beer (12 oz), one-half glass of wine (5 oz), or one shot of hard liquor (1 oz).  Keep all follow-up visits as told by your health care provider. This is important. How is this prevented?   Get help if you are struggling with: ? Alcohol or drug use. ? Depression or another mental health problem.  Keep the phone number of your local poison control center near your phone or on your cell phone. The hotline of the American Association of Poison Control Centers is (800) 222-1222.  Store all medicines in safety containers that are out of the reach of children.  Read the drug inserts that come with your medicines.  Create a system for taking your medicine, such as a pillbox, that will help you avoid taking too much of the medicine.  Do not drink alcohol while taking medicines unless your health care provider approves.  Do not use illegal drugs.  Do not take medicines that are not prescribed for you. Contact a health care provider if:  Your symptoms return.  You develop new symptoms or side effects after taking a medicine.  You have questions about possible drug poisoning. Call your local poison control center at (800) 222-1222. Get help right away if:  You think that you or someone else may have taken too much of a substance.  You or someone else is having symptoms of drug poisoning. Summary  Accidental drug poisoning happens when a person accidentally takes too much of a substance, such as a prescription medicine, an over-the-counter medicine, a vitamin, a supplement, or an illegal drug.  The effects of drug poisoning can be mild, dangerous, or even deadly.  This condition is diagnosed based on your symptoms and a physical exam. You will be asked to tell your health care provider which substances you took and when you  took them.  This condition may need to be treated right away at the hospital. This information is not intended to replace advice given to you by your health care provider. Make sure you discuss any questions you have with your health care provider. Document Revised: 11/18/2017 Document Reviewed: 11/07/2017 Elsevier Patient Education  2020 [employer]  

## 2020-11-02 NOTE — Discharge Summary (Addendum)
Discharge Summary  William Horne ZOX:[id number] DOB: 01/01/1976  PCP: Patient, No Pcp Per  Admit date: 10/31/2020 Discharge date: 11/02/2020  Time spent: 35 minutes   Recommendations for Outpatient Follow-up:  1. Follow up with your PCP. 2. Follow up with GI Dr. Bosie ClosSchooler 3. Take your medications as prescribed. 4. Completely abstain from polysubstance abuse.  Discharge Diagnoses:  Active Hospital Problems   Diagnosis Date Noted  . Unresponsive state 10/31/2020    Resolved Hospital Problems  No resolved problems to display.    Discharge Condition: Stable   Diet recommendation: Resume previous diet   Vitals:   11/02/20 1000 11/02/20 1100  BP:  (!) 115/57  Pulse: (!) 50 (!) 53  Resp: 19 13  Temp:    SpO2: 100% 100%    History of present illness:  44 yo M PMH gastritis, non bleeding gastric ulcers from EGD done on 07/18/19, THC/cocaine/alcohol use, who [facility] after concern for minimal responsiveness at home.  He reports that this type of event has never happened in the past.  No prior history of syncope or loss of consciousness.  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  The weed was coming from a source he did not know too well.  He does not think he was given anything intentionally to harm him but he is not entirely sure if it was laced with an unknown substance.  All he could remember from that event is that his girlfriend was trying to arouse him and EMS was taking him into the ambulance.   In the ED, upon arrival, he responded initially to Narcan but later became more unresponsive in the ED.  Subsequently, he was intubated on 10/31/20, started on Unasyn for aspiration pneumonia (POA), and admitted to the ICU under PCCM service.  He was extubated on the same day.  He was on room air on 11/01/20 with O2 saturation in the high 90's. [facility].  On 11/01/20 he  reported pain in his chest centrally, sharp, reproducible with palpation, and non radiating.  Troponin S was negative with no evidence of acute ischemia on the telemetry.  On 11/02/20 while in bed he was found to have transient sinus bradycardia which resolved about an hour later when he was more awake.  Sinus rhythm rate 76 noted on the monitor while the writer was in the room.  Denies any chest pain at the time of this visit.  11/02/20:  No new complaints.  Offered to assist with sobriety by providing resources that are available to us.  He stated that he is done using THC and cocaine.  His girlfriend stays with him and she does not use these substances.  He states that she will be able to support him with polysubstance cessation.  Hospital Course:  Active Problems:   Unresponsive state  Resolved acute respiratory failure in the setting of unresponsiveness, intubated and extubated on 10/31/2020. Was intubated on 10/31/2020 and extubated the same day. Currently, O2 saturation 100% on room air.  Presumed aspiration pneumonia in the context of unresponsiveness Received IV unasyn from 10/31/20 through 11/02/20 Switched to Augmentin on 11/02/20 BID x 7 days. WBC 10.4 from 14.2K Afebrile  Resolved Chest pain, reproducible with palpation On 11/01/20 reported pain in his chest, sharp, centrally located, reproducible, and nonradiating.   Due to recent history of cocaine use 1 level of troponin S  was ordered and it was negative. No evidence of acute ischemia on telemetry. Troponins S drawn on 10/31/2020 were negative x2.  History of non bleeding gastric ulcer/gastritis Post EGD on 07/18/19 by Dr. Bosie Clos: Diagnosis 1. 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 Stomach, biopsy, lesser curvature - GASTRIC ANTRAL MUCOSA WITH ACTIVE CHRONIC  GASTRITIS - NEGATIVE FOR INTESTINAL METAPLASIA OR DYSPLASIA - IMMUNOHISTOCHEMICAL STAIN IS NEGATIVE FOR HELICOBACTER PYLORI Continue home omeprazole daily Follow up with GI No new complaints today  Iron deficiency anemia/Microcytic anemia, chronic in the setting of above Iron studies consistent with iron deficiency Started Ferrous sulfate 325 mg daily Hg currently stable 10.3 MCV 76 from 10.2 MCV 78. Follow up with GI   Polysubstance abuse disorder UDS positive for THC and cocaine on 10/31/2020. Counseled on the importance of polysubstance abuse cessation Receptive. TOC consulted to provide resources to assist with sobriety.  Resolved AKI likely prerenal in the setting of dehydration Baseline creatinine appears to be 1 with GFR greater than 60 Resented with creatinine of 1.35 Creatinine downtrending with IV fluid 1.08 Avoid nephrotoxins and dehydration Last UO 1.2L recorded in the last 24 hours.  Resolved transient asymptomatic sinus bradycardia HR 56-66 K+ 3.8, Mg2+ 2.4 Asymptomatic Follow up with your PCP    Code Status: Full code   Consultants:  PCCM  TOC  Procedures:  Intubation and extubation on 10/31/2020.  Antimicrobials:  Unasyn from 10/31/20-11/02/20, switched to Augmentin on 11/02/2020 x 7 days for aspiration pneumonia.     Discharge Exam: BP (!) 115/57   Pulse (!) 53   Temp 98.2 F (36.8 C) (Oral)   Resp 13   Ht 6\' 4"  (1.93 m)   Wt 82.6 kg   SpO2 100%   BMI 22.17 kg/m  . General: 44 y.o. year-old male well developed well nourished in no acute distress.  Alert and oriented x3. . Cardiovascular: Regular rate and rhythm with no rubs or gallops.  No thyromegaly or JVD noted.   55 Respiratory: Clear to auscultation with no wheezes or rales. Good inspiratory effort. . Abdomen: Soft nontender nondistended with normal bowel sounds x4 quadrants. . Musculoskeletal: No lower extremity edema. 2/4 pulses in all 4 extremities. Marland Kitchen Psychiatry: Mood  is appropriate for condition and setting  Discharge Instructions You were cared for by a hospitalist during your hospital stay. If you have any questions about your discharge medications or the care you received while you were in the hospital after you are discharged, you can call the unit and asked to speak with the hospitalist on call if the hospitalist that took care of you is not available. Once you are discharged, your primary care physician will handle any further medical issues. Please note that NO REFILLS for any discharge medications will be authorized once you are discharged, as it is imperative that you return to your primary care physician (or establish a relationship with a primary care physician if you do not have one) for your aftercare needs so that they can reassess your need for medications and monitor your lab values.   Allergies as of 11/02/2020      Reactions   Asa [aspirin] Nausea And Vomiting      Medication List    STOP taking these medications   dicyclomine 20 MG tablet Commonly known as: BENTYL   ondansetron 4 MG disintegrating tablet Commonly known as: Zofran ODT  ondansetron 4 MG tablet Commonly known as: ZOFRAN   potassium chloride SA 20 MEQ tablet Commonly known as: KLOR-CON     TAKE these medications   amoxicillin-clavulanate 875-125 MG tablet Commonly known as: AUGMENTIN Take 1 tablet by mouth 2 (two) times daily for 7 days.   ferrous sulfate 325 (65 FE) MG tablet Take 1 tablet (325 mg total) by mouth daily with breakfast.   omeprazole 40 MG capsule Commonly known as: PRILOSEC Take 1 capsule (40 mg total) by mouth daily.      Allergies  Allergen Reactions  . Asa [Aspirin] Nausea And Vomiting    Follow-up Information    [facility]. Call in 1 day(s).   Why: Please call for a post hospital follow up appointment Contact information: [address] [place] [address] [place] [address] [phone]        Charlott Rakes, MD. Call in 1 day(s).   Specialty: Gastroenterology Why: Please call for a post hospital follow up appointment. Contact information: [address] [place] [address] [phone]                The results of significant diagnostics from this hospitalization (including imaging, microbiology, ancillary and laboratory) are listed below for reference.    Significant Diagnostic Studies: CT Head Wo Contrast  Result Date: 10/31/2020 CLINICAL DATA:  Altered mental status, delirium EXAM: CT HEAD WITHOUT CONTRAST TECHNIQUE: Contiguous axial images were obtained from the base of the skull through the vertex without intravenous contrast. COMPARISON:  None. FINDINGS: Clinical note: Examination is significantly degraded by motion artifact. Examination was repeated two times in an effort to mitigate artifact. Brain: No evidence of intracranial hemorrhage, extra-axial collection, hydrocephalus, or large territory acute infarction. Vascular: No hyperdense vessel or unexpected calcification. Skull: No evidence of acute calvarial fracture. Sinuses/Orbits: No acute finding. Other: None. IMPRESSION: Examination is significantly degraded by motion artifact. Within this limitation, no evidence of an acute intracranial abnormality. Electronically Signed   By: Duanne Guess D.O.   On: 10/31/2020 07:19   DG CHEST PORT 1 VIEW  Result Date: 10/31/2020 CLINICAL DATA:  Respiratory failure EXAM: PORTABLE CHEST 1 VIEW COMPARISON:  10/31/2020 at 0624 hours FINDINGS: ET tube terminates 5.5 cm above the carina. Enteric tube courses below the diaphragm with side port located below the level of the GE junction. Normal heart size. Persistent hazy right perihilar and right basilar opacity. Left lung is clear. No pleural effusion or pneumothorax. IMPRESSION: 1. Persistent hazy right perihilar and right basilar opacity. 2. Support apparatus, as above. Electronically Signed   By:  Duanne Guess D.O.   On: 10/31/2020 08:02   DG Chest Port 1 View  Result Date: 10/31/2020 CLINICAL DATA:  Post intubation. EXAM: PORTABLE CHEST 1 VIEW COMPARISON:  Earlier film, same date. FINDINGS: The cardiac silhouette, mediastinal and hilar contours are within normal limits and stable. The endotracheal tube and NG tubes are in good position, unchanged. Persistent hazy perihilar lung opacity could reflect noncardiogenic pulmonary edema or possible perihilar infiltrates. No pleural effusions. No pneumothorax. IMPRESSION: 1. Stable support apparatus. 2. Persistent hazy perihilar lung opacity, noncardiogenic pulmonary edema versus perihilar infiltrates. Electronically Signed   By: Rudie Meyer M.D.   On: 10/31/2020 06:37   DG Chest Portable 1 View  Result Date: 10/31/2020 CLINICAL DATA:  Intubation EXAM: PORTABLE CHEST 1 VIEW COMPARISON:  Earlier today FINDINGS: New endotracheal tube with tip just below the clavicular heads. The enteric tube and side-port reaches the  stomach. Hazy perihilar opacity. No Kerley lines or effusion. No visible pneumothorax. The apices are excluded from view. IMPRESSION: 1. Unremarkable hardware. 2. Hazy perihilar opacity which could be aspiration, edema, or pneumonia. Electronically Signed   By: Marnee Spring M.D.   On: 10/31/2020 06:24   DG Chest Port 1 View  Result Date: 10/31/2020 CLINICAL DATA:  Shortness of breath. EXAM: PORTABLE CHEST 1 VIEW COMPARISON:  11/20/2011 FINDINGS: The cardiac silhouette, mediastinal and hilar contours are within normal limits. The lungs demonstrate hyperinflation and probable mild underlying emphysematous changes. Right lower lobe airspace process suspicious for pneumonia. No pleural effusions. The bony thorax is intact. IMPRESSION: Right lower lobe infiltrate. Electronically Signed   By: Rudie Meyer M.D.   On: 10/31/2020 06:12    Microbiology: Recent Results (from the past 240 hour(s))  Respiratory Panel by RT PCR (Flu A&B,  Covid) -     Status: None   Collection Time: 10/31/20  6:02 AM   Specimen: Nasopharyngeal  Result Value Ref Range Status   SARS Coronavirus 2 by RT PCR NEGATIVE NEGATIVE Final    Comment: (NOTE) SARS-CoV-2 target nucleic acids are NOT DETECTED.  The SARS-CoV-2 RNA is generally detectable in upper respiratoy specimens during the acute phase of infection. The lowest concentration of SARS-CoV-2 viral copies this assay can detect is 131 copies/mL. A negative result does not preclude SARS-Cov-2 infection and should not be used as the sole basis for treatment or other patient management decisions. A negative result may occur with  improper specimen collection/handling, submission of specimen other than nasopharyngeal swab, presence of viral mutation(s) within the areas targeted by this assay, and inadequate number of viral copies (<131 copies/mL). A negative result must be combined with clinical observations, patient history, and epidemiological information. The expected result is Negative.  Fact Sheet for Patients:  https://www.moore.com/  Fact Sheet for Healthcare Providers:  https://www.young.biz/  This test is no t yet approved or cleared by the [place] FDA and  has been authorized for detection and/or diagnosis of SARS-CoV-2 by FDA under an Emergency Use Authorization (EUA). This EUA will remain  in effect (meaning this test can be used) for the duration of the COVID-19 declaration under Section 564(b)(1) of the Act, 21 U.S.C. section 360bbb-3(b)(1), unless the authorization is terminated or revoked sooner.     Influenza A by PCR NEGATIVE NEGATIVE Final   Influenza B by PCR NEGATIVE NEGATIVE Final    Comment: (NOTE) The Xpert Xpress SARS-CoV-2/FLU/RSV assay is intended as an aid in  the diagnosis of influenza from Nasopharyngeal swab specimens and  should not be used as a sole basis for treatment. Nasal washings and  aspirates are  unacceptable for Xpert Xpress SARS-CoV-2/FLU/RSV  testing.  Fact Sheet for Patients: https://www.moore.com/  Fact Sheet for Healthcare Providers: https://www.young.biz/  This test is not yet approved or cleared by the [place] FDA and  has been authorized for detection and/or diagnosis of SARS-CoV-2 by  FDA under an Emergency Use Authorization (EUA). This EUA will remain  in effect (meaning this test can be used) for the duration of the  Covid-19 declaration under Section 564(b)(1) of the Act, 21  U.S.C. section 360bbb-3(b)(1), unless the authorization is  terminated or revoked. Performed at [facility], [address], [place], [place] [address]   Culture, respiratory (tracheal aspirate)     Status: None   Collection Time: 10/31/20  7:01 AM   Specimen: Tracheal Aspirate; Respiratory  Result Value Ref Range Status  Specimen Description   Final    TRACHEAL ASPIRATE Performed at [facility], [address], [place], [place] [address]    Special Requests   Final    NONE Performed at [facility], [address], [place], [place] [address]    Gram Stain   Final    RARE WBC PRESENT, PREDOMINANTLY MONONUCLEAR FEW GRAM VARIABLE ROD    Culture   Final    FEW Normal respiratory flora-no Staph aureus or Pseudomonas seen Performed at [facility], [address] [place], [place] [address]    Report Status 11/02/2020 FINAL  Final  Culture, blood (routine x 2)     Status: None (Preliminary result)   Collection Time: 10/31/20  7:56 AM   Specimen: BLOOD  Result Value Ref Range Status   Specimen Description   Final    BLOOD LEFT WRIST Performed at [facility], [address] [place], [place] [address]    Special Requests   Final    BOTTLES DRAWN AEROBIC AND ANAEROBIC Blood Culture results may not be optimal due to an inadequate volume of blood received in  culture bottles Performed at [facility], [address], [place], [place] [address]    Culture   Final    NO GROWTH 2 DAYS Performed at [facility], [address] [place], [place] [address]    Report Status PENDING  Incomplete  Urine culture     Status: None   Collection Time: 10/31/20  7:56 AM   Specimen: Urine, Clean Catch  Result Value Ref Range Status   Specimen Description   Final    URINE, CLEAN CATCH Performed at [facility], [address] [place], [place] [address]    Special Requests   Final    NONE Performed at [facility], [address] [place], [place] [address]    Culture   Final    NO GROWTH Performed at [facility], [address], [place], [place] [address]    Report Status 11/01/2020 FINAL  Final  Culture, blood (routine x 2)     Status: None (Preliminary result)   Collection Time: 10/31/20  8:00 AM   Specimen: BLOOD  Result Value Ref Range Status   Specimen Description   Final    BLOOD RIGHT ANTECUBITAL Performed at [facility], [address], [place], [place] [address]    Special Requests   Final    BOTTLES DRAWN AEROBIC ONLY Blood Culture adequate volume Performed at [facility], [address], [place], [place] [address]    Culture   Final    NO GROWTH 2 DAYS Performed at [facility], [address] [place], [place] [address]    Report Status PENDING  Incomplete  MRSA PCR Screening     Status: None   Collection Time: 10/31/20  9:39 AM   Specimen: Nasal Mucosa; Nasopharyngeal  Result Value Ref Range Status   MRSA by PCR NEGATIVE NEGATIVE Final    Comment:        The GeneXpert MRSA Assay (FDA approved for NASAL specimens only), is one component of a comprehensive MRSA colonization surveillance program. It is not intended to diagnose MRSA infection nor to guide or monitor treatment for MRSA infections. Performed at  [facility], [address], [place], [place] [address]      Labs: Basic Metabolic Panel: Recent Labs  Lab 10/31/20 0602 10/31/20 0756 11/01/20 0311  NA 141  --  136  K 3.5  --  3.8  CL 105  --  104  CO2 25  --  23  GLUCOSE 96  --  96  BUN 11  --  15  CREATININE 1.35* 1.26* 1.08  CALCIUM 8.6*  --  8.4*  MG  --   --  2.4  PHOS  --   --  3.9   Liver Function Tests: Recent Labs  Lab 10/31/20 0602 11/01/20 1148  AST 35 28  ALT 28 22  ALKPHOS 54 47  BILITOT 0.4 0.9  PROT 7.9 7.6  ALBUMIN 4.2 3.7   Recent Labs  Lab 11/01/20 1148  LIPASE 23   No results for input(s): AMMONIA in the last 168 hours. CBC: Recent Labs  Lab 10/31/20 0602 10/31/20 0756 11/01/20 0311  WBC 16.9* 14.2* 10.4  NEUTROABS 12.0*  --   --   HGB 11.1* 10.2* 10.3*  HCT 37.2* 33.8* 33.3*  MCV 77.3* 78.4* 76.7*  PLT 359 262 298   Cardiac Enzymes: No results for input(s): CKTOTAL, CKMB, CKMBINDEX, TROPONINI in the last 168 hours. BNP: BNP (last 3 results) No results for input(s): BNP in the last 8760 hours.  ProBNP (last 3 results) No results for input(s): PROBNP in the last 8760 hours.  CBG: Recent Labs  Lab 10/31/20 0553  GLUCAP 74       Signed:  Darlin Drop, MD [facility] 11/02/2020, 11:40 AM

## 2020-11-05 LAB — CULTURE, BLOOD (ROUTINE X 2)
Culture: NO GROWTH
Culture: NO GROWTH
Special Requests: ADEQUATE

## 2020-11-11 LAB — PANEL 799049
CARBOXY THC GC/MS CONF: 290 ng/mL
Cannabinoid GC/MS, Ur: POSITIVE — AB

## 2020-11-11 LAB — URINE DRUGS OF ABUSE SCREEN W ALC, ROUTINE (REF LAB)
Amphetamines, Urine: NEGATIVE ng/mL
Barbiturate, Ur: NEGATIVE ng/mL
Benzodiazepine Quant, Ur: NEGATIVE ng/mL
Ethanol U, Quan: NEGATIVE %
Methadone Screen, Urine: NEGATIVE ng/mL
Opiate Quant, Ur: NEGATIVE ng/mL
Phencyclidine, Ur: NEGATIVE ng/mL
Propoxyphene, Urine: NEGATIVE ng/mL

## 2020-11-11 LAB — COCAINE CONF, UR
Benzoylecgonine GC/MS Conf: 544 ng/mL
Cocaine Metab Quant, Ur: POSITIVE — AB

## 2021-07-04 IMAGING — DX DG CHEST 1V PORT
1 series · 1 of 1 positions shown · non-contrast
Comparison: 10/31/2020 at 4176 hours

CLINICAL DATA: Respiratory failure

EXAM:
PORTABLE CHEST 1 VIEW

[chest ap]
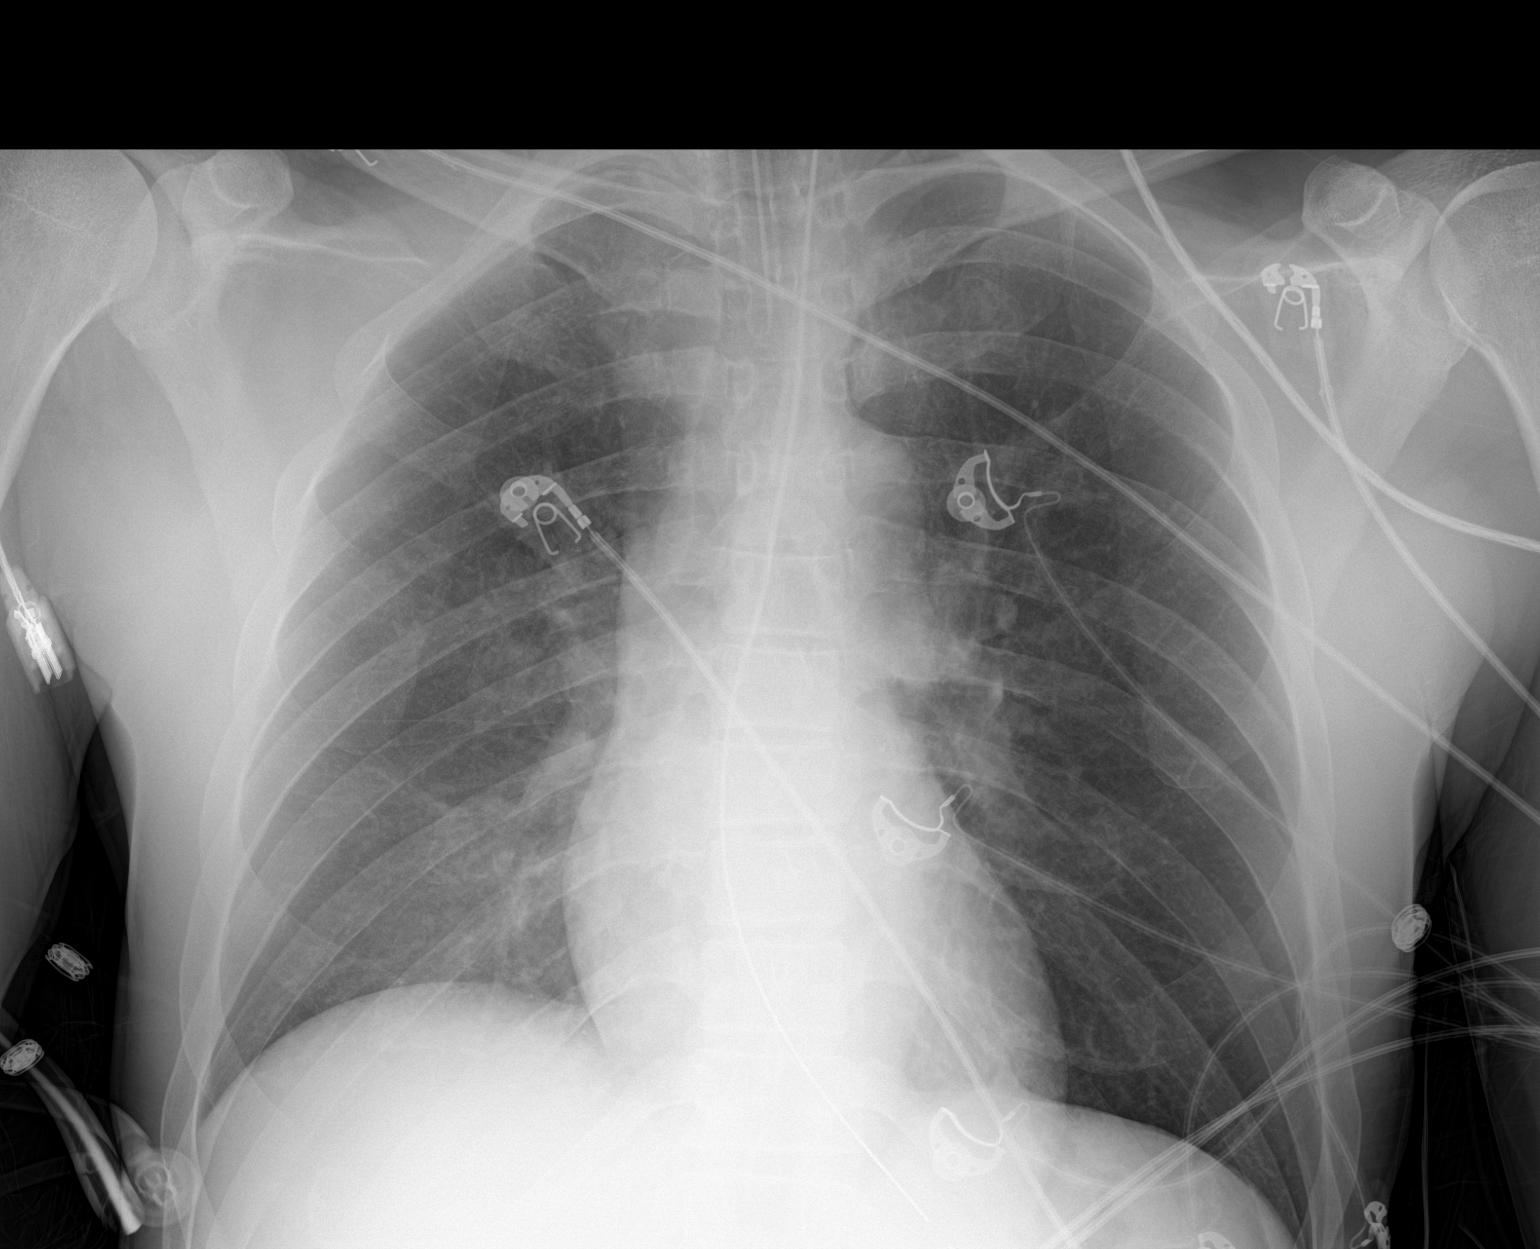

[1 of 1 positions shown; findings below may reference images not displayed]

FINDINGS: ET tube terminates 5.5 cm above the carina. Enteric tube courses
below the diaphragm with side port located below the level of the GE
junction. Normal heart size. Persistent hazy right perihilar and
right basilar opacity. Left lung is clear. No pleural effusion or
pneumothorax.
IMPRESSION: 1. Persistent hazy right perihilar and right basilar opacity.
2. Support apparatus, as above.

## 2022-07-01 ENCOUNTER — Emergency Department (HOSPITAL_COMMUNITY): Payer: Self-pay

## 2022-07-01 ENCOUNTER — Encounter (HOSPITAL_COMMUNITY): Payer: Self-pay | Admitting: Emergency Medicine

## 2022-07-01 ENCOUNTER — Emergency Department (HOSPITAL_COMMUNITY)
Admission: EM | Admit: 2022-07-01 | Discharge: 2022-07-02 | Disposition: A | Discharge status: Home / Self Care | Payer: Self-pay | Attending: Emergency Medicine | Admitting: Emergency Medicine

## 2022-07-01 ENCOUNTER — Other Ambulatory Visit: Payer: Self-pay

## 2022-07-01 DIAGNOSIS — R112 Nausea with vomiting, unspecified: Secondary | ICD-10-CM | POA: Insufficient documentation

## 2022-07-01 DIAGNOSIS — R1013 Epigastric pain: Secondary | ICD-10-CM | POA: Insufficient documentation

## 2022-07-01 DIAGNOSIS — E860 Dehydration: Secondary | ICD-10-CM | POA: Insufficient documentation

## 2022-07-01 LAB — CBC WITH DIFFERENTIAL/PLATELET
Abs Immature Granulocytes: 0.03 10*3/uL (ref 0.00–0.07)
Basophils Absolute: 0.1 10*3/uL (ref 0.0–0.1)
Basophils Relative: 1 %
Eosinophils Absolute: 0.1 10*3/uL (ref 0.0–0.5)
Eosinophils Relative: 1 %
HCT: 37.2 % — ABNORMAL LOW (ref 39.0–52.0)
Hemoglobin: 11.1 g/dL — ABNORMAL LOW (ref 13.0–17.0)
Immature Granulocytes: 0 %
Lymphocytes Relative: 26 %
Lymphs Abs: 2.4 10*3/uL (ref 0.7–4.0)
MCH: 20.6 pg — ABNORMAL LOW (ref 26.0–34.0)
MCHC: 29.8 g/dL — ABNORMAL LOW (ref 30.0–36.0)
MCV: 69 fL — ABNORMAL LOW (ref 80.0–100.0)
Monocytes Absolute: 0.9 10*3/uL (ref 0.1–1.0)
Monocytes Relative: 10 %
Neutro Abs: 5.6 10*3/uL (ref 1.7–7.7)
Neutrophils Relative %: 62 %
Platelets: 506 10*3/uL — ABNORMAL HIGH (ref 150–400)
RBC: 5.39 MIL/uL (ref 4.22–5.81)
RDW: 21 % — ABNORMAL HIGH (ref 11.5–15.5)
WBC: 9.1 10*3/uL (ref 4.0–10.5)
nRBC: 0 % (ref 0.0–0.2)

## 2022-07-01 LAB — COMPREHENSIVE METABOLIC PANEL
ALT: 13 U/L (ref 0–44)
AST: 18 U/L (ref 15–41)
Albumin: 4.1 g/dL (ref 3.5–5.0)
Alkaline Phosphatase: 49 U/L (ref 38–126)
Anion gap: 8 (ref 5–15)
BUN: 16 mg/dL (ref 6–20)
CO2: 28 mmol/L (ref 22–32)
Calcium: 9.2 mg/dL (ref 8.9–10.3)
Chloride: 101 mmol/L (ref 98–111)
Creatinine, Ser: 1.24 mg/dL (ref 0.61–1.24)
GFR, Estimated: >60 mL/min (ref >60)
Glucose, Bld: 107 mg/dL — ABNORMAL HIGH (ref 70–99)
Potassium: 3.9 mmol/L (ref 3.5–5.1)
Sodium: 137 mmol/L (ref 135–145)
Total Bilirubin: 0.7 mg/dL (ref 0.3–1.2)
Total Protein: 8.1 g/dL (ref 6.5–8.1)

## 2022-07-01 LAB — LIPASE, BLOOD: Lipase: 24 U/L (ref 11–51)

## 2022-07-01 MED ORDER — MORPHINE SULFATE (PF) 4 MG/ML IV SOLN
4.0000 mg | Freq: Once | INTRAVENOUS | Status: AC
  Administered 2022-07-01: 4 mg via INTRAVENOUS
  Filled 2022-07-01: qty 1

## 2022-07-01 MED ORDER — SODIUM CHLORIDE 0.9 % IV BOLUS
1000.0000 mL | Freq: Once | INTRAVENOUS | Status: AC
  Administered 2022-07-01: 1000 mL via INTRAVENOUS

## 2022-07-01 MED ORDER — FERROUS SULFATE 325 (65 FE) MG PO TABS: 325.0000 mg | ORAL_TABLET | Freq: Every day | ORAL | 0 refills | Status: DC

## 2022-07-01 MED ORDER — PANTOPRAZOLE SODIUM 40 MG PO TBEC: 40.0000 mg | DELAYED_RELEASE_TABLET | Freq: Every day | ORAL | 0 refills | Status: DC

## 2022-07-01 MED ORDER — ONDANSETRON HCL 4 MG/2ML IJ SOLN
4.0000 mg | Freq: Once | INTRAMUSCULAR | Status: AC
  Administered 2022-07-01: 4 mg via INTRAVENOUS
  Filled 2022-07-01: qty 2

## 2022-07-01 MED ORDER — IOHEXOL 300 MG/ML  SOLN
100.0000 mL | Freq: Once | INTRAMUSCULAR | Status: AC | PRN
  Administered 2022-07-01: 100 mL via INTRAVENOUS

## 2022-07-01 MED ORDER — ONDANSETRON 4 MG PO TBDP: 4.0000 mg | ORAL_TABLET | Freq: Three times a day (TID) | ORAL | 0 refills | Status: DC | PRN

## 2022-07-01 NOTE — ED Triage Notes (Signed)
Patient c/o acid reflux and emesis x 3 days. Having mid upper abdominal pain and c/o soreness to his back muscles. States unable to keep anything down.

## 2022-07-01 NOTE — ED Provider Notes (Addendum)
[facility]-EMERGENCY DEPT Provider Note   CSN: [id number] Arrival date & time: 07/01/22  1505     History  Chief Complaint  Patient presents with   Emesis   Abdominal Pain   Gastroesophageal Reflux    William Horne is a 46 y.o. male.  Pt is a 46 yo with a pmhx significant for hepatitis C and GERD.  Pt has had upper abd pain and n/v for 3 days.  Pt said he is unable to keep anything down.  No f/c.       Home Medications Prior to Admission medications   Medication Sig Start Date End Date Taking? Authorizing Provider  ondansetron (ZOFRAN-ODT) 4 MG disintegrating tablet Take 1 tablet (4 mg total) by mouth every 8 (eight) hours as needed for nausea or vomiting. 07/01/22  Yes Jacalyn Lefevre, MD  pantoprazole (PROTONIX) 40 MG tablet Take 1 tablet (40 mg total) by mouth daily. 07/01/22  Yes Jacalyn Lefevre, MD  ferrous sulfate 325 (65 FE) MG tablet Take 1 tablet (325 mg total) by mouth daily with breakfast. 07/01/22 09/29/22  Jacalyn Lefevre, MD      Allergies    Asa [aspirin]    Review of Systems   Review of Systems  Gastrointestinal:  Positive for abdominal pain, nausea and vomiting.  All other systems reviewed and are negative.   Physical Exam Updated Vital Signs BP 123/76   Pulse (!) 59   Temp 98.3 F (36.8 C) (Oral)   Resp 16   Ht 6\' 4"  (1.93 m)   Wt 82.6 kg   SpO2 100%   BMI 22.15 kg/m  Physical Exam Vitals and nursing note reviewed.  Constitutional:      Appearance: He is well-developed.  HENT:     Head: Normocephalic and atraumatic.     Mouth/Throat:     Mouth: Mucous membranes are dry.     Pharynx: Oropharynx is clear.  Eyes:     Extraocular Movements: Extraocular movements intact.     Pupils: Pupils are equal, round, and reactive to light.  Cardiovascular:     Rate and Rhythm: Normal rate and regular rhythm.  Pulmonary:     Effort: Pulmonary effort is normal.     Breath sounds: Normal breath sounds.  Abdominal:      General: Abdomen is flat. Bowel sounds are normal.     Palpations: Abdomen is soft.     Tenderness: There is abdominal tenderness in the epigastric area.  Skin:    General: Skin is warm.     Capillary Refill: Capillary refill takes less than 2 seconds.  Neurological:     General: No focal deficit present.     Mental Status: He is alert and oriented to person, place, and time.  Psychiatric:        Mood and Affect: Mood normal.        Behavior: Behavior normal.     ED Results / Procedures / Treatments   Labs (all labs ordered are listed, but only abnormal results are displayed) Labs Reviewed  CBC WITH DIFFERENTIAL/PLATELET - Abnormal; Notable for the following components:      Result Value   Hemoglobin 11.1 (*)    HCT 37.2 (*)    MCV 69.0 (*)    MCH 20.6 (*)    MCHC 29.8 (*)    RDW 21.0 (*)    Platelets 506 (*)    All other components within normal limits  COMPREHENSIVE METABOLIC PANEL - Abnormal; Notable for the  following components:   Glucose, Bld 107 (*)    All other components within normal limits  LIPASE, BLOOD  URINALYSIS, ROUTINE W REFLEX MICROSCOPIC    EKG EKG Interpretation  Date/Time:  Thursday July 01 2022 21:47:01 EDT Ventricular Rate:  64 PR Interval:  160 QRS Duration: 93 QT Interval:  424 QTC Calculation: 438 R Axis:   86 Text Interpretation: Sinus rhythm No significant change since last tracing Confirmed by Jacalyn Lefevre [phone]) on 07/01/2022 10:23:58 PM  Radiology CT Abdomen Pelvis W Contrast  Result Date: 07/01/2022 CLINICAL DATA:  Epigastric pain. EXAM: CT ABDOMEN AND PELVIS WITH CONTRAST TECHNIQUE: Multidetector CT imaging of the abdomen and pelvis was performed using the standard protocol following bolus administration of intravenous contrast. RADIATION DOSE REDUCTION: This exam was performed according to the departmental dose-optimization program which includes automated exposure control, adjustment of the mA and/or kV according to patient size  and/or use of iterative reconstruction technique. CONTRAST:  OMNIPAQUE IOHEXOL 300 MG/ML  SOLN COMPARISON:  CT abdomen and pelvis 07/17/2019. FINDINGS: Lower chest: There is a nodular density in the left lower lobe measuring 9 mm. This is unchanged from prior. Hepatobiliary: No focal liver abnormality is seen. No gallstones, gallbladder wall thickening, or biliary dilatation. Pancreas: Unremarkable. No pancreatic ductal dilatation or surrounding inflammatory changes. Spleen: Normal in size without focal abnormality. Adrenals/Urinary Tract: Adrenal glands are unremarkable. Kidneys are normal, without renal calculi, focal lesion, or hydronephrosis. Bladder is unremarkable. Stomach/Bowel: No evidence of bowel wall thickening, distention, or inflammatory changes. The appendix is not seen. There are scattered air-fluid levels throughout small bowel. There is some gastric wall thickening versus normal under distension. Vascular/Lymphatic: Aortic atherosclerosis. No enlarged abdominal or pelvic lymph nodes. Reproductive: Prostate is unremarkable. Other: No abdominal wall hernia or abnormality. No abdominopelvic ascites. Musculoskeletal: Degenerative changes are seen at L5-S1. IMPRESSION: 1. There are scattered air-fluid levels throughout small bowel loops. There is also questionable gastric wall thickening. Findings can be seen in the setting of gastroenteritis. 2. There is no bowel obstruction. 3. Stable 9 mm nodular density in the left lower lobe, unchanged from 07/17/2019. Electronically Signed   By: William Horne M.D.   On: 07/01/2022 19:45    Procedures Procedures    Medications Ordered in ED Medications  iohexol (OMNIPAQUE) 300 MG/ML solution 100 mL (100 mLs Intravenous Contrast Given 07/01/22 1904)  sodium chloride 0.9 % bolus 1,000 mL (1,000 mLs Intravenous New Bag/Given 07/01/22 2208)  morphine (PF) 4 MG/ML injection 4 mg (4 mg Intravenous Given 07/01/22 2207)  ondansetron (ZOFRAN) injection 4 mg (4  mg Intravenous Given 07/01/22 2208)    ED Course/ Medical Decision Making/ A&P                           Medical Decision Making Risk Prescription drug management.   This patient presents to the ED for concern of abd pain, this involves an extensive number of treatment options, and is a complaint that carries with it a high risk of complications and morbidity.  The differential diagnosis includes pancreatitis, gastritis, cholecystitis, uti, pyelo, appendicitis   Co morbidities that complicate the patient evaluation  Hep c and GERD   Additional history obtained:  Additional history obtained from epic chart review   Lab Tests:  I Ordered, and personally interpreted labs.  The pertinent results include:  cbc with hgb 11 (chronic for pt) and plt 506; cmp neg; lip nl   Imaging Studies ordered:  I  ordered imaging studies including ct abd/pelvis  I independently visualized and interpreted imaging which showed  IMPRESSION:  1. There are scattered air-fluid levels throughout small bowel  loops. There is also questionable gastric wall thickening. Findings  can be seen in the setting of gastroenteritis.  2. There is no bowel obstruction.  3. Stable 9 mm nodular density in the left lower lobe, unchanged  from 07/17/2019.   I agree with the radiologist interpretation   Cardiac Monitoring:  The patient was maintained on a cardiac monitor.  I personally viewed and interpreted the cardiac monitored which showed an underlying rhythm of: nsr   Medicines ordered and prescription drug management:  I ordered medication including morphine and zofran and ivfs  for pain, nausea, and dehydration  Reevaluation of the patient after these medicines showed that the patient improved I have reviewed the patients home medicines and have made adjustments as needed   Test Considered:  ct   Critical Interventions:  Pain control    Problem List / ED Course:  Abd pain and n/v:  labs  unremarkable for anything acute.  CT nl.  Pt is still nauseous, so he will be signed out to Dr. Bernette Mayers at shift change.   Reevaluation:  After the interventions noted above, I reevaluated the patient and found that they have :improved   Social Determinants of Health:  Lives at home   Dispostion:  After consideration of the diagnostic results and the patients response to treatment, I feel that the patent would benefit from discharge with outpatient f/u.          Final Clinical Impression(s) / ED Diagnoses Final diagnoses:  Epigastric abdominal pain  Nausea and vomiting, unspecified vomiting type  Dehydration    Rx / DC Orders ED Discharge Orders          Ordered    pantoprazole (PROTONIX) 40 MG tablet  Daily        07/01/22 2306    ferrous sulfate 325 (65 FE) MG tablet  Daily with breakfast        07/01/22 2306    ondansetron (ZOFRAN-ODT) 4 MG disintegrating tablet  Every 8 hours PRN        07/01/22 2306              Jacalyn Lefevre, MD 07/01/22 2226    Jacalyn Lefevre, MD 07/01/22 2306

## 2022-07-01 NOTE — ED Provider Triage Note (Signed)
Emergency Medicine Provider Triage Evaluation Note  William Horne , a 46 y.o. male  was evaluated in triage.  Pt complains of epigastric abdominal pain, persistent nausea vomiting.  Hx of similar.  He denies any EtOH use, chronic NSAID use.  Family did know earlier today some "brown stuff" in his emesis however deny any gross bright red blood or for ground emesis.  He denies any melena in his stools.  No chronic anticoagulation use.  No chest pain, shortness of breath.  No sick contacts.  Review of Systems  Positive: Emesis, epigastric pain Negative: Fever  Physical Exam  BP (!) 116/98   Pulse 61   Temp 98.6 F (37 C) (Oral)   Resp 18   Ht 6\' 4"  (1.93 m)   Wt 82.6 kg   SpO2 99%   BMI 22.15 kg/m  Gen:   Awake, no distress   Resp:  Normal effort  MSK:   Moves extremities without difficulty  Other:    Medical Decision Making  Medically screening exam initiated at 3:37 PM.  Appropriate orders placed.  William Horne was informed that the remainder of the evaluation will be completed by another provider, this initial triage assessment does not replace that evaluation, and the importance of remaining in the ED until their evaluation is complete.  Abd pain, emesis   William Horne A, PA-C 07/01/22 1538

## 2022-07-02 LAB — URINALYSIS, ROUTINE W REFLEX MICROSCOPIC
Bacteria, UA: NONE SEEN
Bilirubin Urine: NEGATIVE
Glucose, UA: NEGATIVE mg/dL
Hgb urine dipstick: NEGATIVE
Ketones, ur: 5 mg/dL — AB
Leukocytes,Ua: NEGATIVE
Nitrite: NEGATIVE
Protein, ur: 30 mg/dL — AB
Specific Gravity, Urine: >1.046 — ABNORMAL HIGH (ref 1.005–1.030)
pH: 7 (ref 5.0–8.0)

## 2022-07-02 NOTE — ED Provider Notes (Signed)
Care of the patient assumed at the change of shift. Here for abdominal pain, blood work and CT neg. Pending IVF, PO fluids and UA.  Physical Exam  BP 115/73   Pulse 66   Temp 98.3 F (36.8 C) (Oral)   Resp 16   Ht 6\' 4"  (1.93 m)   Wt 82.6 kg   SpO2 100%   BMI 22.15 kg/m   Physical Exam  Procedures  Procedures  ED Course / MDM   Clinical Course as of 07/02/22 0203  Fri Jul 02, 2022  0202 UA is clear. Patient tolerating PO fluids. Resting comfortably in no distress. Plan discharge with PCP follow up. Meds changed per primary team.  [CS]    Clinical Course User Index [CS] 0203, MD   Medical Decision Making Risk OTC drugs. Prescription drug management.          Pollyann Savoy, MD 07/02/22 [phone]

## 2023-07-29 ENCOUNTER — Emergency Department (HOSPITAL_COMMUNITY): Payer: Self-pay

## 2023-07-29 ENCOUNTER — Other Ambulatory Visit: Payer: Self-pay

## 2023-07-29 ENCOUNTER — Emergency Department (HOSPITAL_COMMUNITY)
Admission: EM | Admit: 2023-07-29 | Discharge: 2023-07-30 | Disposition: A | Discharge status: Home / Self Care | Payer: Self-pay | Attending: Emergency Medicine | Admitting: Emergency Medicine

## 2023-07-29 DIAGNOSIS — R079 Chest pain, unspecified: Secondary | ICD-10-CM

## 2023-07-29 DIAGNOSIS — R10819 Abdominal tenderness, unspecified site: Secondary | ICD-10-CM | POA: Insufficient documentation

## 2023-07-29 DIAGNOSIS — R112 Nausea with vomiting, unspecified: Secondary | ICD-10-CM

## 2023-07-29 LAB — CBC
HCT: 38.9 % — ABNORMAL LOW (ref 39.0–52.0)
Hemoglobin: 11.8 g/dL — ABNORMAL LOW (ref 13.0–17.0)
MCH: 20.1 pg — ABNORMAL LOW (ref 26.0–34.0)
MCHC: 30.3 g/dL (ref 30.0–36.0)
MCV: 66.4 fL — ABNORMAL LOW (ref 80.0–100.0)
Platelets: 484 10*3/uL — ABNORMAL HIGH (ref 150–400)
RBC: 5.86 MIL/uL — ABNORMAL HIGH (ref 4.22–5.81)
RDW: 21.2 % — ABNORMAL HIGH (ref 11.5–15.5)
WBC: 8.8 10*3/uL (ref 4.0–10.5)
nRBC: 0 % (ref 0.0–0.2)

## 2023-07-29 LAB — COMPREHENSIVE METABOLIC PANEL
ALT: 14 U/L (ref 0–44)
AST: 31 U/L (ref 15–41)
Albumin: 3.8 g/dL (ref 3.5–5.0)
Alkaline Phosphatase: 40 U/L (ref 38–126)
Anion gap: 12 (ref 5–15)
BUN: 27 mg/dL — ABNORMAL HIGH (ref 6–20)
CO2: 27 mmol/L (ref 22–32)
Calcium: 8.2 mg/dL — ABNORMAL LOW (ref 8.9–10.3)
Chloride: 97 mmol/L — ABNORMAL LOW (ref 98–111)
Creatinine, Ser: 1.1 mg/dL (ref 0.61–1.24)
GFR, Estimated: >60 mL/min (ref >60)
Glucose, Bld: 100 mg/dL — ABNORMAL HIGH (ref 70–99)
Potassium: 3 mmol/L — ABNORMAL LOW (ref 3.5–5.1)
Sodium: 136 mmol/L (ref 135–145)
Total Bilirubin: 1.2 mg/dL (ref 0.3–1.2)
Total Protein: 7.6 g/dL (ref 6.5–8.1)

## 2023-07-29 LAB — LIPASE, BLOOD: Lipase: 22 U/L (ref 11–51)

## 2023-07-29 LAB — TROPONIN I (HIGH SENSITIVITY): Troponin I (High Sensitivity): 6 ng/L (ref <18)

## 2023-07-29 LAB — MAGNESIUM: Magnesium: 2.2 mg/dL (ref 1.7–2.4)

## 2023-07-29 MED ORDER — DROPERIDOL 2.5 MG/ML IJ SOLN
2.5000 mg | Freq: Once | INTRAMUSCULAR | Status: AC
  Administered 2023-07-29: 2.5 mg via INTRAVENOUS
  Filled 2023-07-29: qty 2

## 2023-07-29 MED ORDER — ONDANSETRON HCL 4 MG/2ML IJ SOLN
4.0000 mg | Freq: Once | INTRAMUSCULAR | Status: AC
Start: 2023-07-29 — End: 2023-07-29
  Administered 2023-07-29: 4 mg via INTRAVENOUS
  Filled 2023-07-29: qty 2

## 2023-07-29 MED ORDER — CAPSAICIN 0.025 % EX CREA
TOPICAL_CREAM | Freq: Once | CUTANEOUS | Status: AC
  Filled 2023-07-29: qty 60

## 2023-07-29 MED ORDER — POTASSIUM CHLORIDE CRYS ER 20 MEQ PO TBCR
40.0000 meq | EXTENDED_RELEASE_TABLET | Freq: Once | ORAL | Status: AC
  Administered 2023-07-29: 40 meq via ORAL
  Filled 2023-07-29: qty 2

## 2023-07-29 MED ORDER — MORPHINE SULFATE (PF) 4 MG/ML IV SOLN
4.0000 mg | Freq: Once | INTRAVENOUS | Status: AC
  Administered 2023-07-29: 4 mg via INTRAVENOUS
  Filled 2023-07-29: qty 1

## 2023-07-29 MED ORDER — ONDANSETRON HCL 4 MG/2ML IJ SOLN
4.0000 mg | Freq: Once | INTRAMUSCULAR | Status: AC
  Administered 2023-07-29: 4 mg via INTRAVENOUS
  Filled 2023-07-29: qty 2

## 2023-07-29 MED ORDER — SODIUM CHLORIDE 0.9 % IV BOLUS
1000.0000 mL | Freq: Once | INTRAVENOUS | Status: AC
  Administered 2023-07-29: 1000 mL via INTRAVENOUS

## 2023-07-29 NOTE — ED Notes (Signed)
Pt called out at 2016 stating his chest was hurting. Fayrene Helper PA-C notified. New EKG obtained, troponin sent to lab, medicated per [facility]

## 2023-07-29 NOTE — ED Provider Notes (Signed)
[place] EMERGENCY DEPARTMENT AT [facility] Provider Note   CSN: [id number] Arrival date & time: 07/29/23  1249     History  Chief Complaint  Patient presents with   Emesis    Melrose Sandgren is a 47 y.o. male.  The history is provided by the patient, medical records and the EMS personnel. No language interpreter was used.  Emesis    47 yo male presents to the ED w/ hx of PUD and GERD complaining of emesis and nausea x 3 days. Onset of symptoms was abrupt after eating at a relative's house. Of note, patient's father also had a few episodes of emesis after this meal, but resolved shortly after. Patient endorses hematemesis today while en route via EMS to the ED. Was unable to quantify amount of blood but knows it was limited to this single episode. Endorses generalized lower quadrant and flank pain, as well as constipation x 4 days. Denies alcohol consumption but endorses marijuana and tobacco use. Patient was given Zofran 4 mg by EMS which provided some relief, but did not try any medications for symptom relief prior to this. Denies recent travel, any changes to his diet, or ingestion of foreign bodies. Denies chest pain, diarrhea, SOB, fever, chills, myalgias, headaches, sore throat, or cough. No history of previous abdominal surgeries. Takes Protonix for GERD daily. No recent NSAID use.  Home Medications Prior to Admission medications   Medication Sig Start Date End Date Taking? Authorizing Provider  ferrous sulfate 325 (65 FE) MG tablet Take 1 tablet (325 mg total) by mouth daily with breakfast. 07/01/22 09/29/22  Jacalyn Lefevre, MD  ondansetron (ZOFRAN-ODT) 4 MG disintegrating tablet Take 1 tablet (4 mg total) by mouth every 8 (eight) hours as needed for nausea or vomiting. 07/01/22   Jacalyn Lefevre, MD  pantoprazole (PROTONIX) 40 MG tablet Take 1 tablet (40 mg total) by mouth daily. 07/01/22   Jacalyn Lefevre, MD      Allergies    Asa [aspirin]    Review of  Systems   Review of Systems  Gastrointestinal:  Positive for vomiting.  All other systems reviewed and are negative.   Physical Exam Updated Vital Signs BP (!) 120/92 (BP Location: Left Arm)   Pulse 96   Temp 98.5 F (36.9 C) (Oral)   Resp 19   Ht 6\' 4"  (1.93 m)   Wt 79.4 kg   SpO2 98%   BMI 21.30 kg/m  Physical Exam Vitals and nursing note reviewed.  Constitutional:      Appearance: He is well-developed.     Comments: Patient appears to be comfortable but nontoxic  HENT:     Head: Atraumatic.  Eyes:     Conjunctiva/sclera: Conjunctivae normal.  Cardiovascular:     Rate and Rhythm: Normal rate and regular rhythm.     Pulses: Normal pulses.     Heart sounds: Normal heart sounds.  Pulmonary:     Effort: Pulmonary effort is normal.     Breath sounds: Normal breath sounds.  Abdominal:     Palpations: Abdomen is soft.     Tenderness: There is abdominal tenderness (Diffuse abdominal tenderness no focal point tenderness no guarding no rebound tenderness).  Musculoskeletal:     Cervical back: Neck supple.  Skin:    Findings: No rash.  Neurological:     Mental Status: He is alert.     ED Results / Procedures / Treatments   Labs (all labs ordered are listed, but only abnormal results  are displayed) Labs Reviewed  COMPREHENSIVE METABOLIC PANEL - Abnormal; Notable for the following components:      Result Value   Potassium 3.0 (*)    Chloride 97 (*)    Glucose, Bld 100 (*)    BUN 27 (*)    Calcium 8.2 (*)    All other components within normal limits  CBC - Abnormal; Notable for the following components:   RBC 5.86 (*)    Hemoglobin 11.8 (*)    HCT 38.9 (*)    MCV 66.4 (*)    MCH 20.1 (*)    RDW 21.2 (*)    Platelets 484 (*)    All other components within normal limits  LIPASE, BLOOD  MAGNESIUM  URINALYSIS, ROUTINE W REFLEX MICROSCOPIC  RAPID URINE DRUG SCREEN, HOSP PERFORMED  TROPONIN I (HIGH SENSITIVITY)    EKG None ED ECG REPORT   Date: 07/29/2023   Rate: 70  Rhythm: normal sinus rhythm  QRS Axis: normal  Intervals: QT prolonged  ST/T Wave abnormalities: nonspecific T wave changes  Conduction Disutrbances:none  Narrative Interpretation:   Old EKG Reviewed: unchanged  I have personally reviewed the EKG tracing and agree with the computerized printout as noted.  ED ECG REPORT   Date: 07/29/2023  Rate: 65  Rhythm: normal sinus rhythm  QRS Axis: normal  Intervals: normal  ST/T Wave abnormalities: nonspecific T wave changes  Conduction Disutrbances:none  Narrative Interpretation:   Old EKG Reviewed: changes noted  I have personally reviewed the EKG tracing and agree with the computerized printout as noted.   Radiology No results found.  Procedures Procedures    Medications Ordered in ED Medications  potassium chloride SA (KLOR-CON M) CR tablet 40 mEq (has no administration in time range)  ondansetron (ZOFRAN) injection 4 mg (4 mg Intravenous Given 07/29/23 1628)  sodium chloride 0.9 % bolus 1,000 mL (1,000 mLs Intravenous New Bag/Given 07/29/23 1629)  droperidol (INAPSINE) 2.5 MG/ML injection 2.5 mg (2.5 mg Intravenous Given 07/29/23 1628)    ED Course/ Medical Decision Making/ A&P                                 Medical Decision Making Amount and/or Complexity of Data Reviewed Labs: ordered. ECG/medicine tests: ordered.  Risk Prescription drug management.   BP (!) 120/92 (BP Location: Left Arm)   Pulse 96   Temp 98.5 F (36.9 C) (Oral)   Resp 19   Ht 6\' 4"  (1.93 m)   Wt 79.4 kg   SpO2 98%   BMI 21.30 kg/m   4:11 PM  47 yo male presents to the ED w/ hx of PUD and GERD complaining of emesis and nausea x 3 days. Onset of symptoms was abrupt after eating at a relative's house. Of note, patient's father also had a few episodes of emesis after this meal, but resolved shortly after. Patient endorses hematemesis today while en route via EMS to the ED. Was unable to quantify amount of blood but knows it was limited  to this single episode. Endorses generalized lower quadrant and flank pain, as well as constipation x 4 days. Denies alcohol consumption but endorses marijuana and tobacco use. Patient was given Zofran 4 mg by EMS which provided some relief, but did not try any medications for symptom relief prior to this. Denies recent travel, any changes to his diet, or ingestion of foreign bodies. Denies chest pain, diarrhea, SOB, fever, chills, myalgias,  headaches, sore throat, or cough. No history of previous abdominal surgeries. Takes Protonix for GERD daily. No recent NSAID use.  On exam, patient appears uncomfortable, moving about in the room but nontoxic in appearance.  Heart with normal rate and rhythm, lungs are clear to auscultation bilaterally abdomen is soft with generalized tenderness but no focal point tenderness.  Vital sign overall reassuring.  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  Workup initiated.  Will provide supportive care.  -Labs ordered, independently viewed and interpreted by me.  Labs remarkable for K+ 3.0, supplementation given.  Will check mag level.   -The patient was maintained on a cardiac monitor.  I personally viewed and interpreted the cardiac monitored which showed an underlying rhythm of: NSR -Imaging including abd/pelvis CT scan considered but not performed as sxs likely Cannabinoid Hyperemesis Syndrome.  -This patient presents to the ED for concern of nausea and vomiting, this involves an extensive number of treatment options, and is a complaint that carries with it a high risk of complications and morbidity.  The differential diagnosis includes CHS, viral GI, colitis, cholecystitis, appendicitis, pancreatitis -Co morbidities that complicate the patient evaluation includes marijuana use -Treatment includes droperidol, zofran, IVF -Reevaluation of the patient after these medicines  showed that the patient improved -PCP office notes or outside notes reviewed -Discussion with oncoming team who will continue with care -Escalation to admission/observation considered: pending dispo  8:59 PM Patient  endorse having some chest discomfort along with hiccups.  A repeat EKG shows T wave inversion in V1 and V4.  Will check troponin.  Initial EKG does shows prolonged QT however on repeat EKG QTc normalized.  10:14 PM On reassess, patient still having some hiccup and still endorsed nausea.  Initial troponin is negative.  Will continue with symptom control and will need a second troponin.  Patient signed out to oncoming provider.        Final Clinical Impression(s) / ED Diagnoses Final diagnoses:  None    Rx / DC Orders ED Discharge Orders     None         Fayrene Helper, PA-C 07/29/23 2219    Vanetta Mulders, MD 08/01/23 [phone]

## 2023-07-29 NOTE — ED Triage Notes (Signed)
Pt BIB EMS from home c/o n/v and abd pain x 2 days.4mg  Zofran given en route by EMS  18g lac

## 2023-07-30 MED ORDER — ONDANSETRON 4 MG PO TBDP: 4.0000 mg | ORAL_TABLET | Freq: Three times a day (TID) | ORAL | 0 refills | Status: DC | PRN

## 2023-07-30 MED ORDER — FAMOTIDINE 20 MG PO TABS: 20.0000 mg | ORAL_TABLET | Freq: Every day | ORAL | 0 refills | Status: DC

## 2023-07-30 NOTE — ED Provider Notes (Signed)
  Troponin negative x2.  CXR without acute findings.  No further emesis.  Able to tolerate oral K+ pills.  Stable for discharge.  Recommended to stop smoking marijuana.     Garlon Hatchet, PA-C 07/30/23 4098    Glynn Octave, MD 07/30/23 [phone]

## 2023-07-30 NOTE — ED Notes (Signed)
Discharge instructions provided by edp were reinforced to pt. Pt verbalized understanding with no additional questions at this time.  

## 2023-07-30 NOTE — Discharge Instructions (Signed)
We recommend that you stop smoking marijuana. Can continue using zofran and pepcid to help with your symptoms. Return here for new concerns.

## 2023-12-21 ENCOUNTER — Emergency Department (HOSPITAL_COMMUNITY)
Admission: EM | Admit: 2023-12-21 | Discharge: 2023-12-22 | Disposition: A | Discharge status: Home / Self Care | Payer: Self-pay

## 2024-06-12 ENCOUNTER — Emergency Department (HOSPITAL_COMMUNITY)

## 2024-06-12 ENCOUNTER — Other Ambulatory Visit: Payer: Self-pay

## 2024-06-12 ENCOUNTER — Encounter (HOSPITAL_COMMUNITY): Payer: Self-pay

## 2024-06-12 ENCOUNTER — Inpatient Hospital Stay (HOSPITAL_COMMUNITY)
Admission: EM | Admit: 2024-06-12 | Discharge: 2024-06-15 | DRG: 378 | Disposition: A | Discharge status: Home / Self Care | Attending: Internal Medicine | Admitting: Internal Medicine

## 2024-06-12 DIAGNOSIS — K2950 Unspecified chronic gastritis without bleeding: Secondary | ICD-10-CM | POA: Diagnosis present

## 2024-06-12 DIAGNOSIS — D649 Anemia, unspecified: Principal | ICD-10-CM | POA: Diagnosis present

## 2024-06-12 DIAGNOSIS — Z23 Encounter for immunization: Secondary | ICD-10-CM

## 2024-06-12 DIAGNOSIS — B9681 Helicobacter pylori [H. pylori] as the cause of diseases classified elsewhere: Secondary | ICD-10-CM | POA: Diagnosis present

## 2024-06-12 DIAGNOSIS — K922 Gastrointestinal hemorrhage, unspecified: Secondary | ICD-10-CM | POA: Diagnosis not present

## 2024-06-12 DIAGNOSIS — R0789 Other chest pain: Secondary | ICD-10-CM

## 2024-06-12 DIAGNOSIS — K279 Peptic ulcer, site unspecified, unspecified as acute or chronic, without hemorrhage or perforation: Secondary | ICD-10-CM

## 2024-06-12 DIAGNOSIS — E876 Hypokalemia: Secondary | ICD-10-CM | POA: Diagnosis present

## 2024-06-12 DIAGNOSIS — D62 Acute posthemorrhagic anemia: Secondary | ICD-10-CM | POA: Diagnosis present

## 2024-06-12 DIAGNOSIS — Z8619 Personal history of other infectious and parasitic diseases: Secondary | ICD-10-CM

## 2024-06-12 DIAGNOSIS — K254 Chronic or unspecified gastric ulcer with hemorrhage: Principal | ICD-10-CM | POA: Diagnosis present

## 2024-06-12 DIAGNOSIS — Q396 Congenital diverticulum of esophagus: Secondary | ICD-10-CM

## 2024-06-12 DIAGNOSIS — Z79899 Other long term (current) drug therapy: Secondary | ICD-10-CM

## 2024-06-12 DIAGNOSIS — K219 Gastro-esophageal reflux disease without esophagitis: Secondary | ICD-10-CM | POA: Diagnosis present

## 2024-06-12 DIAGNOSIS — Z8249 Family history of ischemic heart disease and other diseases of the circulatory system: Secondary | ICD-10-CM

## 2024-06-12 DIAGNOSIS — Z8261 Family history of arthritis: Secondary | ICD-10-CM

## 2024-06-12 DIAGNOSIS — F17210 Nicotine dependence, cigarettes, uncomplicated: Secondary | ICD-10-CM | POA: Diagnosis present

## 2024-06-12 DIAGNOSIS — Z886 Allergy status to analgesic agent status: Secondary | ICD-10-CM

## 2024-06-12 LAB — CBC WITH DIFFERENTIAL/PLATELET
Abs Immature Granulocytes: 0.02 10*3/uL (ref 0.00–0.07)
Basophils Absolute: 0.1 10*3/uL (ref 0.0–0.1)
Basophils Relative: 1 %
Eosinophils Absolute: 0.1 10*3/uL (ref 0.0–0.5)
Eosinophils Relative: 2 %
HCT: 28.1 % — ABNORMAL LOW (ref 39.0–52.0)
Hemoglobin: 7.3 g/dL — ABNORMAL LOW (ref 13.0–17.0)
Immature Granulocytes: 0 %
Lymphocytes Relative: 26 %
Lymphs Abs: 2.2 10*3/uL (ref 0.7–4.0)
MCH: 15.8 pg — ABNORMAL LOW (ref 26.0–34.0)
MCHC: 26 g/dL — ABNORMAL LOW (ref 30.0–36.0)
MCV: 60.7 fL — ABNORMAL LOW (ref 80.0–100.0)
Monocytes Absolute: 0.8 10*3/uL (ref 0.1–1.0)
Monocytes Relative: 10 %
Neutro Abs: 5.2 10*3/uL (ref 1.7–7.7)
Neutrophils Relative %: 61 %
Platelets: 508 10*3/uL — ABNORMAL HIGH (ref 150–400)
RBC: 4.63 MIL/uL (ref 4.22–5.81)
RDW: 24.4 % — ABNORMAL HIGH (ref 11.5–15.5)
WBC: 8.4 10*3/uL (ref 4.0–10.5)
nRBC: 0 % (ref 0.0–0.2)

## 2024-06-12 LAB — COMPREHENSIVE METABOLIC PANEL WITH GFR
ALT: 14 U/L (ref 0–44)
AST: 16 U/L (ref 15–41)
Albumin: 3.7 g/dL (ref 3.5–5.0)
Alkaline Phosphatase: 56 U/L (ref 38–126)
Anion gap: 9 (ref 5–15)
BUN: 21 mg/dL — ABNORMAL HIGH (ref 6–20)
CO2: 24 mmol/L (ref 22–32)
Calcium: 8.8 mg/dL — ABNORMAL LOW (ref 8.9–10.3)
Chloride: 101 mmol/L (ref 98–111)
Creatinine, Ser: 1.16 mg/dL (ref 0.61–1.24)
GFR, Estimated: >60 mL/min (ref >60)
Glucose, Bld: 101 mg/dL — ABNORMAL HIGH (ref 70–99)
Potassium: 3.4 mmol/L — ABNORMAL LOW (ref 3.5–5.1)
Sodium: 134 mmol/L — ABNORMAL LOW (ref 135–145)
Total Bilirubin: 0.5 mg/dL (ref 0.0–1.2)
Total Protein: 7.5 g/dL (ref 6.5–8.1)

## 2024-06-12 LAB — URINALYSIS, ROUTINE W REFLEX MICROSCOPIC
Bilirubin Urine: NEGATIVE
Glucose, UA: NEGATIVE mg/dL
Hgb urine dipstick: NEGATIVE
Ketones, ur: NEGATIVE mg/dL
Leukocytes,Ua: NEGATIVE
Nitrite: NEGATIVE
Protein, ur: NEGATIVE mg/dL
Specific Gravity, Urine: 1.026 (ref 1.005–1.030)
pH: 5 (ref 5.0–8.0)

## 2024-06-12 LAB — TROPONIN I (HIGH SENSITIVITY)
Troponin I (High Sensitivity): 2 ng/L (ref <18)
Troponin I (High Sensitivity): <2 ng/L (ref <18)

## 2024-06-12 LAB — RETICULOCYTES
Immature Retic Fract: 35.1 % — ABNORMAL HIGH (ref 2.3–15.9)
RBC.: 4.56 MIL/uL (ref 4.22–5.81)
Retic Count, Absolute: 59.7 10*3/uL (ref 19.0–186.0)
Retic Ct Pct: 1.3 % (ref 0.4–3.1)

## 2024-06-12 LAB — FERRITIN: Ferritin: 3 ng/mL — ABNORMAL LOW (ref 24–336)

## 2024-06-12 LAB — IRON AND TIBC
Iron: 15 ug/dL — ABNORMAL LOW (ref 45–182)
Saturation Ratios: 4 % — ABNORMAL LOW (ref 17.9–39.5)
TIBC: 407 ug/dL (ref 250–450)
UIBC: 392 ug/dL

## 2024-06-12 LAB — POC OCCULT BLOOD, ED: Fecal Occult Bld: NEGATIVE

## 2024-06-12 LAB — HIV ANTIBODY (ROUTINE TESTING W REFLEX): HIV Screen 4th Generation wRfx: NONREACTIVE

## 2024-06-12 LAB — FOLATE: Folate: 10.5 ng/mL (ref >5.9)

## 2024-06-12 LAB — LIPASE, BLOOD: Lipase: 37 U/L (ref 11–51)

## 2024-06-12 LAB — VITAMIN B12: Vitamin B-12: 303 pg/mL (ref 180–914)

## 2024-06-12 MED ORDER — ONDANSETRON HCL 4 MG/2ML IJ SOLN: 4.0000 mg | Freq: Four times a day (QID) | INTRAMUSCULAR | Status: DC | PRN

## 2024-06-12 MED ORDER — ACETAMINOPHEN 325 MG PO TABS
650.0000 mg | ORAL_TABLET | Freq: Four times a day (QID) | ORAL | Status: DC | PRN
  Administered 2024-06-12 – 2024-06-14 (×3): 650 mg via ORAL
  Filled 2024-06-12 (×3): qty 2

## 2024-06-12 MED ORDER — MORPHINE SULFATE (PF) 4 MG/ML IV SOLN
4.0000 mg | Freq: Once | INTRAVENOUS | Status: AC
  Administered 2024-06-12: 4 mg via INTRAVENOUS
  Filled 2024-06-12: qty 1

## 2024-06-12 MED ORDER — ALBUTEROL SULFATE (2.5 MG/3ML) 0.083% IN NEBU: 2.5000 mg | INHALATION_SOLUTION | RESPIRATORY_TRACT | Status: DC | PRN

## 2024-06-12 MED ORDER — ALUM & MAG HYDROXIDE-SIMETH 200-200-20 MG/5ML PO SUSP
30.0000 mL | Freq: Once | ORAL | Status: AC
  Administered 2024-06-12: 30 mL via ORAL
  Filled 2024-06-12: qty 30

## 2024-06-12 MED ORDER — BOOST / RESOURCE BREEZE PO LIQD CUSTOM
1.0000 | Freq: Three times a day (TID) | ORAL | Status: DC
  Administered 2024-06-12 – 2024-06-14 (×4): 1 via ORAL
  Administered 2024-06-14: 237 mL via ORAL
  Administered 2024-06-15: 1 via ORAL

## 2024-06-12 MED ORDER — ACETAMINOPHEN 650 MG RE SUPP: 650.0000 mg | Freq: Four times a day (QID) | RECTAL | Status: DC | PRN

## 2024-06-12 MED ORDER — ONDANSETRON HCL 4 MG PO TABS
4.0000 mg | ORAL_TABLET | Freq: Four times a day (QID) | ORAL | Status: DC | PRN
  Administered 2024-06-12: 4 mg via ORAL
  Filled 2024-06-12: qty 1

## 2024-06-12 MED ORDER — PANTOPRAZOLE SODIUM 40 MG IV SOLR
40.0000 mg | Freq: Two times a day (BID) | INTRAVENOUS | Status: DC
  Administered 2024-06-13 – 2024-06-15 (×4): 40 mg via INTRAVENOUS
  Filled 2024-06-12 (×5): qty 10

## 2024-06-12 MED ORDER — TRAZODONE HCL 50 MG PO TABS
25.0000 mg | ORAL_TABLET | Freq: Every evening | ORAL | Status: DC | PRN
  Administered 2024-06-12 – 2024-06-13 (×2): 25 mg via ORAL
  Filled 2024-06-12 (×2): qty 1

## 2024-06-12 MED ORDER — SODIUM CHLORIDE 0.9 % IV SOLN: INTRAVENOUS | Status: DC

## 2024-06-12 MED ORDER — PNEUMOCOCCAL 20-VAL CONJ VACC 0.5 ML IM SUSY
0.5000 mL | PREFILLED_SYRINGE | INTRAMUSCULAR | Status: AC
  Administered 2024-06-15: 0.5 mL via INTRAMUSCULAR
  Filled 2024-06-12 (×2): qty 0.5

## 2024-06-12 MED ORDER — PANTOPRAZOLE SODIUM 40 MG IV SOLR: 40.0000 mg | INTRAVENOUS | Status: DC

## 2024-06-12 MED ORDER — FAMOTIDINE 20 MG PO TABS
20.0000 mg | ORAL_TABLET | Freq: Once | ORAL | Status: AC
  Administered 2024-06-12: 20 mg via ORAL
  Filled 2024-06-12: qty 1

## 2024-06-12 MED ORDER — MORPHINE SULFATE (PF) 2 MG/ML IV SOLN
2.0000 mg | Freq: Once | INTRAVENOUS | Status: AC
  Administered 2024-06-12: 2 mg via INTRAVENOUS
  Filled 2024-06-12: qty 1

## 2024-06-12 MED ORDER — PANTOPRAZOLE SODIUM 40 MG IV SOLR
80.0000 mg | Freq: Once | INTRAVENOUS | Status: AC
  Administered 2024-06-12: 80 mg via INTRAVENOUS
  Filled 2024-06-12: qty 20

## 2024-06-12 NOTE — ED Triage Notes (Signed)
 Pt c/o generalized torso pain x2 weeks and emesis x1 last night.  Pain score 7/10.  Hx acid reflux.   Pt reports he had been vomiting for a while prior to the pain starting.

## 2024-06-12 NOTE — ED Provider Notes (Signed)
 [place] EMERGENCY DEPARTMENT AT [facility] Provider Note   CSN: [id number] Arrival date & time: 06/12/24  9278     Patient presents with: Chest Pain, Back Pain, and Emesis   William Horne is a 48 y.o. male.  Patient past history significant for tobacco use, marijuana use, PUD, GERD, hepatitis C who presents to the emergency department today with concerns of chest pain.  Reports she has had generalized torso/chest pain for about 2 weeks that was precipitated by vomiting about 2 weeks ago.  Had 1 episode of vomiting last night.  Currently rates pain at 7 out of 10.  Reports minimal oral intake due to discomfort.  Not currently on PPI for PUD or GERD.  Regarding vomiting 2 weeks ago, patient does report that he had some hematemesis that he states are improved after a few days.  But now reporting some darkening of his stool when he is able to have a bowel movement but denies black tarry stool.   Chest Pain Associated symptoms: back pain and vomiting   Back Pain Associated symptoms: chest pain   Emesis      Prior to Admission medications   Medication Sig Start Date End Date Taking? Authorizing Provider  famotidine  (PEPCID ) 20 MG tablet Take 1 tablet (20 mg total) by mouth daily. 07/30/23   Jarold Olam HERO, PA-C  omeprazole  (PRILOSEC) 20 MG capsule Take 20 mg by mouth daily.    [provider]  ondansetron  (ZOFRAN -ODT) 4 MG disintegrating tablet Take 1 tablet (4 mg total) by mouth every 8 (eight) hours as needed for nausea. 07/30/23   Jarold Olam HERO, PA-C    Allergies: Asa [aspirin]    Review of Systems  Cardiovascular:  Positive for chest pain.  Gastrointestinal:  Positive for vomiting.  Musculoskeletal:  Positive for back pain.  All other systems reviewed and are negative.   Updated Vital Signs BP 114/73 (BP Location: Left Arm)   Pulse (!) 51   Temp 97.7 F (36.5 C) (Oral)   Resp 14   Ht 6' 4 (1.93 m)   Wt 79.4 kg   SpO2 100%   BMI 21.30  kg/m   Physical Exam Vitals and nursing note reviewed.  Constitutional:      General: He is not in acute distress.    Appearance: He is well-developed.  HENT:     Head: Normocephalic and atraumatic.   Eyes:     Conjunctiva/sclera: Conjunctivae normal.    Cardiovascular:     Rate and Rhythm: Normal rate and regular rhythm.     Heart sounds: No murmur heard. Pulmonary:     Effort: Pulmonary effort is normal. No respiratory distress.     Breath sounds: Normal breath sounds. No decreased breath sounds, wheezing or rhonchi.  Chest:     Chest wall: Tenderness present.     Comments: Generalized chest wall tenderness Abdominal:     Palpations: Abdomen is soft.     Tenderness: There is abdominal tenderness.     Comments: Generalized abdominal tenderness   Musculoskeletal:        General: No swelling.     Cervical back: Neck supple.     Right lower leg: No tenderness. No edema.     Left lower leg: No tenderness. No edema.   Skin:    General: Skin is warm and dry.     Capillary Refill: Capillary refill takes less than 2 seconds.   Neurological:     Mental Status: He is  alert.   Psychiatric:        Mood and Affect: Mood normal.     (all labs ordered are listed, but only abnormal results are displayed) Labs Reviewed  CBC WITH DIFFERENTIAL/PLATELET - Abnormal; Notable for the following components:      Result Value   Hemoglobin 7.3 (*)    HCT 28.1 (*)    MCV 60.7 (*)    MCH 15.8 (*)    MCHC 26.0 (*)    RDW 24.4 (*)    Platelets 508 (*)    All other components within normal limits  COMPREHENSIVE METABOLIC PANEL WITH GFR - Abnormal; Notable for the following components:   Sodium 134 (*)    Potassium 3.4 (*)    Glucose, Bld 101 (*)    BUN 21 (*)    Calcium 8.8 (*)    All other components within normal limits  RETICULOCYTES - Abnormal; Notable for the following components:   Immature Retic Fract 35.1 (*)    All other components within normal limits  URINALYSIS,  ROUTINE W REFLEX MICROSCOPIC  LIPASE, BLOOD  HIV ANTIBODY (ROUTINE TESTING W REFLEX)  VITAMIN B12  FOLATE  IRON AND TIBC  FERRITIN  POC OCCULT BLOOD, ED  TROPONIN I (HIGH SENSITIVITY)  TROPONIN I (HIGH SENSITIVITY)    EKG: EKG Interpretation Date/Time:  Tuesday June 12 2024 07:25:51 EDT Ventricular Rate:  65 PR Interval:  157 QRS Duration:  91 QT Interval:  422 QTC Calculation: 439 R Axis:   87  Text Interpretation: Sinus rhythm Confirmed by Mannie Pac [phone]) on 06/12/2024 7:28:32 AM  Radiology: DG Chest 2 View Result Date: 06/12/2024 CLINICAL DATA:  Chest pain. EXAM: CHEST - 2 VIEW COMPARISON:  July 29, 2023. FINDINGS: The heart size and mediastinal contours are within normal limits. Both lungs are clear. The visualized skeletal structures are unremarkable. IMPRESSION: No active cardiopulmonary disease. Electronically Signed   By: Lynwood Landy Raddle M.D.   On: 06/12/2024 08:12     .Critical Care  Performed by: Klark Vanderhoef A, PA-C Authorized by: Tesla Bochicchio A, PA-C   Critical care provider statement:    Critical care time (minutes):  60   Critical care start time:  06/12/2024 10:00 AM   Critical care end time:  06/12/2024 11:00 AM   Critical care time was exclusive of:  Separately billable procedures and treating other patients   Critical care was necessary to treat or prevent imminent or life-threatening deterioration of the following conditions:  Circulatory failure   Critical care was time spent personally by me on the following activities:  Blood draw for specimens, development of treatment plan with patient or surrogate, discussions with consultants, evaluation of patient's response to treatment, examination of patient, re-evaluation of patient's condition, ordering and review of radiographic studies, ordering and review of laboratory studies and ordering and performing treatments and interventions   I assumed direction of critical care for this patient from another  provider in my specialty: no     Care discussed with: admitting provider      Medications Ordered in the ED  pantoprazole  (PROTONIX ) injection 40 mg (has no administration in time range)  acetaminophen  (TYLENOL ) tablet 650 mg (has no administration in time range)    Or  acetaminophen  (TYLENOL ) suppository 650 mg (has no administration in time range)  traZODone (DESYREL) tablet 25 mg (has no administration in time range)  ondansetron  (ZOFRAN ) tablet 4 mg (has no administration in time range)    Or  ondansetron  (ZOFRAN )  injection 4 mg (has no administration in time range)  albuterol  (PROVENTIL ) (2.5 MG/3ML) 0.083% nebulizer solution 2.5 mg (has no administration in time range)  alum & mag hydroxide-simeth (MAALOX/MYLANTA) 200-200-20 MG/5ML suspension 30 mL (30 mLs Oral Given 06/12/24 0744)  famotidine  (PEPCID ) tablet 20 mg (20 mg Oral Given 06/12/24 0744)  morphine  (PF) 4 MG/ML injection 4 mg (4 mg Intravenous Given 06/12/24 0751)  pantoprazole  (PROTONIX ) injection 80 mg (80 mg Intravenous Given 06/12/24 0859)  morphine  (PF) 2 MG/ML injection 2 mg (2 mg Intravenous Given 06/12/24 0914)    Clinical Course as of 06/12/24 1200  Tue Jun 12, 2024  1009 Dr. Roxane, hospitalist, will be admitting. Advised consulting to GI given drop of Hgb to see if they would want to perform EGD while here during admission. [OZ]    Clinical Course User Index [OZ] Cecily Legrand LABOR, PA-C                                 Medical Decision Making Amount and/or Complexity of Data Reviewed Labs: ordered. Radiology: ordered.  Risk OTC drugs. Prescription drug management. Decision regarding hospitalization.   This patient presents to the ED for concern of chest pain, this involves an extensive number of treatment options, and is a complaint that carries with it a high risk of complications and morbidity.  The differential diagnosis includes ACS, PE, GERD, PUD, gastritis   Co morbidities that complicate the patient  evaluation  PUD, GERD, gastritis, hepatitis C   Additional history obtained:  Additional history obtained from prior ED visit in August 2024 for similar episode of chest pain   Lab Tests:  I Ordered, and personally interpreted labs.  The pertinent results include: CBC shows notable drop in hemoglobin down to 7.3 with most recent about 10 months ago showing hemoglobin at 11.8, CMP with mild dehydration with sodium 134 potassium 3.4 but no evident AKI, UA unremarkable, troponin unremarkable at 2, lipase normal at 37, Hemoccult negative   Imaging Studies ordered:  I ordered imaging studies including chest x-ray I independently visualized and interpreted imaging which showed no acute cardiopulmonary process I agree with the radiologist interpretation   Cardiac Monitoring: / EKG:  The patient was maintained on a cardiac monitor.  I personally viewed and interpreted the cardiac monitored which showed an underlying rhythm of: Normal sinus rhythm   Consultations Obtained:  I requested consultation with the GI, hospitalist,  and discussed lab and imaging findings as well as pertinent plan - they recommend: Inpatient admission for serial H&H as well as GI evaluation.  Spoke with Dr. Dianna who will post patient for EGD. Spoke with Dr. Roxane who will be admitting patient.   Problem List / ED Course / Critical interventions / Medication management  Patient with past history significant for tobacco use, marijuana use, PUD, GERD, gastritis presents ED with concerns of chest pain and abdominal pain.  Reports has been ongoing for the last 2 weeks precipitated by episodes of vomiting.  At that time, patient reports that he had profuse vomiting for several days and has not improved.  Last vomiting was yesterday x 1.  States that the pain he feels is across his entire chest and diffusely across the entire abdomen. On exam, patient has reproducible chest wall pain to the torso as well as the  abdomen.  No abnormal heart or lung sounds.  Bowel sounds unremarkable.  Basic labs including cardiac workup  initiated for evaluation of source of pain.  Started patient on a course of GI cocktail as well as morphine  for symptom management. Lab work shows hemoglobin notably dropped down to 7.3.  Most recent hemoglobin shows a level at 11.8 about 10 months ago.  Concern for possible GI bleed.  Asked patient more directly regarding concerns for possible GI bleed including hematemesis, hematochezia, melanotic stools.  He does report that when his vomiting began 2 weeks ago, he had had some bloody vomit and has had some darkening of his stool but denies any black or tarry stool.  No prior history of GI bleed.  Not on blood thinners.  If hemoglobin level down is low, start patient on 80 mg pantoprazole  and place consult to GI.  Likely admission to hospitalist for serial H&H and possible transfusion if continuing to drop. I ordered medication including famotidine , Maalox, pantoprazole , morphine  for GI cocktail, concern for GI bleed, pain Reevaluation of the patient after these medicines showed that the patient improved I have reviewed the patients home medicines and have made adjustments as needed   Social Determinants of Health:  History of marijuana use   Test / Admission - Considered:  Given patient's collection of symptoms and subjective findings with lab results, recommended admission for further management of patient.  Final diagnoses:  Symptomatic anemia  Chest wall pain  Hypokalemia  PUD (peptic ulcer disease)    ED Discharge Orders     None          Cecily Legrand LABOR, PA-C 06/12/24 1200    Mannie Pac T, DO 06/14/24 0740

## 2024-06-12 NOTE — H&P (View-Only) (Signed)
 Referring Provider: Dr. Zella Primary Care Physician:  Patient, No Pcp Per Primary Gastroenterologist:  Sampson  Reason for Consultation:  GI Bleed  HPI: William Horne is a 48 y.o. male with history of peptic ulcer disease on an EGD in 2020 has been having epigastric pain and chest pain for the past month along with recurrent vomiting. Vomiting has been red at times and other times black but has not occurred in several weeks. Was having black stools on Pepto-Bismol but has not seen any black stools in over 2 weeks. 10-15 pound weight loss in the past month. Denies NSAIDs. Used to drink heavy years ago but now drinks occasionally and denies any alcohol since last month. History of Hepatitis C.   Past Medical History:  Diagnosis Date   Dental caries    Eczema    Gastritis    Hepatitis C    Shoulder dislocation     Past Surgical History:  Procedure Laterality Date   BIOPSY  07/18/2019   Procedure: BIOPSY;  Surgeon: Dianna Specking, MD;  Location: [facility];  Service: Endoscopy;;   ESOPHAGOGASTRODUODENOSCOPY (EGD) WITH PROPOFOL  N/A 07/18/2019   Procedure: ESOPHAGOGASTRODUODENOSCOPY (EGD) WITH PROPOFOL ;  Surgeon: Dianna Specking, MD;  Location: [facility];  Service: Endoscopy;  Laterality: N/A;    Prior to Admission medications   Medication Sig Start Date End Date Taking? Authorizing Provider  famotidine  (PEPCID ) 20 MG tablet Take 1 tablet (20 mg total) by mouth daily. 07/30/23   Jarold Olam HERO, PA-C  omeprazole  (PRILOSEC) 20 MG capsule Take 20 mg by mouth daily.    [provider]  ondansetron  (ZOFRAN -ODT) 4 MG disintegrating tablet Take 1 tablet (4 mg total) by mouth every 8 (eight) hours as needed for nausea. 07/30/23   Jarold Olam HERO, PA-C    Scheduled Meds:  NOREEN ON 06/13/2024] pantoprazole  (PROTONIX ) IV  40 mg Intravenous Q24H   Continuous Infusions: PRN Meds:.acetaminophen  **OR** acetaminophen , albuterol , ondansetron  **OR** ondansetron  (ZOFRAN ) IV,  traZODone  Allergies as of 06/12/2024 - Review Complete 06/12/2024  Allergen Reaction Noted   Asa [aspirin] Nausea And Vomiting 01/20/2016    Family History  Problem Relation Age of Onset   Rheum arthritis Mother    Osteoarthritis Mother    Hypertension Father    Migraines Sister     Social History   Socioeconomic History   Marital status: Single    Spouse name: Not on file   Number of children: Not on file   Years of education: Not on file   Highest education level: Not on file  Occupational History   Not on file  Tobacco Use   Smoking status: Every Day    Current packs/day: 3.00    Average packs/day: 3.0 packs/day for 15.0 years (45.0 ttl pk-yrs)    Types: Cigarettes   Smokeless tobacco: Former    Quit date: 11/22/2009  Substance and Sexual Activity   Alcohol use: Yes    Comment: a beer a month ago   Drug use: Yes    Types: Marijuana   Sexual activity: Yes    Birth control/protection: None  Other Topics Concern   Not on file  Social History Narrative   Not on file   Social Drivers of Health   Financial Resource Strain: Not on file  Food Insecurity: Not on file  Transportation Needs: Not on file  Physical Activity: Not on file  Stress: Not on file  Social Connections: Not on file  Intimate Partner Violence: Not At Risk (12/21/2023)  Received from [facility]   HITS    Over the last 12 months how often did your partner physically hurt you?: Never    Over the last 12 months how often did your partner insult you or talk down to you?: Never    Over the last 12 months how often did your partner threaten you with physical harm?: Never    Over the last 12 months how often did your partner scream or curse at you?: Never    Review of Systems: All negative except as stated above in HPI.  Physical Exam: Vital signs: Vitals:   06/12/24 0724  BP: 109/76  Pulse: 65  Resp: 16  Temp: 98.3 F (36.8 C)  SpO2: 100%     General:  Lethargic, thin, no acute  distress   Head: normocephalic, atraumatic Eyes: anicteric sclera ENT: oropharynx clear, poor dentition Neck: supple, nontender Lungs:  Clear throughout to auscultation.   No wheezes, crackles, or rhonchi. No acute distress. Heart:  Regular rate and rhythm; no murmurs, clicks, rubs,  or gallops. Abdomen: diffuse tenderness with guarding, soft, nondistended, +BS  Rectal:  Deferred Ext: no edema  GI:  Lab Results: Recent Labs    06/12/24 0756  WBC 8.4  HGB 7.3*  HCT 28.1*  PLT 508*   BMET Recent Labs    06/12/24 0756  NA 134*  K 3.4*  CL 101  CO2 24  GLUCOSE 101*  BUN 21*  CREATININE 1.16  CALCIUM 8.8*   LFT Recent Labs    06/12/24 0756  PROT 7.5  ALBUMIN 3.7  AST 16  ALT 14  ALKPHOS 56  BILITOT 0.5   PT/INR No results for input(s): LABPROT, INR in the last 72 hours.   Studies/Results: DG Chest 2 View Result Date: 06/12/2024 CLINICAL DATA:  Chest pain. EXAM: CHEST - 2 VIEW COMPARISON:  July 29, 2023. FINDINGS: The heart size and mediastinal contours are within normal limits. Both lungs are clear. The visualized skeletal structures are unremarkable. IMPRESSION: No active cardiopulmonary disease. Electronically Signed   By: Lynwood Landy Raddle M.D.   On: 06/12/2024 08:12    Impression/Plan: GI bleed - question peptic ulcer disease or other upper tract source. EGD tomorrow to further evaluate. IV PPI Q 12. Clear liquid diet. NPO p MN. Supportive care.    LOS: 0 days   [name]  06/12/2024, 11:37 AM  Questions please call [phone]

## 2024-06-12 NOTE — ED Notes (Signed)
 RN called floor informing nurse patient is about to get transported

## 2024-06-12 NOTE — Consult Note (Signed)
 Referring Provider: Dr. Zella Primary Care Physician:  Patient, No Pcp Per Primary Gastroenterologist:  Sampson  Reason for Consultation:  GI Bleed  HPI: William Horne is a 48 y.o. male with history of peptic ulcer disease on an EGD in 2020 has been having epigastric pain and chest pain for the past month along with recurrent vomiting. Vomiting has been red at times and other times black but has not occurred in several weeks. Was having black stools on Pepto-Bismol but has not seen any black stools in over 2 weeks. 10-15 pound weight loss in the past month. Denies NSAIDs. Used to drink heavy years ago but now drinks occasionally and denies any alcohol since last month. History of Hepatitis C.   Past Medical History:  Diagnosis Date   Dental caries    Eczema    Gastritis    Hepatitis C    Shoulder dislocation     Past Surgical History:  Procedure Laterality Date   BIOPSY  07/18/2019   Procedure: BIOPSY;  Surgeon: Dianna Specking, MD;  Location: [facility];  Service: Endoscopy;;   ESOPHAGOGASTRODUODENOSCOPY (EGD) WITH PROPOFOL  N/A 07/18/2019   Procedure: ESOPHAGOGASTRODUODENOSCOPY (EGD) WITH PROPOFOL ;  Surgeon: Dianna Specking, MD;  Location: [facility];  Service: Endoscopy;  Laterality: N/A;    Prior to Admission medications   Medication Sig Start Date End Date Taking? Authorizing Provider  famotidine  (PEPCID ) 20 MG tablet Take 1 tablet (20 mg total) by mouth daily. 07/30/23   Jarold Olam HERO, PA-C  omeprazole  (PRILOSEC) 20 MG capsule Take 20 mg by mouth daily.    [provider]  ondansetron  (ZOFRAN -ODT) 4 MG disintegrating tablet Take 1 tablet (4 mg total) by mouth every 8 (eight) hours as needed for nausea. 07/30/23   Jarold Olam HERO, PA-C    Scheduled Meds:  NOREEN ON 06/13/2024] pantoprazole  (PROTONIX ) IV  40 mg Intravenous Q24H   Continuous Infusions: PRN Meds:.acetaminophen  **OR** acetaminophen , albuterol , ondansetron  **OR** ondansetron  (ZOFRAN ) IV,  traZODone  Allergies as of 06/12/2024 - Review Complete 06/12/2024  Allergen Reaction Noted   Asa [aspirin] Nausea And Vomiting 01/20/2016    Family History  Problem Relation Age of Onset   Rheum arthritis Mother    Osteoarthritis Mother    Hypertension Father    Migraines Sister     Social History   Socioeconomic History   Marital status: Single    Spouse name: Not on file   Number of children: Not on file   Years of education: Not on file   Highest education level: Not on file  Occupational History   Not on file  Tobacco Use   Smoking status: Every Day    Current packs/day: 3.00    Average packs/day: 3.0 packs/day for 15.0 years (45.0 ttl pk-yrs)    Types: Cigarettes   Smokeless tobacco: Former    Quit date: 11/22/2009  Substance and Sexual Activity   Alcohol use: Yes    Comment: a beer a month ago   Drug use: Yes    Types: Marijuana   Sexual activity: Yes    Birth control/protection: None  Other Topics Concern   Not on file  Social History Narrative   Not on file   Social Drivers of Health   Financial Resource Strain: Not on file  Food Insecurity: Not on file  Transportation Needs: Not on file  Physical Activity: Not on file  Stress: Not on file  Social Connections: Not on file  Intimate Partner Violence: Not At Risk (12/21/2023)  Received from [facility]   HITS    Over the last 12 months how often did your partner physically hurt you?: Never    Over the last 12 months how often did your partner insult you or talk down to you?: Never    Over the last 12 months how often did your partner threaten you with physical harm?: Never    Over the last 12 months how often did your partner scream or curse at you?: Never    Review of Systems: All negative except as stated above in HPI.  Physical Exam: Vital signs: Vitals:   06/12/24 0724  BP: 109/76  Pulse: 65  Resp: 16  Temp: 98.3 F (36.8 C)  SpO2: 100%     General:  Lethargic, thin, no acute  distress   Head: normocephalic, atraumatic Eyes: anicteric sclera ENT: oropharynx clear, poor dentition Neck: supple, nontender Lungs:  Clear throughout to auscultation.   No wheezes, crackles, or rhonchi. No acute distress. Heart:  Regular rate and rhythm; no murmurs, clicks, rubs,  or gallops. Abdomen: diffuse tenderness with guarding, soft, nondistended, +BS  Rectal:  Deferred Ext: no edema  GI:  Lab Results: Recent Labs    06/12/24 0756  WBC 8.4  HGB 7.3*  HCT 28.1*  PLT 508*   BMET Recent Labs    06/12/24 0756  NA 134*  K 3.4*  CL 101  CO2 24  GLUCOSE 101*  BUN 21*  CREATININE 1.16  CALCIUM 8.8*   LFT Recent Labs    06/12/24 0756  PROT 7.5  ALBUMIN 3.7  AST 16  ALT 14  ALKPHOS 56  BILITOT 0.5   PT/INR No results for input(s): LABPROT, INR in the last 72 hours.   Studies/Results: DG Chest 2 View Result Date: 06/12/2024 CLINICAL DATA:  Chest pain. EXAM: CHEST - 2 VIEW COMPARISON:  July 29, 2023. FINDINGS: The heart size and mediastinal contours are within normal limits. Both lungs are clear. The visualized skeletal structures are unremarkable. IMPRESSION: No active cardiopulmonary disease. Electronically Signed   By: Lynwood Landy Raddle M.D.   On: 06/12/2024 08:12    Impression/Plan: GI bleed - question peptic ulcer disease or other upper tract source. EGD tomorrow to further evaluate. IV PPI Q 12. Clear liquid diet. NPO p MN. Supportive care.    LOS: 0 days   William Horne  06/12/2024, 11:37 AM  Questions please call [phone]

## 2024-06-12 NOTE — Anesthesia Preprocedure Evaluation (Signed)
 Anesthesia Evaluation  Patient identified by MRN, date of birth, ID band Patient awake    Reviewed: Allergy & Precautions, NPO status , Patient's Chart, lab work & pertinent test results  Airway Mallampati: II  TM Distance: >3 FB Neck ROM: Full    Dental  (+) Chipped, Dental Advisory Given,    Pulmonary Current Smoker and Patient abstained from smoking.   Pulmonary exam normal breath sounds clear to auscultation       Cardiovascular negative cardio ROS Normal cardiovascular exam Rhythm:Regular Rate:Normal     Neuro/Psych negative neurological ROS  negative psych ROS   GI/Hepatic PUD,,,(+)     substance abuse  marijuana use, Hepatitis -, C  Endo/Other  negative endocrine ROS    Renal/GU negative Renal ROS  negative genitourinary   Musculoskeletal negative musculoskeletal ROS (+)    Abdominal   Peds  Hematology  (+) Blood dyscrasia, anemia   Anesthesia Other Findings  48 y.o. male with medical history significant for hepatitis C, gastritis, history of nonbleeding gastric ulcers seen on EGD in 2020 being admitted to the hospital with abdominal pain, vomiting and suspected blood loss anemia  Reproductive/Obstetrics                             Anesthesia Physical Anesthesia Plan  ASA: 3  Anesthesia Plan: MAC   Post-op Pain Management:    Induction: Intravenous  PONV Risk Score and Plan: Propofol  infusion and Treatment may vary due to age or medical condition  Airway Management Planned: Natural Airway  Additional Equipment:   Intra-op Plan:   Post-operative Plan:   Informed Consent: I have reviewed the patients History and Physical, chart, labs and discussed the procedure including the risks, benefits and alternatives for the proposed anesthesia with the patient or authorized representative who has indicated his/her understanding and acceptance.     Dental advisory  given  Plan Discussed with: CRNA  Anesthesia Plan Comments:        Anesthesia Quick Evaluation

## 2024-06-12 NOTE — H&P (Signed)
 History and Physical  Garritt Horne FMW:[id number] DOB: 02/20/1976 DOA: 06/12/2024  PCP: Patient, No Pcp Per   Chief Complaint: Vomiting and epigastric pain  HPI: William Horne is a 48 y.o. male with medical history significant for hepatitis C, gastritis, history of nonbleeding gastric ulcers seen on EGD in 2020 being admitted to the hospital with abdominal pain, vomiting and suspected blood loss anemia.  Patient states that he was in his usual state of health until about a week ago, when he started having intermittent lower chest/upper abdominal pain which radiates into the lower abdomen and sometimes into his low back.  He had some associated nausea, and vomiting in the last 48 hours.  He is not sure if the emesis was bloody or dark.  Starting about a week ago, he noticed that his bowel movements were very black and tarry and loose.  However now they have become normal in color, and are hard.  He denies any dizziness or lightheadedness, fevers, chills.  He is not on any blood thinners, and denies any NSAID use.  He has continued to use marijuana intermittently, but denies any illicit drug use.  Review of Systems: Please see HPI for pertinent positives and negatives. A complete 10 system review of systems are otherwise negative.  Past Medical History:  Diagnosis Date   Dental caries    Eczema    Gastritis    Hepatitis C    Shoulder dislocation    Past Surgical History:  Procedure Laterality Date   BIOPSY  07/18/2019   Procedure: BIOPSY;  Surgeon: Dianna Specking, MD;  Location: [facility];  Service: Endoscopy;;   ESOPHAGOGASTRODUODENOSCOPY (EGD) WITH PROPOFOL  N/A 07/18/2019   Procedure: ESOPHAGOGASTRODUODENOSCOPY (EGD) WITH PROPOFOL ;  Surgeon: Dianna Specking, MD;  Location: [facility];  Service: Endoscopy;  Laterality: N/A;   Social History:  reports that he has been smoking cigarettes. He has a 45 pack-year smoking history. He quit smokeless tobacco use about 14 years  ago. He reports current alcohol use. He reports current drug use. Drug: Marijuana.  Allergies  Allergen Reactions   Asa [Aspirin] Nausea And Vomiting    Family History  Problem Relation Age of Onset   Rheum arthritis Mother    Osteoarthritis Mother    Hypertension Father    Migraines Sister      Prior to Admission medications   Medication Sig Start Date End Date Taking? Authorizing Provider  famotidine  (PEPCID ) 20 MG tablet Take 1 tablet (20 mg total) by mouth daily. 07/30/23   Jarold Olam HERO, PA-C  omeprazole  (PRILOSEC) 20 MG capsule Take 20 mg by mouth daily.    [provider]  ondansetron  (ZOFRAN -ODT) 4 MG disintegrating tablet Take 1 tablet (4 mg total) by mouth every 8 (eight) hours as needed for nausea. 07/30/23   Jarold Olam HERO, PA-C    Physical Exam: BP 109/76 (BP Location: Left Arm)   Pulse 65   Temp 98.3 F (36.8 C) (Oral)   Resp 16   Ht 6' 4 (1.93 m)   Wt 79.4 kg   SpO2 100%   BMI 21.30 kg/m  General:  Alert, oriented, calm, in no acute distress  Eyes: EOMI, clear conjuctivae, white sclerea Neck: supple, no masses, trachea mildline  Cardiovascular: RRR, no murmurs or rubs, no peripheral edema  Respiratory: clear to auscultation bilaterally, no wheezes, no crackles  Abdomen: soft, nontender, nondistended, normal bowel tones heard  Skin: dry, no rashes  Musculoskeletal: no joint effusions, normal range of motion  Psychiatric:  appropriate affect, normal speech  Neurologic: extraocular muscles intact, clear speech, moving all extremities with intact sensorium         Labs on Admission:  Basic Metabolic Panel: Recent Labs  Lab 06/12/24 0756  NA 134*  K 3.4*  CL 101  CO2 24  GLUCOSE 101*  BUN 21*  CREATININE 1.16  CALCIUM 8.8*   Liver Function Tests: Recent Labs  Lab 06/12/24 0756  AST 16  ALT 14  ALKPHOS 56  BILITOT 0.5  PROT 7.5  ALBUMIN 3.7   Recent Labs  Lab 06/12/24 0756  LIPASE 37   No results for input(s): AMMONIA in  the last 168 hours. CBC: Recent Labs  Lab 06/12/24 0756  WBC 8.4  NEUTROABS 5.2  HGB 7.3*  HCT 28.1*  MCV 60.7*  PLT 508*   Cardiac Enzymes: No results for input(s): CKTOTAL, CKMB, CKMBINDEX, TROPONINI in the last 168 hours. BNP (last 3 results) No results for input(s): BNP in the last 8760 hours.  ProBNP (last 3 results) No results for input(s): PROBNP in the last 8760 hours.  CBG: No results for input(s): GLUCAP in the last 168 hours.  Radiological Exams on Admission: DG Chest 2 View Result Date: 06/12/2024 CLINICAL DATA:  Chest pain. EXAM: CHEST - 2 VIEW COMPARISON:  July 29, 2023. FINDINGS: The heart size and mediastinal contours are within normal limits. Both lungs are clear. The visualized skeletal structures are unremarkable. IMPRESSION: No active cardiopulmonary disease. Electronically Signed   By: Lynwood Landy Raddle M.D.   On: 06/12/2024 08:12   Assessment/Plan William Horne is a 48 y.o. male with medical history significant for hepatitis C, gastritis, history of nonbleeding gastric ulcers seen on EGD in 2020 being admitted to the hospital with abdominal pain, vomiting and suspected blood loss anemia.  Abdominal pain and nausea-with known history of peptic ulcer disease, recent melanotic stools and acute anemia.  Suspicion is for recently bleeding gastric ulcer.  Abdomen is minimally tender to exam, and soft without rebound or guarding.  I have very low suspicion for visceral perforation or other complication. -Observation admission -Clear liquid diet -Avoid blood thinners -GI consult -Pain and nausea medication as needed -IV PPI  Anemia-with recent melanotic stools and known peptic ulcer disease, likely due to blood loss.  Currently not meeting criteria for blood transfusion. -Monitor hemoglobin with daily labs -Anemia panel  DVT prophylaxis: SCDs     Code Status: Full Code  Consults called: GI  Admission status: Obs   Time spent: 48  minutes  William Horne CHRISTELLA Gail MD [facility] Pager [phone]  If 7PM-7AM, please contact night-coverage [email or web address] Password [email or web address]  06/12/2024, 10:09 AM

## 2024-06-13 ENCOUNTER — Inpatient Hospital Stay (HOSPITAL_COMMUNITY): Payer: Self-pay | Admitting: Anesthesiology

## 2024-06-13 ENCOUNTER — Encounter (HOSPITAL_COMMUNITY)
Admission: EM | Disposition: A | Discharge status: Home / Self Care | Payer: Self-pay | Source: Home / Self Care | Attending: Internal Medicine

## 2024-06-13 ENCOUNTER — Encounter (HOSPITAL_COMMUNITY): Payer: Self-pay | Admitting: Internal Medicine

## 2024-06-13 DIAGNOSIS — K254 Chronic or unspecified gastric ulcer with hemorrhage: Secondary | ICD-10-CM | POA: Diagnosis present

## 2024-06-13 DIAGNOSIS — D62 Acute posthemorrhagic anemia: Secondary | ICD-10-CM | POA: Diagnosis present

## 2024-06-13 DIAGNOSIS — Z8619 Personal history of other infectious and parasitic diseases: Secondary | ICD-10-CM | POA: Diagnosis not present

## 2024-06-13 DIAGNOSIS — Z23 Encounter for immunization: Secondary | ICD-10-CM | POA: Diagnosis not present

## 2024-06-13 DIAGNOSIS — K922 Gastrointestinal hemorrhage, unspecified: Secondary | ICD-10-CM | POA: Diagnosis not present

## 2024-06-13 DIAGNOSIS — K219 Gastro-esophageal reflux disease without esophagitis: Secondary | ICD-10-CM | POA: Diagnosis present

## 2024-06-13 DIAGNOSIS — B9681 Helicobacter pylori [H. pylori] as the cause of diseases classified elsewhere: Secondary | ICD-10-CM | POA: Diagnosis present

## 2024-06-13 DIAGNOSIS — K2950 Unspecified chronic gastritis without bleeding: Secondary | ICD-10-CM | POA: Diagnosis present

## 2024-06-13 DIAGNOSIS — D649 Anemia, unspecified: Secondary | ICD-10-CM | POA: Diagnosis present

## 2024-06-13 DIAGNOSIS — K259 Gastric ulcer, unspecified as acute or chronic, without hemorrhage or perforation: Secondary | ICD-10-CM

## 2024-06-13 DIAGNOSIS — Z886 Allergy status to analgesic agent status: Secondary | ICD-10-CM | POA: Diagnosis not present

## 2024-06-13 DIAGNOSIS — E876 Hypokalemia: Secondary | ICD-10-CM | POA: Diagnosis present

## 2024-06-13 DIAGNOSIS — Z8261 Family history of arthritis: Secondary | ICD-10-CM | POA: Diagnosis not present

## 2024-06-13 DIAGNOSIS — Z8249 Family history of ischemic heart disease and other diseases of the circulatory system: Secondary | ICD-10-CM | POA: Diagnosis not present

## 2024-06-13 DIAGNOSIS — F17210 Nicotine dependence, cigarettes, uncomplicated: Secondary | ICD-10-CM | POA: Diagnosis present

## 2024-06-13 DIAGNOSIS — Q396 Congenital diverticulum of esophagus: Secondary | ICD-10-CM | POA: Diagnosis not present

## 2024-06-13 DIAGNOSIS — R1013 Epigastric pain: Secondary | ICD-10-CM | POA: Diagnosis present

## 2024-06-13 DIAGNOSIS — Z79899 Other long term (current) drug therapy: Secondary | ICD-10-CM | POA: Diagnosis not present

## 2024-06-13 HISTORY — PX: ESOPHAGOGASTRODUODENOSCOPY: SHX5428

## 2024-06-13 LAB — CBC
HCT: 24.2 % — ABNORMAL LOW (ref 39.0–52.0)
Hemoglobin: 6.4 g/dL — CL (ref 13.0–17.0)
MCH: 15.6 pg — ABNORMAL LOW (ref 26.0–34.0)
MCHC: 26.4 g/dL — ABNORMAL LOW (ref 30.0–36.0)
MCV: 58.9 fL — ABNORMAL LOW (ref 80.0–100.0)
Platelets: 439 10*3/uL — ABNORMAL HIGH (ref 150–400)
RBC: 4.11 MIL/uL — ABNORMAL LOW (ref 4.22–5.81)
RDW: 23.6 % — ABNORMAL HIGH (ref 11.5–15.5)
WBC: 5.7 10*3/uL (ref 4.0–10.5)
nRBC: 0 % (ref 0.0–0.2)

## 2024-06-13 LAB — BASIC METABOLIC PANEL WITH GFR
Anion gap: 7 (ref 5–15)
BUN: 13 mg/dL (ref 6–20)
CO2: 24 mmol/L (ref 22–32)
Calcium: 8.2 mg/dL — ABNORMAL LOW (ref 8.9–10.3)
Chloride: 103 mmol/L (ref 98–111)
Creatinine, Ser: 0.83 mg/dL (ref 0.61–1.24)
GFR, Estimated: >60 mL/min (ref >60)
Glucose, Bld: 88 mg/dL (ref 70–99)
Potassium: 3.8 mmol/L (ref 3.5–5.1)
Sodium: 134 mmol/L — ABNORMAL LOW (ref 135–145)

## 2024-06-13 LAB — PREPARE RBC (CROSSMATCH)

## 2024-06-13 LAB — POCT I-STAT, CHEM 8
BUN: 11 mg/dL (ref 6–20)
Calcium, Ion: 1.24 mmol/L (ref 1.15–1.40)
Chloride: 102 mmol/L (ref 98–111)
Creatinine, Ser: 1.1 mg/dL (ref 0.61–1.24)
Glucose, Bld: 81 mg/dL (ref 70–99)
HCT: 31 % — ABNORMAL LOW (ref 39.0–52.0)
Hemoglobin: 10.5 g/dL — ABNORMAL LOW (ref 13.0–17.0)
Potassium: 4.4 mmol/L (ref 3.5–5.1)
Sodium: 137 mmol/L (ref 135–145)
TCO2: 25 mmol/L (ref 22–32)

## 2024-06-13 LAB — PATHOLOGIST SMEAR REVIEW

## 2024-06-13 SURGERY — EGD (ESOPHAGOGASTRODUODENOSCOPY)
Anesthesia: Monitor Anesthesia Care

## 2024-06-13 MED ORDER — SODIUM CHLORIDE 0.9% IV SOLUTION: Freq: Once | INTRAVENOUS | Status: AC

## 2024-06-13 MED ORDER — PROPOFOL 10 MG/ML IV BOLUS
INTRAVENOUS | Status: DC | PRN
  Administered 2024-06-13: 10 mg via INTRAVENOUS
  Administered 2024-06-13: 30 mg via INTRAVENOUS
  Administered 2024-06-13: 20 mg via INTRAVENOUS

## 2024-06-13 MED ORDER — PROPOFOL 500 MG/50ML IV EMUL
INTRAVENOUS | Status: DC | PRN
  Administered 2024-06-13: 200 ug/kg/min via INTRAVENOUS

## 2024-06-13 MED ORDER — DEXMEDETOMIDINE HCL IN NACL 200 MCG/50ML IV SOLN
INTRAVENOUS | Status: DC | PRN
  Administered 2024-06-13: 8 ug via INTRAVENOUS

## 2024-06-13 MED ORDER — PROPOFOL 500 MG/50ML IV EMUL
INTRAVENOUS | Status: AC
  Filled 2024-06-13: qty 50

## 2024-06-13 MED ORDER — PROPOFOL 10 MG/ML IV BOLUS
INTRAVENOUS | Status: AC
  Filled 2024-06-13: qty 20

## 2024-06-13 NOTE — Plan of Care (Signed)
   Problem: Education: Goal: Knowledge of General Education information will improve Description Including pain rating scale, medication(s)/side effects and non-pharmacologic comfort measures Outcome: Progressing   Problem: Health Behavior/Discharge Planning: Goal: Ability to manage health-related needs will improve Outcome: Progressing

## 2024-06-13 NOTE — Progress Notes (Signed)
 HGB resulted 6.4, NP extender on call updated and orders received.

## 2024-06-13 NOTE — Transfer of Care (Signed)
 Immediate Anesthesia Transfer of Care Note  Patient: William Horne  Procedure(s) Performed: EGD (ESOPHAGOGASTRODUODENOSCOPY)  Patient Location: PACU and Endoscopy Unit  Anesthesia Type:MAC  Level of Consciousness: awake, alert , and oriented  Airway & Oxygen Therapy: Patient Spontanous Breathing and Patient connected to face mask oxygen  Post-op Assessment: Report given to RN and Post -op Vital signs reviewed and stable  Post vital signs: Reviewed and stable  Last Vitals:  Vitals Value Taken Time  BP 100/62 06/13/24 14:24  Temp    Pulse 74 06/13/24 14:24  Resp 14 06/13/24 14:25  SpO2 100 % 06/13/24 14:24  Vitals shown include unfiled device data.  Last Pain:  Vitals:   06/13/24 1424  TempSrc: Temporal  PainSc: 0-No pain         Complications: No notable events documented.

## 2024-06-13 NOTE — Anesthesia Postprocedure Evaluation (Signed)
 Anesthesia Post Note  Patient: William Horne  Procedure(s) Performed: EGD (ESOPHAGOGASTRODUODENOSCOPY)     Patient location during evaluation: Endoscopy Anesthesia Type: MAC Level of consciousness: awake and alert Pain management: pain level controlled Vital Signs Assessment: post-procedure vital signs reviewed and stable Respiratory status: spontaneous breathing, nonlabored ventilation, respiratory function stable and patient connected to nasal cannula oxygen Cardiovascular status: stable and blood pressure returned to baseline Postop Assessment: no apparent nausea or vomiting Anesthetic complications: no  No notable events documented.  Last Vitals:  Vitals:   06/13/24 1440 06/13/24 1445  BP: 105/64   Pulse: (!) 57 (!) 50  Resp: 16 15  Temp:    SpO2: 100% 100%    Last Pain:  Vitals:   06/13/24 1440  TempSrc:   PainSc: 0-No pain                 Natale Barba L Enas Winchel

## 2024-06-13 NOTE — Op Note (Signed)
 [facility] Patient Name: William Horne Procedure Date: 06/13/2024 MRN: [id number] Attending MD: [name] , MD, [id number] Date of Birth: May 18, 1976 CSN: [id number] Age: 48 Admit Type: Inpatient Procedure:                Upper GI endoscopy Indications:              Acute post hemorrhagic anemia, Hematemesis Providers:                Jerrell KYM Sol, MD, Randall Lines, RN, Farris Southgate, Technician Referring MD:             hospital team Medicines:                Propofol  per Anesthesia, Monitored Anesthesia Care Complications:            No immediate complications. Estimated Blood Loss:     Estimated blood loss was minimal. Procedure:                Pre-Anesthesia Assessment:                           - Prior to the procedure, a History and Physical                            was performed, and patient medications and                            allergies were reviewed. The patient's tolerance of                            previous anesthesia was also reviewed. The risks                            and benefits of the procedure and the sedation                            options and risks were discussed with the patient.                            All questions were answered, and informed consent                            was obtained. Prior Anticoagulants: The patient has                            taken no anticoagulant or antiplatelet agents. ASA                            Grade Assessment: III - A patient with severe                            systemic disease. After reviewing the risks and  benefits, the patient was deemed in satisfactory                            condition to undergo the procedure.                           After obtaining informed consent, the endoscope was                            passed under direct vision. Throughout the                            procedure, the patient's  blood pressure, pulse, and                            oxygen saturations were monitored continuously. The                            [id number]-H190 ([id number]) Olympus endoscope was introduced                            through the mouth, and advanced to the second part                            of duodenum. The upper GI endoscopy was                            accomplished without difficulty. The patient                            tolerated the procedure well. Scope In: Scope Out: Findings:      The Z-line was regular and was found 40 cm from the incisors.      One non-bleeding cratered gastric ulcer with no stigmata of bleeding was       found on the lesser curvature of the stomach. The lesion was 14 mm in       largest dimension.      Segmental severe inflammation characterized by congestion (edema) and       erythema was found on the lesser curvature of the stomach and on the       posterior wall of the stomach. Biopsies were taken with a cold forceps       for histology. Estimated blood loss was minimal.      Segmental moderate inflammation characterized by congestion (edema) and       erythema was found in the gastric antrum. Biopsies were taken with a       cold forceps for histology. Estimated blood loss was minimal.      The examined duodenum was normal.      A non-bleeding diverticulum with a small opening and no stigmata of       recent bleeding was found in the distal esophagus. Impression:               - Z-line regular, 40 cm from the incisors.                           -  Non-bleeding gastric ulcer with no stigmata of                            bleeding.                           - Chronic gastritis. Biopsied.                           - Gastritis. Biopsied.                           - Normal examined duodenum.                           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                           - Await pathology results.                           - Perform CT scan (computed tomography) of the                            abdomen with contrast tomorrow. Procedure Code(s):        --- Professional ---                           [phone], Esophagogastroduodenoscopy, flexible,                            transoral; with biopsy, single or multiple Diagnosis Code(s):        --- Professional ---                           K92.0, Hematemesis                           K25.9, Gastric ulcer, unspecified as acute or                            chronic, without hemorrhage or perforation                           K29.70, Gastritis, unspecified, without bleeding                           K29.50, Unspecified chronic gastritis without                            bleeding                           D62, Acute posthemorrhagic anemia  Q39.6, Congenital diverticulum of esophagus CPT copyright 2022 [employer] Association. All rights reserved. The codes documented in this report are preliminary and upon coder review may  be revised to meet current compliance requirements. [name], MD 06/13/2024 2:24:12 PM This report has been signed electronically. Number of Addenda: 0

## 2024-06-13 NOTE — Progress Notes (Signed)
 PROGRESS NOTE    William Horne  FMW:[id number] DOB: [birth year] DOA: 06/12/2024 PCP: Patient, No Pcp Per    Brief Narrative:  William Horne is a 48 y.o. male with medical history significant for hepatitis C, gastritis, history of nonbleeding gastric ulcers seen on EGD in 2020 being admitted to the hospital with abdominal pain, vomiting and suspected blood loss anemia.  Patient states that he was in his usual state of health until about a week ago, when he started having intermittent lower chest/upper abdominal pain which radiates into the lower abdomen and sometimes into his low back.  He had some associated nausea, and vomiting in the last 48 hours.  He is not sure if the emesis was bloody or dark.  Starting about a week ago, he noticed that his bowel movements were very black and tarry and loose.  But since have returned to normal.  Plan for EGD on 6/25  Assessment and Plan: Abdominal pain and nausea-with known history of peptic ulcer disease, recent melanotic stools and acute anemia.  Suspicion is for recently bleeding gastric ulcer.  Abdomen is minimally tender to exam, and soft without rebound or guarding.  I have very low suspicion for visceral perforation or other complication. -Avoid blood thinners -GI consult: Plan for EGD 6/25 -Pain and nausea medication as needed -IV PPI   Anemia-with recent melanotic stools and known peptic ulcer disease, likely due to blood loss.   -Hemoglobin less than 7 so will transfuse a unit  Hypokalemia - Replete   DVT prophylaxis: SCDs Start: 06/12/24 1008    Code Status: Full Code   Disposition Plan:  Level of care: Progressive Status is: Observation     Consultants:  GI   Subjective: No current complaints, agreeable to blood transfusion but he says he has not yet gotten it  Objective: Vitals:   06/13/24 0015 06/13/24 0411 06/13/24 0915 06/13/24 0950  BP: 93/63 101/64 115/76 109/72  Pulse: (!) 53 64 62 (!) 58  Resp: 16    14  Temp: 97.8 F (36.6 C) 98.3 F (36.8 C) 98.2 F (36.8 C) 98.6 F (37 C)  TempSrc: Oral Oral Oral Oral  SpO2: 100% 100% 100% 100%  Weight:      Height:        Intake/Output Summary (Last 24 hours) at 06/13/2024 1050 Last data filed at 06/13/2024 0300 Gross per 24 hour  Intake 304.67 ml  Output --  Net 304.67 ml   Filed Weights   06/12/24 0733 06/12/24 1200  Weight: 79.4 kg 76.4 kg    Examination:   General: Appearance:    Well developed, well nourished male in no acute distress     Lungs:     Clear to auscultation bilaterally, respirations unlabored  Heart:    Bradycardic. Normal rhythm. No murmurs, rubs, or gallops.    MS:   All extremities are intact.    Neurologic:   Awake, alert, oriented x 3. No apparent focal neurological           defect.        Data Reviewed: I have personally reviewed following labs and imaging studies  CBC: Recent Labs  Lab 06/12/24 0756 06/13/24 0109  WBC 8.4 5.7  NEUTROABS 5.2  --   HGB 7.3* 6.4*  HCT 28.1* 24.2*  MCV 60.7* 58.9*  PLT 508* 439*   Basic Metabolic Panel: Recent Labs  Lab 06/12/24 0756 06/13/24 0109  NA 134* 134*  K 3.4* 3.8  CL 101  103  CO2 24 24  GLUCOSE 101* 88  BUN 21* 13  CREATININE 1.16 0.83  CALCIUM 8.8* 8.2*   GFR: Estimated Creatinine Clearance: 118.9 mL/min (by C-G formula based on SCr of 0.83 mg/dL). Liver Function Tests: Recent Labs  Lab 06/12/24 0756  AST 16  ALT 14  ALKPHOS 56  BILITOT 0.5  PROT 7.5  ALBUMIN 3.7   Recent Labs  Lab 06/12/24 0756  LIPASE 37   No results for input(s): AMMONIA in the last 168 hours. Coagulation Profile: No results for input(s): INR, PROTIME in the last 168 hours. Cardiac Enzymes: No results for input(s): CKTOTAL, CKMB, CKMBINDEX, TROPONINI in the last 168 hours. BNP (last 3 results) No results for input(s): PROBNP in the last 8760 hours. HbA1C: No results for input(s): HGBA1C in the last 72 hours. CBG: No results for  input(s): GLUCAP in the last 168 hours. Lipid Profile: No results for input(s): CHOL, HDL, LDLCALC, TRIG, CHOLHDL, LDLDIRECT in the last 72 hours. Thyroid Function Tests: No results for input(s): TSH, T4TOTAL, FREET4, T3FREE, THYROIDAB in the last 72 hours. Anemia Panel: Recent Labs    06/12/24 1050  VITAMINB12 303  FOLATE 10.5  FERRITIN 3*  TIBC 407  IRON 15*  RETICCTPCT 1.3   Sepsis Labs: No results for input(s): PROCALCITON, LATICACIDVEN in the last 168 hours.  No results found for this or any previous visit (from the past 240 hours).       Radiology Studies: DG Chest 2 View Result Date: 06/12/2024 CLINICAL DATA:  Chest pain. EXAM: CHEST - 2 VIEW COMPARISON:  July 29, 2023. FINDINGS: The heart size and mediastinal contours are within normal limits. Both lungs are clear. The visualized skeletal structures are unremarkable. IMPRESSION: No active cardiopulmonary disease. Electronically Signed   By: Lynwood Landy Raddle M.D.   On: 06/12/2024 08:12        Scheduled Meds:  feeding supplement  1 Container Oral TID BM   pantoprazole  (PROTONIX ) IV  40 mg Intravenous Q12H   pneumococcal 20-valent conjugate vaccine  0.5 mL Intramuscular Tomorrow-1000   Continuous Infusions:  sodium chloride  20 mL/hr at 06/12/24 2346     LOS: 0 days    Time spent: 45 minutes spent on chart review, discussion with nursing staff, consultants, updating family and interview/physical exam; more than 50% of that time was spent in counseling and/or coordination of care.    Harlene RAYMOND Bowl, DO [facility] Available via Epic secure chat 7am-7pm After these hours, please refer to coverage provider listed on [email or web address] 06/13/2024, 10:50 AM

## 2024-06-13 NOTE — Progress Notes (Signed)
   06/13/24 0913  TOC Brief Assessment  Insurance and Status Reviewed  Patient has primary care physician No  Home environment has been reviewed single family home  Prior level of function: independent  Prior/Current Home Services No current home services  Social Drivers of Health Review SDOH reviewed no interventions necessary  Readmission risk has been reviewed Yes  Transition of care needs no transition of care needs at this time    Heather Saltness, MSW, LCSW 06/13/2024 9:13 AM

## 2024-06-13 NOTE — Interval H&P Note (Signed)
 History and Physical Interval Note:  06/13/2024 12:48 PM  William Horne  has presented today for surgery, with the diagnosis of Gastrointestinal Bleed.  The various methods of treatment have been discussed with the patient and family. After consideration of risks, benefits and other options for treatment, the patient has consented to  Procedure(s): EGD (ESOPHAGOGASTRODUODENOSCOPY) (N/A) as a surgical intervention.  The patient's history has been reviewed, patient examined, no change in status, stable for surgery.  I have reviewed the patient's chart and labs.  Questions were answered to the patient's satisfaction.     [name]

## 2024-06-13 NOTE — Anesthesia Procedure Notes (Signed)
 Procedure Name: MAC Date/Time: 06/13/2024 1:55 PM  Performed by: Franchot Delon RAMAN, CRNAPre-anesthesia Checklist: Patient identified, Emergency Drugs available, Suction available and Patient being monitored Oxygen Delivery Method: Simple face mask Airway Equipment and Method: Bite block Placement Confirmation: positive ETCO2 Dental Injury: Teeth and Oropharynx as per pre-operative assessment

## 2024-06-13 NOTE — Discharge Summary (Shared)
(  36.6 C)   Temp Source Oral  BP 93/63  MAP (mmHg) 73  BP Location Left Arm  BP Method Automatic  Patient Position (if appropriate) Lying  Pulse Rate (!) 53  Pulse Rate Source Monitor  Resp 16  MEWS COLOR  MEWS Score Color Green  Oxygen Therapy  SpO2 100 %  O2 Device Room Ai   VS noted as above.

## 2024-06-13 NOTE — Progress Notes (Signed)
   06/13/24 0015  Vitals  Temp 97.8 F (36.6 C)  Temp Source Oral  BP 93/63  MAP (mmHg) 73  BP Location Left Arm  BP Method Automatic  Patient Position (if appropriate) Lying  Pulse Rate (!) 53  Pulse Rate Source Monitor  Resp 16  MEWS COLOR  MEWS Score Color Green  Oxygen Therapy  SpO2 100 %  O2 Device Room Air

## 2024-06-14 ENCOUNTER — Inpatient Hospital Stay (HOSPITAL_COMMUNITY)

## 2024-06-14 DIAGNOSIS — K922 Gastrointestinal hemorrhage, unspecified: Secondary | ICD-10-CM | POA: Diagnosis not present

## 2024-06-14 LAB — CBC
HCT: 29.8 % — ABNORMAL LOW (ref 39.0–52.0)
Hemoglobin: 8.2 g/dL — ABNORMAL LOW (ref 13.0–17.0)
MCH: 17.1 pg — ABNORMAL LOW (ref 26.0–34.0)
MCHC: 27.5 g/dL — ABNORMAL LOW (ref 30.0–36.0)
MCV: 62.2 fL — ABNORMAL LOW (ref 80.0–100.0)
Platelets: 476 10*3/uL — ABNORMAL HIGH (ref 150–400)
RBC: 4.79 MIL/uL (ref 4.22–5.81)
RDW: 25.8 % — ABNORMAL HIGH (ref 11.5–15.5)
WBC: 6.9 10*3/uL (ref 4.0–10.5)
nRBC: 0 % (ref 0.0–0.2)

## 2024-06-14 LAB — BASIC METABOLIC PANEL WITH GFR
Anion gap: 7 (ref 5–15)
BUN: 12 mg/dL (ref 6–20)
CO2: 24 mmol/L (ref 22–32)
Calcium: 8.6 mg/dL — ABNORMAL LOW (ref 8.9–10.3)
Chloride: 104 mmol/L (ref 98–111)
Creatinine, Ser: 1.02 mg/dL (ref 0.61–1.24)
GFR, Estimated: >60 mL/min (ref >60)
Glucose, Bld: 84 mg/dL (ref 70–99)
Potassium: 4.4 mmol/L (ref 3.5–5.1)
Sodium: 135 mmol/L (ref 135–145)

## 2024-06-14 LAB — SURGICAL PATHOLOGY

## 2024-06-14 MED ORDER — SODIUM CHLORIDE (PF) 0.9 % IJ SOLN
INTRAMUSCULAR | Status: AC
  Filled 2024-06-14: qty 50

## 2024-06-14 MED ORDER — IOHEXOL 300 MG/ML  SOLN
100.0000 mL | Freq: Once | INTRAMUSCULAR | Status: AC | PRN
  Administered 2024-06-14: 100 mL via INTRAVENOUS

## 2024-06-14 MED ORDER — IOHEXOL 9 MG/ML PO SOLN
1000.0000 mL | Freq: Once | ORAL | Status: AC
  Administered 2024-06-14: 1000 mL via ORAL

## 2024-06-14 MED ORDER — IOHEXOL 9 MG/ML PO SOLN
ORAL | Status: AC
  Filled 2024-06-14: qty 1000

## 2024-06-14 NOTE — Progress Notes (Signed)
 [facility] Gastroenterology Progress Note  William Horne 48 y.o. Jun 03, 1976   Subjective: Reports diarrhea overnight.  Denies black stools or hematochezia.  Denies abdominal pain.  Objective: Vital signs: Vitals:   06/13/24 2025 06/14/24 0429  BP: 108/77 109/67  Pulse: 60 (!) 59  Resp: 20 18  Temp: 98.3 F (36.8 C) 97.9 F (36.6 C)  SpO2: 100% 100%    Physical Exam: Gen: alert, no acute distress, well-nourished, pleasant HEENT: anicteric sclera CV: RRR Chest: CTA B Abd: Soft nontender nondistended, positive bowel sounds Ext: no edema  Lab Results: Recent Labs    06/13/24 0109 06/13/24 1308 06/14/24 0755  NA 134* 137 135  K 3.8 4.4 4.4  CL 103 102 104  CO2 24  --  24  GLUCOSE 88 81 84  BUN 13 11 12   CREATININE 0.83 1.10 1.02  CALCIUM 8.2*  --  8.6*   Recent Labs    06/12/24 0756  AST 16  ALT 14  ALKPHOS 56  BILITOT 0.5  PROT 7.5  ALBUMIN 3.7   Recent Labs    06/12/24 0756 06/13/24 0109 06/13/24 1308 06/14/24 0755  WBC 8.4 5.7  --  6.9  NEUTROABS 5.2  --   --   --   HGB 7.3* 6.4* 10.5* 8.2*  HCT 28.1* 24.2* 31.0* 29.8*  MCV 60.7* 58.9*  --  62.2*  PLT 508* 439*  --  476*      Assessment/Plan: Chronic ulcer of the lesser curvature concerning in appearance-CT scan negative for acute findings.  Biopsies consistent with H. pylori gastritis.  Continue IV PPI every 12.  Full liquid diet today and if tolerates okay to do soft diet tomorrow.  Will need outpatient antibiotics for H. Pylori and will need to repeat upper endoscopy in 3 months to confirm resolution of the gastric ulcer.  Consider discharge in next 1 to 2 days.   William Horne 06/14/2024, 9:47 AM  Questions please call [phone] ID: William Horne, male   DOB: [birth year], 48 y.o.   MRN: [id number]

## 2024-06-14 NOTE — Progress Notes (Signed)
 PROGRESS NOTE    William Horne  FMW:[id number] DOB: 10/15/1976 DOA: 06/12/2024 PCP: Patient, No Pcp Per    Brief Narrative:  William Horne is a 48 y.o. male with medical history significant for hepatitis C, gastritis, history of nonbleeding gastric ulcers seen on EGD in 2020 being admitted to the hospital with abdominal pain, vomiting and suspected blood loss anemia.  Patient states that he was in his usual state of health until about a week ago, when he started having intermittent lower chest/upper abdominal pain which radiates into the lower abdomen and sometimes into his low back.  He had some associated nausea, and vomiting in the last 48 hours.  He is not sure if the emesis was bloody or dark.  Starting about a week ago, he noticed that his bowel movements were very black and tarry and loose.  But since have returned to normal.  Plan for EGD on 6/25   Assessment and Plan: Abdominal pain and nausea-with known history of peptic ulcer disease, recent melanotic stools and acute anemia.  Suspicion is for recently bleeding gastric ulcer.  Abdomen is minimally tender to exam, and soft without rebound or guarding.  I have very low suspicion for visceral perforation or other complication. -Avoid blood thinners -GI consult: s/p EGD 6/25, plan for CT scan on 6-26 -Per patient he can be advanced to full liquids so I have done this -Pain and nausea medication as needed -IV PPI   Anemia-with recent melanotic stools and known peptic ulcer disease, likely due to blood loss.   -Status post 1 unit PRBCs to 8.2.  Hypokalemia - Repleted   DVT prophylaxis: SCDs Start: 06/12/24 1008    Code Status: Full Code   Disposition Plan:  Level of care: Med-Surg Status is: Observation     Consultants:  GI   Subjective: Feels well, no complaints  Objective: Vitals:   06/13/24 1445 06/13/24 1528 06/13/24 2025 06/14/24 0429  BP:  120/84 108/77 109/67  Pulse: (!) 50 (!) 47 60 (!) 59   Resp: 15 14 20 18   Temp:  98.2 F (36.8 C) 98.3 F (36.8 C) 97.9 F (36.6 C)  TempSrc:  Oral Oral Oral  SpO2: 100% 100% 100% 100%  Weight:      Height:        Intake/Output Summary (Last 24 hours) at 06/14/2024 0951 Last data filed at 06/13/2024 1413 Gross per 24 hour  Intake 572 ml  Output --  Net 572 ml   Filed Weights   06/12/24 0733 06/12/24 1200  Weight: 79.4 kg 76.4 kg    Examination:   General: Appearance:    Well developed, well nourished male in no acute distress     Lungs:     Clear to auscultation bilaterally, respirations unlabored  Heart:    Bradycardic. Normal rhythm. No murmurs, rubs, or gallops.    MS:   All extremities are intact.    Neurologic:   Awake, alert, oriented x 3. No apparent focal neurological           defect.        Data Reviewed: I have personally reviewed following labs and imaging studies  CBC: Recent Labs  Lab 06/12/24 0756 06/13/24 0109 06/13/24 1308 06/14/24 0755  WBC 8.4 5.7  --  6.9  NEUTROABS 5.2  --   --   --   HGB 7.3* 6.4* 10.5* 8.2*  HCT 28.1* 24.2* 31.0* 29.8*  MCV 60.7* 58.9*  --  62.2*  PLT  508* 439*  --  476*   Basic Metabolic Panel: Recent Labs  Lab 06/12/24 0756 06/13/24 0109 06/13/24 1308 06/14/24 0755  NA 134* 134* 137 135  K 3.4* 3.8 4.4 4.4  CL 101 103 102 104  CO2 24 24  --  24  GLUCOSE 101* 88 81 84  BUN 21* 13 11 12   CREATININE 1.16 0.83 1.10 1.02  CALCIUM 8.8* 8.2*  --  8.6*   GFR: Estimated Creatinine Clearance: 96.7 mL/min (by C-G formula based on SCr of 1.02 mg/dL). Liver Function Tests: Recent Labs  Lab 06/12/24 0756  AST 16  ALT 14  ALKPHOS 56  BILITOT 0.5  PROT 7.5  ALBUMIN 3.7   Recent Labs  Lab 06/12/24 0756  LIPASE 37   No results for input(s): AMMONIA in the last 168 hours. Coagulation Profile: No results for input(s): INR, PROTIME in the last 168 hours. Cardiac Enzymes: No results for input(s): CKTOTAL, CKMB, CKMBINDEX, TROPONINI in the last  168 hours. BNP (last 3 results) No results for input(s): PROBNP in the last 8760 hours. HbA1C: No results for input(s): HGBA1C in the last 72 hours. CBG: No results for input(s): GLUCAP in the last 168 hours. Lipid Profile: No results for input(s): CHOL, HDL, LDLCALC, TRIG, CHOLHDL, LDLDIRECT in the last 72 hours. Thyroid Function Tests: No results for input(s): TSH, T4TOTAL, FREET4, T3FREE, THYROIDAB in the last 72 hours. Anemia Panel: Recent Labs    06/12/24 1050  VITAMINB12 303  FOLATE 10.5  FERRITIN 3*  TIBC 407  IRON 15*  RETICCTPCT 1.3   Sepsis Labs: No results for input(s): PROCALCITON, LATICACIDVEN in the last 168 hours.  No results found for this or any previous visit (from the past 240 hours).       Radiology Studies: CT ABDOMEN PELVIS W CONTRAST Result Date: 06/14/2024 CLINICAL DATA:  Acute abdominal pain.  Chronic hepatitis C. EXAM: CT ABDOMEN AND PELVIS WITH CONTRAST TECHNIQUE: Multidetector CT imaging of the abdomen and pelvis was performed using the standard protocol following bolus administration of intravenous contrast. RADIATION DOSE REDUCTION: This exam was performed according to the departmental dose-optimization program which includes automated exposure control, adjustment of the mA and/or kV according to patient size and/or use of iterative reconstruction technique. CONTRAST:  OMNIPAQUE  IOHEXOL  300 MG/ML  SOLN COMPARISON:  07/01/2022 FINDINGS: Lower Chest: No acute findings. Hepatobiliary: No suspicious hepatic masses identified. No gross morphologic signs of cirrhosis noted. Gallbladder is unremarkable. No evidence of biliary ductal dilatation. Pancreas:  No mass or inflammatory changes. Spleen: Within normal limits in size and appearance. Adrenals/Urinary Tract: No suspicious masses identified. No evidence of ureteral calculi or hydronephrosis. Stomach/Bowel: No evidence of obstruction, inflammatory process or abnormal  fluid collections. Vascular/Lymphatic: No pathologically enlarged lymph nodes. No acute vascular findings. Reproductive:  No mass or other significant abnormality. Other:  None. Musculoskeletal: No suspicious bone lesions identified. Moderate degenerative disc disease incidentally noted at L5-S1. IMPRESSION: No acute findings or other significant abnormality. Electronically Signed   By: Norleen DELENA Kil M.D.   On: 06/14/2024 09:32        Scheduled Meds:  feeding supplement  1 Container Oral TID BM   pantoprazole  (PROTONIX ) IV  40 mg Intravenous Q12H   pneumococcal 20-valent conjugate vaccine  0.5 mL Intramuscular Tomorrow-1000   Continuous Infusions:     LOS: 1 day    Time spent: 45 minutes spent on chart review, discussion with nursing staff, consultants, updating family and interview/physical exam; more than 50% of that time  was spent in counseling and/or coordination of care.    Harlene RAYMOND Bowl, DO [facility] Available via Epic secure chat 7am-7pm After these hours, please refer to coverage provider listed on [email or web address] 06/14/2024, 9:51 AM

## 2024-06-15 ENCOUNTER — Other Ambulatory Visit (HOSPITAL_COMMUNITY): Payer: Self-pay

## 2024-06-15 ENCOUNTER — Telehealth (HOSPITAL_COMMUNITY): Payer: Self-pay | Admitting: Pharmacy Technician

## 2024-06-15 ENCOUNTER — Encounter (HOSPITAL_COMMUNITY): Payer: Self-pay | Admitting: Gastroenterology

## 2024-06-15 ENCOUNTER — Other Ambulatory Visit (HOSPITAL_BASED_OUTPATIENT_CLINIC_OR_DEPARTMENT_OTHER): Payer: Self-pay

## 2024-06-15 DIAGNOSIS — K922 Gastrointestinal hemorrhage, unspecified: Secondary | ICD-10-CM | POA: Diagnosis not present

## 2024-06-15 LAB — HEMOGLOBIN AND HEMATOCRIT, BLOOD
HCT: 30.7 % — ABNORMAL LOW (ref 39.0–52.0)
Hemoglobin: 8.1 g/dL — ABNORMAL LOW (ref 13.0–17.0)

## 2024-06-15 LAB — BASIC METABOLIC PANEL WITH GFR
Anion gap: 8 (ref 5–15)
BUN: 10 mg/dL (ref 6–20)
CO2: 23 mmol/L (ref 22–32)
Calcium: 8.8 mg/dL — ABNORMAL LOW (ref 8.9–10.3)
Chloride: 107 mmol/L (ref 98–111)
Creatinine, Ser: 0.96 mg/dL (ref 0.61–1.24)
GFR, Estimated: >60 mL/min (ref >60)
Glucose, Bld: 82 mg/dL (ref 70–99)
Potassium: 4.6 mmol/L (ref 3.5–5.1)
Sodium: 138 mmol/L (ref 135–145)

## 2024-06-15 LAB — CBC
HCT: 29.8 % — ABNORMAL LOW (ref 39.0–52.0)
Hemoglobin: 8 g/dL — ABNORMAL LOW (ref 13.0–17.0)
MCH: 16.9 pg — ABNORMAL LOW (ref 26.0–34.0)
MCHC: 26.8 g/dL — ABNORMAL LOW (ref 30.0–36.0)
MCV: 63 fL — ABNORMAL LOW (ref 80.0–100.0)
Platelets: 442 10*3/uL — ABNORMAL HIGH (ref 150–400)
RBC: 4.73 MIL/uL (ref 4.22–5.81)
RDW: 26.1 % — ABNORMAL HIGH (ref 11.5–15.5)
WBC: 5.7 10*3/uL (ref 4.0–10.5)
nRBC: 0 % (ref 0.0–0.2)

## 2024-06-15 MED ORDER — BISMUTH SUBSALICYLATE 262 MG PO CHEW
524.0000 mg | CHEWABLE_TABLET | Freq: Four times a day (QID) | ORAL | Status: DC
  Filled 2024-06-15: qty 2

## 2024-06-15 MED ORDER — ESOMEPRAZOLE MAGNESIUM 20 MG PO CPDR
20.0000 mg | DELAYED_RELEASE_CAPSULE | Freq: Two times a day (BID) | ORAL | 0 refills | Status: DC
  Filled 2024-06-15: qty 28, 14d supply, fill #0

## 2024-06-15 MED ORDER — TETRACYCLINE HCL 500 MG PO CAPS
500.0000 mg | ORAL_CAPSULE | Freq: Four times a day (QID) | ORAL | 0 refills | Status: DC
  Filled 2024-06-15: qty 20, 5d supply, fill #0
  Filled 2024-06-15: qty 36, 9d supply, fill #0

## 2024-06-15 MED ORDER — TETRACYCLINE HCL 250 MG PO CAPS
500.0000 mg | ORAL_CAPSULE | Freq: Four times a day (QID) | ORAL | Status: DC
  Filled 2024-06-15: qty 2

## 2024-06-15 MED ORDER — BISMUTH SUBSALICYLATE 262 MG PO CHEW
524.0000 mg | CHEWABLE_TABLET | Freq: Four times a day (QID) | ORAL | 0 refills | Status: DC
  Filled 2024-06-15: qty 120, 15d supply, fill #0

## 2024-06-15 MED ORDER — METRONIDAZOLE 500 MG PO TABS: 500.0000 mg | ORAL_TABLET | Freq: Three times a day (TID) | ORAL | Status: DC

## 2024-06-15 MED ORDER — METRONIDAZOLE 500 MG PO TABS
500.0000 mg | ORAL_TABLET | Freq: Three times a day (TID) | ORAL | 0 refills | Status: DC
  Filled 2024-06-15: qty 42, 14d supply, fill #0

## 2024-06-15 MED ORDER — ORAL CARE MOUTH RINSE: 15.0000 mL | OROMUCOSAL | Status: DC | PRN

## 2024-06-15 MED ORDER — ESOMEPRAZOLE MAGNESIUM 20 MG PO CPDR
20.0000 mg | DELAYED_RELEASE_CAPSULE | Freq: Two times a day (BID) | ORAL | Status: DC
  Filled 2024-06-15: qty 1

## 2024-06-15 NOTE — Plan of Care (Signed)
 Pt received, educated on, and understands AVS discharge summary.  All IV accesses Dc'd.  Pt has all belongings.  Awaiting on medication meds to bed

## 2024-06-15 NOTE — Discharge Summary (Signed)
 Physician Discharge Summary  Kamsiyochukwu Spickler FMW:[id number] DOB: 1976-10-03 DOA: 06/12/2024  PCP: Patient, No Pcp Per  Admit date: 06/12/2024 Discharge date: 06/15/2024  Admitted From: Home Discharge disposition: Home   Recommendations for Outpatient Follow-Up:   Start Pylera as outpt. PPI PO BID. F/U with GI in 4-6 weeks and will need repeat EGD in 3-4 months to verify healing  CBC at next office visit   Discharge Diagnosis:   Principal Problem:   Upper GI bleed Active Problems:   Anemia    Discharge Condition: Improved.  Diet recommendation: Regular.  Wound care: None.  Code status: Full.   History of Present Illness:   William Horne is a 48 y.o. male with medical history significant for hepatitis C, gastritis, history of nonbleeding gastric ulcers seen on EGD in 2020 being admitted to the hospital with abdominal pain, vomiting and suspected blood loss anemia.  Patient states that he was in his usual state of health until about a week ago, when he started having intermittent lower chest/upper abdominal pain which radiates into the lower abdomen and sometimes into his low back.  He had some associated nausea, and vomiting in the last 48 hours.  He is not sure if the emesis was bloody or dark.  Starting about a week ago, he noticed that his bowel movements were very black and tarry and loose.  However now they have become normal in color, and are hard.  He denies any dizziness or lightheadedness, fevers, chills.  He is not on any blood thinners, and denies any NSAID use.  He has continued to use marijuana intermittently, but denies any illicit drug use.    Hospital Course by Problem:   Abdominal pain and nausea-with known history of peptic ulcer disease, recent melanotic stools and acute anemia.  Suspicion is for recently bleeding gastric ulcer.  Abdomen is minimally tender to exam, and soft without rebound or guarding.  I have very low suspicion for visceral  perforation or other complication. -GI consult: s/p EGD 6/25,  CT scan on 6-26 normal - Soft diet -Start Pylera as outpt. PPI PO BID. F/U with GI in 4-6 weeks and will need repeat EGD in 3-4 months to verify healing   Anemia-with recent melanotic stools and known peptic ulcer disease, likely due to blood loss.   -Status post 1 unit PRBCs improved into the eights and stable   Hypokalemia - Repleted    Medical Consultants:   GI   Discharge Exam:   Vitals:   06/15/24 0414 06/15/24 1202  BP: 100/69 108/68  Pulse: (!) 54 67  Resp: 18 18  Temp: 97.6 F (36.4 C) 98.4 F (36.9 C)  SpO2: 100% 100%   Vitals:   06/14/24 1302 06/14/24 1943 06/15/24 0414 06/15/24 1202  BP: 111/62 108/66 100/69 108/68  Pulse: 60 75 (!) 54 67  Resp:  18 18 18   Temp: 98 F (36.7 C) 98.6 F (37 C) 97.6 F (36.4 C) 98.4 F (36.9 C)  TempSrc: Oral     SpO2: 100% 100% 100% 100%  Weight:      Height:        General exam: Appears calm and comfortable.  45 minutes   The results of significant diagnostics from this hospitalization (including imaging, microbiology, ancillary and laboratory) are listed below for reference.     Procedures and Diagnostic Studies:   DG Chest 2 View Result Date: 06/12/2024 CLINICAL DATA:  Chest pain. EXAM: CHEST - 2  VIEW COMPARISON:  July 29, 2023. FINDINGS: The heart size and mediastinal contours are within normal limits. Both lungs are clear. The visualized skeletal structures are unremarkable. IMPRESSION: No active cardiopulmonary disease. Electronically Signed   By: Lynwood Landy Raddle M.D.   On: 06/12/2024 08:12     Labs:   Basic Metabolic Panel: Recent Labs  Lab 06/12/24 0756 06/13/24 0109 06/13/24 1308 06/14/24 0755 06/15/24 0408  NA 134* 134* 137 135 138  K 3.4* 3.8 4.4 4.4 4.6  CL 101 103 102 104 107  CO2 24 24  --  24 23  GLUCOSE 101* 88 81 84 82  BUN 21* 13 11 12 10   CREATININE 1.16 0.83 1.10 1.02 0.96  CALCIUM 8.8* 8.2*  --  8.6* 8.8*    GFR Estimated Creatinine Clearance: 102.8 mL/min (by C-G formula based on SCr of 0.96 mg/dL). Liver Function Tests: Recent Labs  Lab 06/12/24 0756  AST 16  ALT 14  ALKPHOS 56  BILITOT 0.5  PROT 7.5  ALBUMIN 3.7   Recent Labs  Lab 06/12/24 0756  LIPASE 37   No results for input(s): AMMONIA in the last 168 hours. Coagulation profile No results for input(s): INR, PROTIME in the last 168 hours.  CBC: Recent Labs  Lab 06/12/24 0756 06/13/24 0109 06/13/24 1308 06/14/24 0755 06/15/24 0408 06/15/24 0954  WBC 8.4 5.7  --  6.9 5.7  --   NEUTROABS 5.2  --   --   --   --   --   HGB 7.3* 6.4* 10.5* 8.2* 8.0* 8.1*  HCT 28.1* 24.2* 31.0* 29.8* 29.8* 30.7*  MCV 60.7* 58.9*  --  62.2* 63.0*  --   PLT 508* 439*  --  476* 442*  --    Cardiac Enzymes: No results for input(s): CKTOTAL, CKMB, CKMBINDEX, TROPONINI in the last 168 hours. BNP: Invalid input(s): POCBNP CBG: No results for input(s): GLUCAP in the last 168 hours. D-Dimer No results for input(s): DDIMER in the last 72 hours. Hgb A1c No results for input(s): HGBA1C in the last 72 hours. Lipid Profile No results for input(s): CHOL, HDL, LDLCALC, TRIG, CHOLHDL, LDLDIRECT in the last 72 hours. Thyroid function studies No results for input(s): TSH, T4TOTAL, T3FREE, THYROIDAB in the last 72 hours.  Invalid input(s): FREET3 Anemia work up No results for input(s): VITAMINB12, FOLATE, FERRITIN, TIBC, IRON, RETICCTPCT in the last 72 hours. Microbiology No results found for this or any previous visit (from the past 240 hours).   Discharge Instructions:   Discharge Instructions     Discharge instructions   Complete by: As directed    Soft diet follow-up with GI in 4 to 6 weeks and we will arrange repeat endoscopy in 3-4 months to verify healing of ulcer.   Increase activity slowly   Complete by: As directed       Allergies as of 06/15/2024       Reactions    Asa [aspirin] Nausea And Vomiting        Medication List     STOP taking these medications    famotidine  20 MG tablet Commonly known as: Pepcid        TAKE these medications    bismuth subsalicylate 262 MG chewable tablet Commonly known as: PEPTO BISMOL Chew 2 tablets (524 mg total) by mouth 4 (four) times daily.   esomeprazole 20 MG capsule Commonly known as: NEXIUM Take 1 capsule (20 mg total) by mouth 2 (two) times daily before a meal for 14 days.  metroNIDAZOLE 500 MG tablet Commonly known as: FLAGYL Take 1 tablet (500 mg total) by mouth 3 (three) times daily with meals for 14 days.   tetracycline 500 MG capsule Commonly known as: SUMYCIN Take 1 capsule (500 mg total) by mouth 4 (four) times daily for 14 days.          Time coordinating discharge: 45 minutes  Signed:  Harlene RAYMOND Bowl DO  [facility] 06/15/2024, 12:39 PM

## 2024-06-15 NOTE — Telephone Encounter (Signed)
 Pharmacy Patient Advocate Encounter  Received notification from [facility] MEDICAID that Prior Authorization for Tetracycline HCl 500MG  capsules  has been APPROVED from 06/15/2024 to 06/15/2025   PA #/Case ID/Reference #: [id number]

## 2024-06-15 NOTE — Progress Notes (Addendum)
 1405- pt to discharge lounge to wait for discharge medications. Primary nurse verbally informed pt that meds would not be available until after 1430. Meds needed prior authorization and had to be delivered from [facility] - delivery was due to arrive at 1430. This RN attempted to deliver TOC meds in a secure bag to pt in discharge area at 1445. Pt was not in discharge area and had not been signed in to the log. This RN checked the main lobby for patient, per volunteer, pt had left a few minutes ago via shuttle to car. This RN attempted to call patient x 2. Phone 3 was no longer in service. TOC meds in a secure bag returned to [employer] [facility]. 33- Per outpatient pharmacy they have also not been able to reach pt via phone listed in chart. Pt has not picked up discharge meds

## 2024-06-15 NOTE — Telephone Encounter (Signed)
 Patient Product/process development scientist completed.    The patient is insured through [facility] MEDICAID.     Ran test claim for Pylera 140-125-125 mg and the current 30 day co-pay is $4.00.  Ran test claim for Bimuth/Metronidaz/Tetracyclin 140-125-125 mg and Product Not Covered  This test claim was processed through [employer]- copay amounts may vary at other pharmacies due to [employer], or as the patient moves through the different stages of their insurance plan.     Reyes Sharps, CPHT Pharmacy Technician III Certified Patient Advocate [facility] Pharmacy Patient Advocate Team Direct Number: [phone]  Fax: [phone]

## 2024-06-15 NOTE — Plan of Care (Signed)

## 2024-06-15 NOTE — Progress Notes (Signed)
 [facility] Gastroenterology Progress Note  William Horne 48 y.o. 1976-02-28   Subjective: Feels good. Tolerating liquid diet. Denies abdominal pain.  Objective: Vital signs: Vitals:   06/14/24 1943 06/15/24 0414  BP: 108/66 100/69  Pulse: 75 (!) 54  Resp: 18 18  Temp: 98.6 F (37 C) 97.6 F (36.4 C)  SpO2: 100% 100%    Physical Exam: Gen: alert, no acute distress, thin HEENT: anicteric sclera CV: RRR Chest: CTA B Abd: soft, nontender, nondistended, +BS Ext: no edema  Lab Results: Recent Labs    06/14/24 0755 06/15/24 0408  NA 135 138  K 4.4 4.6  CL 104 107  CO2 24 23  GLUCOSE 84 82  BUN 12 10  CREATININE 1.02 0.96  CALCIUM 8.6* 8.8*   No results for input(s): AST, ALT, ALKPHOS, BILITOT, PROT, ALBUMIN in the last 72 hours. Recent Labs    06/14/24 0755 06/15/24 0408 06/15/24 0954  WBC 6.9 5.7  --   HGB 8.2* 8.0* 8.1*  HCT 29.8* 29.8* 30.7*  MCV 62.2* 63.0*  --   PLT 476* 442*  --       Assessment/Plan: Chronic gastric ulcer - biopsies showed H. Pylori gastritis. Start Pylera as outpt. PPI PO BID. F/U with me in 4-6 weeks and will need repeat EGD in 3-4 months to verify healing (our office will arrange). Ok to go home today if tolerates soft diet for lunch.   William Horne 06/15/2024, 11:56 AM  Questions please call [phone] ID: William Horne, male   DOB: 25-Aug-1976, 48 y.o.   MRN: [id number]

## 2024-06-15 NOTE — Telephone Encounter (Signed)
 Pharmacy Patient Advocate Encounter   Received notification from Inpatient Request that prior authorization for Tetracycline HCl 500MG  capsules is required/requested.   Insurance verification completed.   The patient is insured through [facility] MEDICAID .   Per test claim: PA required; PA submitted to above mentioned insurance via CoverMyMeds Key/confirmation #/EOC [id number] Status is pending

## 2024-06-17 LAB — BPAM RBC
Blood Product Expiration Date: 202507222359
Blood Product Expiration Date: 202507222359
Blood Product Expiration Date: 202507232359
ISSUE DATE / TIME: 202506250925
Unit Type and Rh: 5100
Unit Type and Rh: 5100
Unit Type and Rh: 5100

## 2024-06-17 LAB — TYPE AND SCREEN
ABO/RH(D): O POS
Antibody Screen: NEGATIVE
Unit division: 0
Unit division: 0
Unit division: 0

## 2024-06-19 ENCOUNTER — Other Ambulatory Visit (HOSPITAL_COMMUNITY): Payer: Self-pay

## 2024-06-24 ENCOUNTER — Observation Stay (HOSPITAL_COMMUNITY)
Admission: EM | Admit: 2024-06-24 | Discharge: 2024-06-26 | Disposition: A | Discharge status: Home / Self Care | Attending: Family Medicine | Admitting: Family Medicine

## 2024-06-24 ENCOUNTER — Other Ambulatory Visit: Payer: Self-pay

## 2024-06-24 ENCOUNTER — Emergency Department (HOSPITAL_COMMUNITY)

## 2024-06-24 ENCOUNTER — Encounter (HOSPITAL_COMMUNITY): Payer: Self-pay | Admitting: *Deleted

## 2024-06-24 DIAGNOSIS — B9681 Helicobacter pylori [H. pylori] as the cause of diseases classified elsewhere: Secondary | ICD-10-CM | POA: Diagnosis not present

## 2024-06-24 DIAGNOSIS — D62 Acute posthemorrhagic anemia: Principal | ICD-10-CM | POA: Insufficient documentation

## 2024-06-24 DIAGNOSIS — R079 Chest pain, unspecified: Secondary | ICD-10-CM | POA: Diagnosis present

## 2024-06-24 DIAGNOSIS — Z79899 Other long term (current) drug therapy: Secondary | ICD-10-CM | POA: Diagnosis not present

## 2024-06-24 DIAGNOSIS — D649 Anemia, unspecified: Principal | ICD-10-CM | POA: Diagnosis present

## 2024-06-24 DIAGNOSIS — K259 Gastric ulcer, unspecified as acute or chronic, without hemorrhage or perforation: Secondary | ICD-10-CM | POA: Insufficient documentation

## 2024-06-24 LAB — CBC WITH DIFFERENTIAL/PLATELET
Abs Immature Granulocytes: 0.06 K/uL (ref 0.00–0.07)
Basophils Absolute: 0 K/uL (ref 0.0–0.1)
Basophils Relative: 0 %
Eosinophils Absolute: 0.2 K/uL (ref 0.0–0.5)
Eosinophils Relative: 2 %
HCT: 27.5 % — ABNORMAL LOW (ref 39.0–52.0)
Hemoglobin: 7.5 g/dL — ABNORMAL LOW (ref 13.0–17.0)
Immature Granulocytes: 1 %
Lymphocytes Relative: 24 %
Lymphs Abs: 2.4 K/uL (ref 0.7–4.0)
MCH: 16.8 pg — ABNORMAL LOW (ref 26.0–34.0)
MCHC: 27.3 g/dL — ABNORMAL LOW (ref 30.0–36.0)
MCV: 61.5 fL — ABNORMAL LOW (ref 80.0–100.0)
Monocytes Absolute: 0.9 K/uL (ref 0.1–1.0)
Monocytes Relative: 9 %
Neutro Abs: 6.2 K/uL (ref 1.7–7.7)
Neutrophils Relative %: 64 %
Platelets: 432 K/uL — ABNORMAL HIGH (ref 150–400)
RBC: 4.47 MIL/uL (ref 4.22–5.81)
RDW: 26.9 % — ABNORMAL HIGH (ref 11.5–15.5)
WBC: 9.8 K/uL (ref 4.0–10.5)
nRBC: 0 % (ref 0.0–0.2)

## 2024-06-24 LAB — BASIC METABOLIC PANEL WITH GFR
Anion gap: 7 (ref 5–15)
BUN: 7 mg/dL (ref 6–20)
CO2: 29 mmol/L (ref 22–32)
Calcium: 8.8 mg/dL — ABNORMAL LOW (ref 8.9–10.3)
Chloride: 103 mmol/L (ref 98–111)
Creatinine, Ser: 0.99 mg/dL (ref 0.61–1.24)
GFR, Estimated: >60 mL/min (ref >60)
Glucose, Bld: 108 mg/dL — ABNORMAL HIGH (ref 70–99)
Potassium: 3.5 mmol/L (ref 3.5–5.1)
Sodium: 139 mmol/L (ref 135–145)

## 2024-06-24 LAB — HEMOGLOBIN AND HEMATOCRIT, BLOOD
HCT: 26.5 % — ABNORMAL LOW (ref 39.0–52.0)
Hemoglobin: 7.3 g/dL — ABNORMAL LOW (ref 13.0–17.0)

## 2024-06-24 LAB — TROPONIN I (HIGH SENSITIVITY)
Troponin I (High Sensitivity): 5 ng/L (ref <18)
Troponin I (High Sensitivity): 4 ng/L (ref <18)

## 2024-06-24 LAB — LIPASE, BLOOD: Lipase: 25 U/L (ref 11–51)

## 2024-06-24 MED ORDER — TRAZODONE HCL 50 MG PO TABS
25.0000 mg | ORAL_TABLET | Freq: Every evening | ORAL | Status: DC | PRN
  Administered 2024-06-25: 25 mg via ORAL
  Filled 2024-06-24: qty 1

## 2024-06-24 MED ORDER — ONDANSETRON HCL 4 MG/2ML IJ SOLN
4.0000 mg | Freq: Four times a day (QID) | INTRAMUSCULAR | Status: DC | PRN
  Administered 2024-06-25: 4 mg via INTRAVENOUS
  Filled 2024-06-24: qty 2

## 2024-06-24 MED ORDER — HYDRALAZINE HCL 20 MG/ML IJ SOLN: 10.0000 mg | Freq: Four times a day (QID) | INTRAMUSCULAR | Status: DC | PRN

## 2024-06-24 MED ORDER — ACETAMINOPHEN 650 MG RE SUPP
650.0000 mg | Freq: Four times a day (QID) | RECTAL | Status: DC | PRN
Start: 2024-06-24 — End: 2024-06-26

## 2024-06-24 MED ORDER — ACETAMINOPHEN 325 MG PO TABS: 650.0000 mg | ORAL_TABLET | Freq: Four times a day (QID) | ORAL | Status: DC | PRN

## 2024-06-24 MED ORDER — PANTOPRAZOLE SODIUM 40 MG PO TBEC
40.0000 mg | DELAYED_RELEASE_TABLET | Freq: Two times a day (BID) | ORAL | Status: DC
  Administered 2024-06-25 – 2024-06-26 (×4): 40 mg via ORAL
  Filled 2024-06-24 (×5): qty 1

## 2024-06-24 MED ORDER — NICOTINE 21 MG/24HR TD PT24
21.0000 mg | MEDICATED_PATCH | Freq: Every day | TRANSDERMAL | Status: DC
  Administered 2024-06-24 – 2024-06-26 (×3): 21 mg via TRANSDERMAL
  Filled 2024-06-24 (×3): qty 1

## 2024-06-24 MED ORDER — ONDANSETRON HCL 4 MG/2ML IJ SOLN
4.0000 mg | Freq: Once | INTRAMUSCULAR | Status: AC
  Administered 2024-06-24: 4 mg via INTRAVENOUS
  Filled 2024-06-24: qty 2

## 2024-06-24 MED ORDER — TETRACYCLINE HCL 250 MG PO CAPS
500.0000 mg | ORAL_CAPSULE | Freq: Four times a day (QID) | ORAL | Status: DC
  Administered 2024-06-25 – 2024-06-26 (×7): 500 mg via ORAL
  Filled 2024-06-24 (×9): qty 2

## 2024-06-24 MED ORDER — METRONIDAZOLE 500 MG PO TABS
500.0000 mg | ORAL_TABLET | Freq: Three times a day (TID) | ORAL | Status: DC
  Administered 2024-06-24 – 2024-06-26 (×6): 500 mg via ORAL
  Filled 2024-06-24 (×6): qty 1

## 2024-06-24 MED ORDER — BISMUTH SUBSALICYLATE 262 MG PO CHEW
524.0000 mg | CHEWABLE_TABLET | Freq: Four times a day (QID) | ORAL | Status: DC
  Administered 2024-06-25 – 2024-06-26 (×7): 524 mg via ORAL
  Filled 2024-06-24 (×9): qty 2

## 2024-06-24 MED ORDER — ONDANSETRON HCL 4 MG PO TABS: 4.0000 mg | ORAL_TABLET | Freq: Four times a day (QID) | ORAL | Status: DC | PRN

## 2024-06-24 NOTE — Assessment & Plan Note (Signed)
 Patient with persistent acute blood loss anemia likely related to miscommunication regarding his medications. -Resume previous medications to include bismuth  subsalicylate, Nexium , metronidazole  and tetracycline . -Check H&H every 4 hours x 4 if hemoglobin stable patient can discharge home with education such that he knows how to and where to pick up his medications. -If hemoglobin drops to below 7 he will need a transfusion of packed red blood cells.  Type and screen has been sent.

## 2024-06-24 NOTE — Assessment & Plan Note (Signed)
 Patient with gastric ulcer due to H. pylori.  Resume treatment as above.

## 2024-06-24 NOTE — ED Provider Triage Note (Signed)
 Emergency Medicine Provider Triage Evaluation Note  William Horne , a 48 y.o. male  was evaluated in triage.  Pt complains of short episode of chest pain yeserday then one episode of vomiting. Has not had any since. He denies belly pain, fever, or SOB. He was anxious as he had symptoms like this previously and was recently admitted. Asymptomatic now. He denies black or bloody stools.   Review of Systems  Positive:  Negative:   Physical Exam  BP (!) 142/94 (BP Location: Right Arm)   Pulse 65   Temp 98.1 F (36.7 C) (Oral)   Resp 18   Wt 76.2 kg   SpO2 99%   BMI 20.45 kg/m  Gen:   Awake, no distress   Resp:  Normal effort  MSK:   Moves extremities without difficulty  Other:    Medical Decision Making  Medically screening exam initiated at 5:45 PM.  Appropriate orders placed.  William Horne was informed that the remainder of the evaluation will be completed by another provider, this initial triage assessment does not replace that evaluation, and the importance of remaining in the ED until their evaluation is complete.  Labs ordered   William Horne, [place] 06/24/24 1746

## 2024-06-24 NOTE — ED Triage Notes (Signed)
 Here by POV from home for re-check. Was here last week for blood transfusion. Some sx have returned and wants to be rechecked.  Endorses chest hurts, light headed, and vomited yesterday x1. Denies nausea, diarrhea, bleeding, sob, syncope, fall, fever, or other pain. Alert, NAD, calm, interactive, resps e/u. Conjuctiva pink. Cap refill <2sec. Skin W&D.

## 2024-06-24 NOTE — H&P (Signed)
 History and Physical    Patient: William Horne FMW:[id number] DOB: November 29, 1976 DOA: 06/24/2024 DOS: the patient was seen and examined on 06/24/2024 PCP: Patient, No Pcp Per  Patient coming from: Home  Chief Complaint:  Chief Complaint  Patient presents with   Follow-up   Chest Pain   HPI: William Horne is a 48 y.o. male with medical history significant of recent upper GI bleed due to gastric ulcer caused by H. pylori hepatitis C infection and eczema who presents to the emergency department for recheck of his hemoglobin.  On 25 June he had an EGD which showed the gastric ulcer and later came back as H. pylori positive.  He was discharged from the hospital on June 27 with a medication regimen to treat the H. pylori including bismuth  subsalicylate, Nexium , metronidazole , and tetracycline .  Patient was under the impression that this medication was going to be delivered to him at the hospital when it did not, he left without it.  According to the after visit summary these medications were sent to the [facility].  Patient seemed to think it was going to be delivered to his hospital room.  Therefore since discharge he has not been taking any of these medications.  He presents today feeling weak and not so well.  His hemoglobin is down to 7.5. Hospital medicine was asked to monitor the patient's H&H and if he dropped to less than 7 to arrange for transfusion.  Patient to be placed in hospital observation status.    Review of Systems: As mentioned in the history of present illness. All other systems reviewed and are negative. Past Medical History:  Diagnosis Date   Dental caries    Eczema    Gastritis    Hepatitis C    Shoulder dislocation    Past Surgical History:  Procedure Laterality Date   BIOPSY  07/18/2019   Procedure: BIOPSY;  Surgeon: Dianna Specking, MD;  Location: [facility];  Service: Endoscopy;;   ESOPHAGOGASTRODUODENOSCOPY N/A 06/13/2024   Procedure: EGD  (ESOPHAGOGASTRODUODENOSCOPY);  Surgeon: Dianna Specking, MD;  Location: [facility];  Service: Gastroenterology;  Laterality: N/A;   ESOPHAGOGASTRODUODENOSCOPY (EGD) WITH PROPOFOL  N/A 07/18/2019   Procedure: ESOPHAGOGASTRODUODENOSCOPY (EGD) WITH PROPOFOL ;  Surgeon: Dianna Specking, MD;  Location: [facility];  Service: Endoscopy;  Laterality: N/A;   Social History:  reports that he has been smoking cigarettes. He has a 45 pack-year smoking history. He quit smokeless tobacco use about 14 years ago. He reports current alcohol use. He reports current drug use. Drug: Marijuana.  Allergies  Allergen Reactions   Asa [Aspirin] Nausea And Vomiting    Family History  Problem Relation Age of Onset   Rheum arthritis Mother    Osteoarthritis Mother    Hypertension Father    Migraines Sister     Prior to Admission medications   Medication Sig Start Date End Date Taking? Authorizing Provider  bismuth  subsalicylate (PEPTO BISMOL) 262 MG chewable tablet Chew 2 tablets (524 mg total) by mouth 4 (four) times daily. 06/15/24   Vann, Jessica U, DO  esomeprazole  (NEXIUM ) 20 MG capsule Take 1 capsule (20 mg total) by mouth 2 (two) times daily before a meal for 14 days. 06/15/24 06/29/24  Vann, Jessica U, DO  metroNIDAZOLE  (FLAGYL ) 500 MG tablet Take 1 tablet (500 mg total) by mouth 3 (three) times daily with meals for 14 days. 06/15/24 06/29/24  Vann, Jessica U, DO  tetracycline  (SUMYCIN ) 500 MG capsule Take 1 capsule (500 mg total) by  mouth 4 (four) times daily for 14 days. 06/15/24 06/29/24  Juvenal Harlene PENNER, DO    Physical Exam: Vitals:   06/24/24 1657 06/24/24 1732 06/24/24 2000  BP: (!) 142/94  137/88  Pulse: 65  (!) 46  Resp: 18  18  Temp: 98.1 F (36.7 C)    TempSrc: Oral    SpO2: 99%  100%  Weight:  76.2 kg    Eyes: PERRL, lids and conjunctivae pale ENMT: Mucous membranes are moist. Posterior pharynx clear of any exudate or lesions.Normal dentition.  Neck: normal, supple, no masses, no  thyromegaly Respiratory: clear to auscultation bilaterally, no wheezing, no crackles. Normal respiratory effort. No accessory muscle use.  Cardiovascular: Regular rate and rhythm, no murmurs / rubs / gallops. No extremity edema. 2+ pedal pulses. No carotid bruits.  Abdomen: no tenderness, no masses palpated. No hepatosplenomegaly. Bowel sounds positive.  Musculoskeletal: no clubbing / cyanosis. No joint deformity upper and lower extremities. Good ROM, no contractures. Normal muscle tone.  Skin: no rashes, lesions, ulcers. No induration Neurologic: CN 2-12 grossly intact. Sensation intact, DTR normal. Strength 5/5 in all 4.  Psychiatric: Normal judgment and insight. Alert and oriented x 3. Normal mood.   Data Reviewed:  Hemoglobin 7.5 down from 8.1 on admission.  Assessment and Plan: * Acute blood loss anemia Patient with persistent acute blood loss anemia likely related to miscommunication regarding his medications. -Resume previous medications to include bismuth  subsalicylate, Nexium , metronidazole  and tetracycline . -Check H&H every 4 hours x 4 if hemoglobin stable patient can discharge home with education such that he knows how to and where to pick up his medications. -If hemoglobin drops to below 7 he will need a transfusion of packed red blood cells.  Type and screen has been sent.  H pylori ulcer As noted above.  Gastric ulcer, unspecified as acute or chronic, without hemorrhage or perforation Patient with gastric ulcer due to H. pylori.  Resume treatment as above.      Advance Care Planning:   Code Status: Full Code   Consults: None  Family Communication: No family present at the time of admission.  Patient retains capacity.  Severity of Illness: The appropriate patient status for this patient is OBSERVATION. Observation status is judged to be reasonable and necessary in order to provide the required intensity of service to ensure the patient's safety. The patient's  presenting symptoms, physical exam findings, and initial radiographic and laboratory data in the context of their medical condition is felt to place them at decreased risk for further clinical deterioration. Furthermore, it is anticipated that the patient will be medically stable for discharge from the hospital within 2 midnights of admission.   Author: Zebedee JAYSON Fujisawa, MD, FACP 06/24/2024 9:40 PM  For on call review [email or web address].

## 2024-06-24 NOTE — Assessment & Plan Note (Signed)
 As noted above

## 2024-06-24 NOTE — ED Provider Notes (Signed)
 [place] EMERGENCY DEPARTMENT AT [facility] Provider Note   CSN: [id number] Arrival date & time: 06/24/24  1647     Patient presents with: Follow-up and Chest Pain   William Horne is a 48 y.o. male.   48 year old male presenting with 2 episodes of vomiting.  Patient was recently hospitalized for anemia requiring blood transfusion, upper endoscopy was performed and patient was told he had gastric ulcers.  Patient had 1 episode of vomiting last night and another episode in the waiting room of the emergency department this evening, describes associated chest pain/shortness of breath with episodes of vomiting, denies hematemesis/coffee-ground emesis, melena/hematochezia.  Patient was told to take Pepto-Bismol and acid reducer medication at time of recent hospital discharge, he admits that he has not taken these medications for several days.  No syncope, fever.   Chest Pain      Prior to Admission medications   Medication Sig Start Date End Date Taking? Authorizing Provider  bismuth  subsalicylate (PEPTO BISMOL) 262 MG chewable tablet Chew 2 tablets (524 mg total) by mouth 4 (four) times daily. 06/15/24   Vann, Jessica U, DO  esomeprazole  (NEXIUM ) 20 MG capsule Take 1 capsule (20 mg total) by mouth 2 (two) times daily before a meal for 14 days. 06/15/24 06/29/24  Vann, Jessica U, DO  metroNIDAZOLE  (FLAGYL ) 500 MG tablet Take 1 tablet (500 mg total) by mouth 3 (three) times daily with meals for 14 days. 06/15/24 06/29/24  Vann, Jessica U, DO  tetracycline  (SUMYCIN ) 500 MG capsule Take 1 capsule (500 mg total) by mouth 4 (four) times daily for 14 days. 06/15/24 06/29/24  Vann, Jessica U, DO    Allergies: Asa [aspirin]    Review of Systems  Cardiovascular:  Positive for chest pain.    Updated Vital Signs BP (!) 142/94 (BP Location: Right Arm)   Pulse 65   Temp 98.1 F (36.7 C) (Oral)   Resp 18   Wt 76.2 kg   SpO2 99%   BMI 20.45 kg/m   Physical Exam Vitals and  nursing note reviewed.  HENT:     Head: Normocephalic.  Eyes:     Extraocular Movements: Extraocular movements intact.     Pupils: Pupils are equal, round, and reactive to light.  Cardiovascular:     Rate and Rhythm: Normal rate and regular rhythm.  Pulmonary:     Effort: Pulmonary effort is normal.     Breath sounds: Normal breath sounds.  Abdominal:     Palpations: Abdomen is soft.     Tenderness: There is abdominal tenderness. There is no guarding.     Comments: Mild epigastric TTP  Musculoskeletal:     Cervical back: Normal range of motion.     Comments: Moves all extremities spontaneously without difficulty  Skin:    General: Skin is warm and dry.  Neurological:     Mental Status: He is alert and oriented to person, place, and time.     (all labs ordered are listed, but only abnormal results are displayed) Labs Reviewed  CBC WITH DIFFERENTIAL/PLATELET - Abnormal; Notable for the following components:      Result Value   Hemoglobin 7.5 (*)    HCT 27.5 (*)    MCV 61.5 (*)    MCH 16.8 (*)    MCHC 27.3 (*)    RDW 26.9 (*)    Platelets 432 (*)    All other components within normal limits  BASIC METABOLIC PANEL WITH GFR - Abnormal; Notable for  the following components:   Glucose, Bld 108 (*)    Calcium 8.8 (*)    All other components within normal limits  LIPASE, BLOOD  TYPE AND SCREEN  TROPONIN I (HIGH SENSITIVITY)  TROPONIN I (HIGH SENSITIVITY)    EKG: None  Radiology: DG Chest 2 View Result Date: 06/24/2024 CLINICAL DATA:  chest pain EXAM: CHEST - 2 VIEW COMPARISON:  Chest x-ray 06/12/2024 FINDINGS: The heart and mediastinal contours are within normal limits. No focal consolidation. No pulmonary edema. No pleural effusion. No pneumothorax. No acute osseous abnormality. IMPRESSION: No active cardiopulmonary disease. Electronically Signed   By: Morgane  Naveau M.D.   On: 06/24/2024 17:56     Procedures   Medications Ordered in the ED - No data to display                                   Medical Decision Making This patient presents to the ED for concern of vomiting, anemia, this involves an extensive number of treatment options, and is a complaint that carries with it a high risk of complications and morbidity.  The differential diagnosis includes peptic ulcer disease, gastritis, symptomatic anemia secondary to blood loss, gastroenteritis.   Co morbidities that complicate the patient evaluation  Recent diagnosis of H. pylori   Additional history obtained:  Additional history obtained from record review External records from outside source obtained and reviewed including recent hospital discharge summary   Lab Tests:  I Ordered, and personally interpreted labs.  The pertinent results include: CBC notable for anemia with hemoglobin of 7.5, was 8.1 at time of hospital discharge.  BMP unremarkable.  Troponin of 5, repeat 4, low suspicion for ACS.  Lipase within normal limits.   Imaging Studies ordered:  I ordered imaging studies including CXR  I independently visualized and interpreted imaging which showed No active cardiopulmonary disease.  I agree with the radiologist interpretation   Cardiac Monitoring: / EKG:  The patient was maintained on a cardiac monitor.  I personally viewed and interpreted the cardiac monitored which showed an underlying rhythm of: NSR   Consultations Obtained:  I requested consultation with the hospitalist,  and discussed lab and imaging findings as well as pertinent plan - they recommend: spoke with Dr. Jackee, she recommends admission for serial hgb, management of symptomatic anemia, further discussion regarding patient's medications for H. pylori infection.   Problem List / ED Course / Critical interventions / Medication management I have reviewed the patients home medicines and have made adjustments as needed   Social Determinants of Health:  Tobacco use   Test / Admission -  Considered:  Physical exam notable as above.  Labs are largely reassuring as above, I do not feel that patient's chest pain/shortness of breath is associated with any cardiac or pulmonary cause, after further discussion with him it sounds like the symptoms only coincide with his vomiting, not currently experiencing chest pain or shortness of breath therefore I have low suspicion for ACS.  Based on the patient's recent hospital discharge summary note, it appears he was supposed to be taking combination medication Pylera for H. pylori or tetracycline /Flagyl /PPI/Pepto-Bismol, specific directions were unclear but patient does admit he has not taken any PPI or Pepto-Bismol in several days, states that he was not given any antibiotics and has not been taking these.  Patient found to be anemic today with hemoglobin of 7.5, given that he was recently hospitalized  requiring blood transfusions for symptomatic anemia and found to have bleeding peptic ulcers/H. pylori, I do feel that he would benefit from hospital admission for serial hemoglobin monitoring and possible repeat transfusion.  See above for hospitalist recommendations.    Amount and/or Complexity of Data Reviewed Labs: ordered.  Risk Prescription drug management. Decision regarding hospitalization.        Final diagnoses:  Anemia, unspecified type    ED Discharge Orders     None          Glendia Rocky LOISE DEVONNA 06/24/24 2252    Patsey Lot, MD 06/25/24 1446

## 2024-06-25 ENCOUNTER — Other Ambulatory Visit (HOSPITAL_COMMUNITY): Payer: Self-pay

## 2024-06-25 DIAGNOSIS — D649 Anemia, unspecified: Secondary | ICD-10-CM | POA: Diagnosis present

## 2024-06-25 DIAGNOSIS — D62 Acute posthemorrhagic anemia: Secondary | ICD-10-CM | POA: Diagnosis not present

## 2024-06-25 LAB — VITAMIN B12: Vitamin B-12: 249 pg/mL (ref 180–914)

## 2024-06-25 LAB — HEMOGLOBIN AND HEMATOCRIT, BLOOD
HCT: 27.1 % — ABNORMAL LOW (ref 39.0–52.0)
HCT: 27.2 % — ABNORMAL LOW (ref 39.0–52.0)
Hemoglobin: 7.5 g/dL — ABNORMAL LOW (ref 13.0–17.0)
Hemoglobin: 7.6 g/dL — ABNORMAL LOW (ref 13.0–17.0)

## 2024-06-25 LAB — IRON AND TIBC
Iron: 15 ug/dL — ABNORMAL LOW (ref 45–182)
Saturation Ratios: 4 % — ABNORMAL LOW (ref 17.9–39.5)
TIBC: 364 ug/dL (ref 250–450)
UIBC: 349 ug/dL

## 2024-06-25 LAB — PREPARE RBC (CROSSMATCH)

## 2024-06-25 LAB — FOLATE: Folate: 15.2 ng/mL (ref >5.9)

## 2024-06-25 MED ORDER — BISMUTH SUBSALICYLATE 262 MG PO CHEW
524.0000 mg | CHEWABLE_TABLET | Freq: Four times a day (QID) | ORAL | 0 refills | Status: AC
  Filled 2024-06-25: qty 120, 15d supply, fill #0

## 2024-06-25 MED ORDER — PROCHLORPERAZINE EDISYLATE 10 MG/2ML IJ SOLN
5.0000 mg | Freq: Once | INTRAMUSCULAR | Status: AC | PRN
Start: 2024-06-25 — End: 2024-06-25
  Administered 2024-06-25: 5 mg via INTRAVENOUS
  Filled 2024-06-25: qty 2

## 2024-06-25 MED ORDER — ESOMEPRAZOLE MAGNESIUM 20 MG PO CPDR
20.0000 mg | DELAYED_RELEASE_CAPSULE | Freq: Two times a day (BID) | ORAL | 0 refills | Status: AC
Start: 2024-06-25 — End: 2024-07-10
  Filled 2024-06-25: qty 28, 14d supply, fill #0

## 2024-06-25 MED ORDER — LIDOCAINE VISCOUS HCL 2 % MT SOLN
15.0000 mL | Freq: Once | OROMUCOSAL | Status: AC
  Administered 2024-06-25: 15 mL via ORAL
  Filled 2024-06-25: qty 15

## 2024-06-25 MED ORDER — TRAMADOL HCL 50 MG PO TABS
50.0000 mg | ORAL_TABLET | Freq: Once | ORAL | Status: AC
  Administered 2024-06-25: 50 mg via ORAL
  Filled 2024-06-25: qty 1

## 2024-06-25 MED ORDER — SODIUM CHLORIDE 0.9% IV SOLUTION: Freq: Once | INTRAVENOUS | Status: AC

## 2024-06-25 MED ORDER — ALUM & MAG HYDROXIDE-SIMETH 200-200-20 MG/5ML PO SUSP
30.0000 mL | Freq: Once | ORAL | Status: AC
  Administered 2024-06-25: 30 mL via ORAL
  Filled 2024-06-25: qty 30

## 2024-06-25 MED ORDER — FUROSEMIDE 10 MG/ML IJ SOLN
20.0000 mg | Freq: Once | INTRAMUSCULAR | Status: AC
  Administered 2024-06-25: 20 mg via INTRAVENOUS
  Filled 2024-06-25: qty 2

## 2024-06-25 MED ORDER — METRONIDAZOLE 500 MG PO TABS
500.0000 mg | ORAL_TABLET | Freq: Three times a day (TID) | ORAL | 0 refills | Status: AC
  Filled 2024-06-25: qty 42, 14d supply, fill #0

## 2024-06-25 MED ORDER — TETRACYCLINE HCL 500 MG PO CAPS
500.0000 mg | ORAL_CAPSULE | Freq: Four times a day (QID) | ORAL | 0 refills | Status: AC
  Filled 2024-06-25: qty 36, 9d supply, fill #0
  Filled 2024-06-25: qty 20, 5d supply, fill #0

## 2024-06-25 MED ORDER — MAALOX MAX 400-400-40 MG/5ML PO SUSP
10.0000 mL | Freq: Four times a day (QID) | ORAL | 0 refills | Status: AC | PRN
  Filled 2024-06-25: qty 355, 9d supply, fill #0

## 2024-06-25 MED ORDER — HYDROMORPHONE HCL 1 MG/ML IJ SOLN
1.0000 mg | INTRAMUSCULAR | Status: DC | PRN
  Administered 2024-06-25: 1 mg via INTRAVENOUS
  Filled 2024-06-25: qty 1

## 2024-06-25 NOTE — Progress Notes (Addendum)
 PROGRESS NOTE    Patient: William Horne                            PCP: Patient, No Pcp Per                    DOB: 10-24-1976            DOA: 06/24/2024 FMW:[id number]             DOS: 06/25/2024, 9:21 AM   LOS: 0 days   Date of Service: The patient was seen and examined on 06/25/2024  Subjective:   The patient was seen and examined this morning. Mildly bradycardic heart rate 46-53 Blood pressure soft 116/81 currently 122/85 Hemoglobin 7.5 Reporting of no active rectal bleed, or dark, bloody stool  Brief Narrative:    William Horne is a 48 y.o. male with medical history significant of recent upper GI bleed due to gastric ulcer caused by H. pylori hepatitis C infection and eczema who presents to the emergency department for recheck of his hemoglobin.   On 25 June he had an EGD which showed the gastric ulcer and later came back as H. pylori positive.   He was discharged from the hospital on June 27 with a medication regimen to treat the H. pylori including bismuth  subsalicylate, Nexium , metronidazole , and tetracycline .   Patient was under the impression that this medication was going to be delivered to him at the hospital when it did not, he left without it.  According to the after visit summary these medications were sent to the [facility].  Patient seemed to think it was going to be delivered to his hospital room.  Therefore since discharge he has not been taking any of these medications.   He presents today feeling weak and not so well.  His hemoglobin is down to 7.5. Hospital medicine was asked to monitor the patient's H&H and if he dropped to less than 7 to arrange for transfusion.   Patient to be placed in hospital observation status.  Assessment and Plan: * Acute blood loss anemia -  Patient with persistent acute blood loss anemia likely related to miscommunication regarding his medications. -Resume previous medications to include bismuth  subsalicylate, Nexium ,  metronidazole  and tetracycline . -Check H&H every 4 hours x 4 if hemoglobin stable patient can discharge home with education such that he knows how to and where to pick up his medications. -If hemoglobin drops to below 7 he will need a transfusion of packed red blood cells.  Type and screen has been sent.   H pylori ulcer As noted above.   Gastric ulcer, unspecified as acute or chronic, without hemorrhage or perforation Patient with gastric ulcer due to H. pylori.  Resume treatment as above.    Assessment & Plan:   Principal Problem:   Acute blood loss anemia Active Problems:   Gastric ulcer, unspecified as acute or chronic, without hemorrhage or perforation   H pylori ulcer     Assessment and Plan: * Acute blood loss anemia-symptomatic anemia -Patient with persistent acute blood loss anemia likely related  To ongoing gastritis due to H. pylori infection status post EGD on last admission -Miscommunication did not receive med regimen -Resume previous medications to include bismuth  subsalicylate, Nexium , metronidazole  and tetracycline .     Latest Ref Rng & Units 06/25/2024   12:22 AM 06/24/2024   10:49 PM 06/24/2024    5:36 PM  CBC  WBC 4.0 - 10.5 K/uL   9.8   Hemoglobin 13.0 - 17.0 g/dL 7.5  7.3  7.5   Hematocrit 39.0 - 52.0 % 27.2  26.5  27.5   Platelets 150 - 400 K/uL   432     -Check H&H every 4 hours x 4 if hemoglobin  - Patient is complaining of dizziness,, noted for bradycardia, heart rate as low as 46, soft BP as low as 116/81,  -Due to patient being symptomatic, offered blood transfusion  -Discussed the pros and cons of blood transfusion, agreed to proceed with  2 U PRBC transfusion today 06/25/2024   stable patient can discharge home with education such that he knows how to and where to pick up his medications.   H pylori ulcer with abdominal pain -Continue:bismuth  subsalicylate, Nexium , metronidazole  and tetracycline . - As needed analgesics, Maalox   Gastric  ulcer, unspecified as acute or chronic, without hemorrhage or perforation Patient with gastric ulcer due to H. pylori.  Resume treatment as above.            ----------------------------------------------------------------------------------------------------------- Nutritional status:  The patient's BMI is: Body mass index is 21.47 kg/m. I agree with the assessment and plan as outlined   -----------------------------------------------------------------------------------------------------------  DVT prophylaxis:  SCDs   Code Status:   Code Status: Full Code  Family Communication: No family member present at bedside-  -Advance care planning has been discussed.   Admission status:   Status is: Observation The patient remains OBS appropriate and will d/c before 2 midnights.   Disposition: From  - home             Planning for discharge in 1-2 days: to   Procedures:   No admission procedures for hospital encounter.   Antimicrobials:  Anti-infectives (From admission, onward)    Start     Dose/Rate Route Frequency Ordered Stop   06/24/24 2215  metroNIDAZOLE  (FLAGYL ) tablet 500 mg        500 mg Oral 3 times daily with meals 06/24/24 2130     06/24/24 2215  tetracycline  (SUMYCIN ) capsule 500 mg        500 mg Oral 4 times daily 06/24/24 2130          Medication:   sodium chloride    Intravenous Once   bismuth  subsalicylate  524 mg Oral QID   furosemide   20 mg Intravenous Once   metroNIDAZOLE   500 mg Oral TID WC   nicotine   21 mg Transdermal Daily   pantoprazole   40 mg Oral BID   tetracycline   500 mg Oral QID    acetaminophen  **OR** acetaminophen , hydrALAZINE , ondansetron  **OR** ondansetron  (ZOFRAN ) IV, traZODone    Objective:   Vitals:   06/25/24 0015 06/25/24 0023 06/25/24 0300 06/25/24 0838  BP: 127/86  116/81 122/85  Pulse: 67  62 (!) 53  Resp: 18  18 17   Temp: 98.6 F (37 C)  98.3 F (36.8 C) 98.1 F (36.7 C)  TempSrc: Oral  Oral   SpO2: 100%   98% 100%  Weight:  80 kg    Height:  6' 4 (1.93 m)     No intake or output data in the 24 hours ending 06/25/24 0921 Filed Weights   06/24/24 1732 06/25/24 0023  Weight: 76.2 kg 80 kg     Physical examination:   Constitution:  Alert, cooperative, no distress,  Appears calm and comfortable  Psychiatric:   Normal and stable mood and affect, cognition intact,   HEENT:  Normocephalic, PERRL, otherwise with in Normal limits  Chest:         Chest symmetric Cardio vascular:  S1/S2, RRR, No murmure, No Rubs or Gallops  pulmonary: Clear to auscultation bilaterally, respirations unlabored, negative wheezes / crackles Abdomen: Soft, non-tender, non-distended, bowel sounds,no masses, no organomegaly Muscular skeletal: Global generalized weakness  Limited exam - in bed, able to move all 4 extremities,   Neuro: CNII-XII intact. , normal motor and sensation, reflexes intact  Extremities: No pitting edema lower extremities, +2 pulses  Skin: Dry, warm to touch, negative for any Rashes, No open wounds Wounds: per nursing documentation   ------------------------------------------------------------------------------------------------------------------------------------------    LABs:     Latest Ref Rng & Units 06/25/2024   12:22 AM 06/24/2024   10:49 PM 06/24/2024    5:36 PM  CBC  WBC 4.0 - 10.5 K/uL   9.8   Hemoglobin 13.0 - 17.0 g/dL 7.5  7.3  7.5   Hematocrit 39.0 - 52.0 % 27.2  26.5  27.5   Platelets 150 - 400 K/uL   432       Latest Ref Rng & Units 06/24/2024    5:36 PM 06/15/2024    4:08 AM 06/14/2024    7:55 AM  CMP  Glucose 70 - 99 mg/dL 891  82  84   BUN 6 - 20 mg/dL 7  10  12    Creatinine 0.61 - 1.24 mg/dL 9.00  9.03  8.97   Sodium 135 - 145 mmol/L 139  138  135   Potassium 3.5 - 5.1 mmol/L 3.5  4.6  4.4   Chloride 98 - 111 mmol/L 103  107  104   CO2 22 - 32 mmol/L 29  23  24    Calcium 8.9 - 10.3 mg/dL 8.8  8.8  8.6        Micro Results No results found for this or  any previous visit (from the past 240 hours).  Radiology Reports DG Chest 2 View Result Date: 06/24/2024 CLINICAL DATA:  chest pain EXAM: CHEST - 2 VIEW COMPARISON:  Chest x-ray 06/12/2024 FINDINGS: The heart and mediastinal contours are within normal limits. No focal consolidation. No pulmonary edema. No pleural effusion. No pneumothorax. No acute osseous abnormality. IMPRESSION: No active cardiopulmonary disease. Electronically Signed   By: Morgane  Naveau M.D.   On: 06/24/2024 17:56    SIGNED: Adriana DELENA Grams, MD, FHM. FAAFP. Jolynn Pack - [facility] Time spent - 55 min.  In seeing, evaluating and examining the patient. Reviewing medical records, labs, drawn plan of care. [facility],  Pager (please use [email or web address] to page/ text) Please use Epic Secure Chat for non-urgent communication (7AM-7PM)  If 7PM-7AM, please contact night-coverage [email or web address], 06/25/2024, 9:21 AM

## 2024-06-25 NOTE — Hospital Course (Addendum)
 William Horne is a 48 y.o. male with medical history significant of recent upper GI bleed due to gastric ulcer caused by H. pylori hepatitis C infection and eczema who presents to the emergency department for recheck of his hemoglobin.   On 25 June he had an EGD which showed the gastric ulcer and later came back as H. pylori positive.   He was discharged from the hospital on June 27 with a medication regimen to treat the H. pylori including bismuth  subsalicylate, Nexium , metronidazole , and tetracycline .   Patient was under the impression that this medication was going to be delivered to him at the hospital when it did not, he left without it.  According to the after visit summary these medications were sent to the [facility].  Patient seemed to think it was going to be delivered to his hospital room.  Therefore since discharge he has not been taking any of these medications.   He presents today feeling weak and not so well.  His hemoglobin is down to 7.5. Hospital medicine was asked to monitor the patient's H&H and if he dropped to less than 7 to arrange for transfusion.   Patient to be placed in hospital observation status.  Assessment and Plan: * Acute blood loss anemia -  Patient with persistent acute blood loss anemia likely related to miscommunication regarding his medications. -Resume previous medications to include bismuth  subsalicylate, Nexium , metronidazole  and tetracycline . -Check H&H every 4 hours x 4 if hemoglobin stable patient can discharge home with education such that he knows how to and where to pick up his medications. -If hemoglobin drops to below 7 he will need a transfusion of packed red blood cells.  Type and screen has been sent.   H pylori ulcer As noted above.   Gastric ulcer, unspecified as acute or chronic, without hemorrhage or perforation Patient with gastric ulcer due to H. pylori.  Resume treatment as above.

## 2024-06-25 NOTE — Plan of Care (Signed)

## 2024-06-25 NOTE — Plan of Care (Signed)
  Problem: Education: Goal: Knowledge of General Education information will improve Description: Including pain rating scale, medication(s)/side effects and non-pharmacologic comfort measures Outcome: Progressing   Problem: Clinical Measurements: Goal: Ability to maintain clinical measurements within normal limits will improve Outcome: Progressing   Problem: Coping: Goal: Level of anxiety will decrease Outcome: Progressing   

## 2024-06-25 NOTE — TOC Initial Note (Addendum)
 Transition of Care [facility]) - Initial/Assessment Note    Patient Details  Name: William Horne MRN: [id number] Date of Birth: November 08, 1976  [facility] CM/SW Contact:    Doneta Glenys DASEN, RN Phone Number: 06/25/2024, 3:27 PM  Clinical Narrative:                 CM spoke with patient in the room. PTA states lives in a house with his father. Pt agreeable to allowing CM to schedule a PCP appointment. Verified insurance. Denies DME, HH, oxygen or SDOH needs. Patient states his father will transport home at dis discharge. TOC will follow. 3:46 PM PCP appointment scheduled and on AVS.  Expected Discharge Plan: Home/Self Care Barriers to Discharge: Continued Medical Work up   Patient Goals and CMS Choice Patient states their goals for this hospitalization and ongoing recovery are:: home CMS Medicare.gov Compare Post Acute Care list provided to::  (NA) Choice offered to / list presented to : NA [employer] ownership interest in [facility].provided to:: Parent NA    Expected Discharge Plan and Services In-house Referral: NA Discharge Planning Services: CM Consult Post Acute Care Choice: NA Living arrangements for the past 2 months: Single Family Home                 DME Arranged: N/A DME Agency: NA       HH Arranged: NA HH Agency: NA        Prior Living Arrangements/Services Living arrangements for the past 2 months: Single Family Home Lives with:: Parents Patient language and need for interpreter reviewed:: Yes Do you feel safe going back to the place where you live?: Yes      Need for Family Participation in Patient Care: No (Comment) Care giver support system in place?: Yes (comment) Current home services:  (NA) Criminal Activity/Legal Involvement Pertinent to Current Situation/Hospitalization: No - Comment as needed  Activities of Daily Living   ADL Screening (condition at time of admission) Independently performs ADLs?: Yes (appropriate for  developmental age) Is the patient deaf or have difficulty hearing?: No Does the patient have difficulty seeing, even when wearing glasses/contacts?: No Does the patient have difficulty concentrating, remembering, or making decisions?: No  Permission Sought/Granted Permission sought to share information with : Case Manager Permission granted to share information with : Yes, Verbal Permission Granted     Permission granted to share info w AGENCY: [place] [facility]        Emotional Assessment Appearance:: Appears younger than stated age Attitude/Demeanor/Rapport: Engaged Affect (typically observed): Appropriate Orientation: : Oriented to Self, Oriented to Place, Oriented to  Time, Oriented to Situation Alcohol / Substance Use: Not Applicable Psych Involvement: No (comment)  Admission diagnosis:  Acute blood loss anemia [D62] Anemia, unspecified type [D64.9] Symptomatic anemia [D64.9] Patient Active Problem List   Diagnosis Date Noted   Symptomatic anemia 06/25/2024   Acute blood loss anemia 06/24/2024   H pylori ulcer 06/24/2024   Anemia 06/13/2024   Upper GI bleed 06/12/2024   Unresponsive state 10/31/2020   Gastric ulcer, unspecified as acute or chronic, without hemorrhage or perforation 07/17/2019   Tobacco use 07/17/2019   Marijuana use 07/17/2019   Nausea and vomiting 07/17/2019   Microcytic anemia 07/17/2019   PCP:  Patient, No Pcp Per Pharmacy:   [id number] [place], [place] - [address] AT [facility] & [address] [place] [address] Phone: [phone]  Fax: [phone]  [facility] [address] [place] [address] Phone: [phone] Fax: [phone]     Social Drivers of Health (SDOH) Social History: SDOH Screenings   Food Insecurity: No Food Insecurity (06/25/2024)  Housing: Low Risk  (06/25/2024)  Transportation Needs: No Transportation Needs (06/25/2024)   Utilities: Not At Risk (06/25/2024)  Tobacco Use: High Risk (06/24/2024)   SDOH Interventions:     Readmission Risk Interventions     No data to display

## 2024-06-26 ENCOUNTER — Other Ambulatory Visit (HOSPITAL_COMMUNITY): Payer: Self-pay

## 2024-06-26 DIAGNOSIS — D62 Acute posthemorrhagic anemia: Secondary | ICD-10-CM | POA: Diagnosis not present

## 2024-06-26 LAB — CBC WITH DIFFERENTIAL/PLATELET
Abs Immature Granulocytes: 0.02 K/uL (ref 0.00–0.07)
Basophils Absolute: 0 K/uL (ref 0.0–0.1)
Basophils Relative: 0 %
Eosinophils Absolute: 0.1 K/uL (ref 0.0–0.5)
Eosinophils Relative: 2 %
HCT: 34.6 % — ABNORMAL LOW (ref 39.0–52.0)
Hemoglobin: 10 g/dL — ABNORMAL LOW (ref 13.0–17.0)
Immature Granulocytes: 0 %
Lymphocytes Relative: 21 %
Lymphs Abs: 1.9 K/uL (ref 0.7–4.0)
MCH: 18.5 pg — ABNORMAL LOW (ref 26.0–34.0)
MCHC: 28.9 g/dL — ABNORMAL LOW (ref 30.0–36.0)
MCV: 64 fL — ABNORMAL LOW (ref 80.0–100.0)
Monocytes Absolute: 0.9 K/uL (ref 0.1–1.0)
Monocytes Relative: 10 %
Neutro Abs: 5.9 K/uL (ref 1.7–7.7)
Neutrophils Relative %: 67 %
Platelets: 425 K/uL — ABNORMAL HIGH (ref 150–400)
RBC: 5.41 MIL/uL (ref 4.22–5.81)
RDW: 30.4 % — ABNORMAL HIGH (ref 11.5–15.5)
WBC: 8.9 K/uL (ref 4.0–10.5)
nRBC: 0 % (ref 0.0–0.2)

## 2024-06-26 LAB — BPAM RBC
Blood Product Expiration Date: 202508052359
Blood Product Expiration Date: 202508092359
ISSUE DATE / TIME: 202507071037
ISSUE DATE / TIME: 202507071344
Unit Type and Rh: 5100
Unit Type and Rh: 5100

## 2024-06-26 LAB — TYPE AND SCREEN
ABO/RH(D): O POS
Antibody Screen: NEGATIVE
Unit division: 0
Unit division: 0

## 2024-06-26 MED ORDER — FERROUS SULFATE 325 (65 FE) MG PO TABS
325.0000 mg | ORAL_TABLET | Freq: Two times a day (BID) | ORAL | 0 refills | Status: AC
  Filled 2024-06-26 (×2): qty 180, 90d supply, fill #0

## 2024-06-26 MED ORDER — NICOTINE 21 MG/24HR TD PT24
21.0000 mg | MEDICATED_PATCH | Freq: Every day | TRANSDERMAL | 0 refills | Status: AC
  Filled 2024-06-26: qty 28, 28d supply, fill #0

## 2024-06-26 MED ORDER — IRON SUCROSE 200 MG IVPB - SIMPLE MED
200.0000 mg | Freq: Once | Status: AC
  Administered 2024-06-26: 200 mg via INTRAVENOUS
  Filled 2024-06-26: qty 200

## 2024-06-26 MED ORDER — POLYSACCHARIDE IRON COMPLEX 150 MG PO CAPS
150.0000 mg | ORAL_CAPSULE | Freq: Once | ORAL | Status: AC
  Administered 2024-06-26: 150 mg via ORAL
  Filled 2024-06-26: qty 1

## 2024-06-26 MED ORDER — FERROUS SULFATE 325 (65 FE) MG PO TABS: 325.0000 mg | ORAL_TABLET | Freq: Every day | ORAL | Status: DC

## 2024-06-26 NOTE — Progress Notes (Signed)
 AVS reviewed w/ pt - additional info given to pt about an iron  rich diet. Iron  infusing. TOC meds in a secure bag delivered to pt in room by this RN

## 2024-06-26 NOTE — Discharge Summary (Addendum)
 Physician Discharge Summary   Patient: William Horne MRN: [id number] DOB: 1976/01/19  Admit date:     06/24/2024  Discharge date: 06/26/24  Discharge Physician: Adriana DELENA Grams   PCP: Patient, No Pcp Per   Recommendations at discharge:   Follow-up with gastroenterologist in 1 week CBC 5-7, up to PCP and gastroenterologist Follow-up with PCP in 1-2 weeks Continue current medications-treatment regimen for H. pylori ASAP  Discharge Diagnoses: Principal Problem:   Acute blood loss anemia Active Problems:   Gastric ulcer, unspecified as acute or chronic, without hemorrhage or perforation   H pylori ulcer   Symptomatic anemia  Resolved Problems:   * No resolved hospital problems. *  Hospital Course:  William Horne is a 48 y.o. male with medical history significant of recent upper GI bleed due to gastric ulcer caused by H. pylori hepatitis C infection and eczema who presents to the emergency department for recheck of his hemoglobin.   On 25 June he had an EGD which showed the gastric ulcer and later came back as H. pylori positive.   He was discharged from the hospital on June 27 with a medication regimen to treat the H. pylori including bismuth  subsalicylate, Nexium , metronidazole , and tetracycline .   Patient was under the impression that this medication was going to be delivered to him at the hospital when it did not, he left without it.  According to the after visit summary these medications were sent to the [facility].  Patient seemed to think it was going to be delivered to his hospital room.  Therefore since discharge he has not been taking any of these medications.   He presents today feeling weak and not so well.  His hemoglobin is down to 7.5. Hospital medicine was asked to monitor the patient's H&H and if he dropped to less than 7 to arrange for transfusion.   Patient to be placed in hospital observation status.  * Acute blood loss anemia-symptomatic  anemia-with severe iron  deficiency anemia -Patient with persistent acute blood loss anemia likely related  To ongoing gastritis due to H. pylori infection status post EGD on last admission -Miscommunication did not receive med regimen -Resume previous medications to include Bismuth  subsalicylate, Nexium , metronidazole  and tetracycline .  -Check H&H every 4 hours x 4 if hemoglobin  - Patient is complaining of dizziness,, noted for bradycardia, heart rate as low as 46, soft BP as low as 116/81,   -Due to patient being symptomatic, offered blood transfusion   -Discussed the pros and cons of blood transfusion, agreed to proceed with  2 U PRBC transfusion today 06/25/2024 06/26/2024, IV iron -oral iron  supplement initiated      Latest Ref Rng & Units 06/26/2024    9:24 AM 06/25/2024   10:37 AM 06/25/2024   12:22 AM  CBC  WBC 4.0 - 10.5 K/uL 8.9     Hemoglobin 13.0 - 17.0 g/dL 89.9  7.6  7.5   Hematocrit 39.0 - 52.0 % 34.6  27.1  27.2   Platelets 150 - 400 K/uL 425           H pylori ulcer with abdominal pain -Continue:bismuth  subsalicylate, Nexium , metronidazole  and tetracycline . - As needed analgesics, Maalox     Gastric ulcer, unspecified as acute or chronic, without hemorrhage or perforation Patient with gastric ulcer due to H. pylori.  Resume treatment as above.    ----------------------------------------------------------------------------------------------------------- Nutritional status:  The patient's BMI is: Body mass index is 21.47 kg/m. I agree with the  assessment and plan as outlined    Disposition: Home Diet recommendation:  Discharge Diet Orders (From admission, onward)     Start     Ordered   06/26/24 0000  Diet - low sodium heart healthy        06/26/24 0832           Regular diet DISCHARGE MEDICATION: Allergies as of 06/26/2024       Reactions   Asa [aspirin] Nausea And Vomiting        Medication List     TAKE these medications    acetaminophen   500 MG tablet Commonly known as: TYLENOL  Take 500 mg by mouth every 6 (six) hours as needed for moderate pain (pain score 4-6).   bismuth  subsalicylate 262 MG chewable tablet Commonly known as: PEPTO BISMOL Chew 2 tablets (524 mg total) by mouth 4 (four) times daily.   esomeprazole  20 MG capsule Commonly known as: NEXIUM  Take 1 capsule (20 mg total) by mouth 2 (two) times daily before a meal for 14 days.   ferrous sulfate  325 (65 FE) MG tablet Take 1 tablet (325 mg total) by mouth 2 (two) times daily with a meal.   Maalox Max 400-400-40 MG/5ML suspension Generic drug: alum & mag hydroxide-simeth Take 10 mLs by mouth every 6 (six) hours as needed for indigestion.   metroNIDAZOLE  500 MG tablet Commonly known as: FLAGYL  Take 1 tablet (500 mg total) by mouth 3 (three) times daily with meals for 14 days.   nicotine  21 mg/24hr patch Commonly known as: NICODERM CQ  - dosed in mg/24 hours Place 1 patch (21 mg total) onto the skin daily.   tetracycline  500 MG capsule Commonly known as: SUMYCIN  Take 1 capsule (500 mg total) by mouth 4 (four) times daily for 14 days.        Discharge Exam: Filed Weights   06/24/24 1732 06/25/24 0023  Weight: 76.2 kg 80 kg        General:  AAO x 3,  cooperative, no distress;   HEENT:  Normocephalic, PERRL, otherwise with in Normal limits   Neuro:  CNII-XII intact. , normal motor and sensation, reflexes intact   Lungs:   Clear to auscultation BL, Respirations unlabored,  No wheezes / crackles  Cardio:    S1/S2, RRR, No murmure, No Rubs or Gallops   Abdomen:  Soft, non-tender, bowel sounds active all four quadrants, no guarding or peritoneal signs.  Muscular  skeletal:  Limited exam -global generalized weaknesses - in bed, able to move all 4 extremities,   2+ pulses,  symmetric, No pitting edema  Skin:  Dry, warm to touch, negative for any Rashes,  Wounds: Please see nursing documentation          Condition at discharge: good  The  results of significant diagnostics from this hospitalization (including imaging, microbiology, ancillary and laboratory) are listed below for reference.   Imaging Studies: DG Chest 2 View Result Date: 06/24/2024 CLINICAL DATA:  chest pain EXAM: CHEST - 2 VIEW COMPARISON:  Chest x-ray 06/12/2024 FINDINGS: The heart and mediastinal contours are within normal limits. No focal consolidation. No pulmonary edema. No pleural effusion. No pneumothorax. No acute osseous abnormality. IMPRESSION: No active cardiopulmonary disease. Electronically Signed   By: Morgane  Naveau M.D.   On: 06/24/2024 17:56   CT ABDOMEN PELVIS W CONTRAST Result Date: 06/14/2024 CLINICAL DATA:  Acute abdominal pain.  Chronic hepatitis C. EXAM: CT ABDOMEN AND PELVIS WITH CONTRAST TECHNIQUE: Multidetector CT imaging of the abdomen and  pelvis was performed using the standard protocol following bolus administration of intravenous contrast. RADIATION DOSE REDUCTION: This exam was performed according to the departmental dose-optimization program which includes automated exposure control, adjustment of the mA and/or kV according to patient size and/or use of iterative reconstruction technique. CONTRAST:  OMNIPAQUE  IOHEXOL  300 MG/ML  SOLN COMPARISON:  07/01/2022 FINDINGS: Lower Chest: No acute findings. Hepatobiliary: No suspicious hepatic masses identified. No gross morphologic signs of cirrhosis noted. Gallbladder is unremarkable. No evidence of biliary ductal dilatation. Pancreas:  No mass or inflammatory changes. Spleen: Within normal limits in size and appearance. Adrenals/Urinary Tract: No suspicious masses identified. No evidence of ureteral calculi or hydronephrosis. Stomach/Bowel: No evidence of obstruction, inflammatory process or abnormal fluid collections. Vascular/Lymphatic: No pathologically enlarged lymph nodes. No acute vascular findings. Reproductive:  No mass or other significant abnormality. Other:  None. Musculoskeletal: No  suspicious bone lesions identified. Moderate degenerative disc disease incidentally noted at L5-S1. IMPRESSION: No acute findings or other significant abnormality. Electronically Signed   By: Norleen DELENA Kil M.D.   On: 06/14/2024 09:32   DG Chest 2 View Result Date: 06/12/2024 CLINICAL DATA:  Chest pain. EXAM: CHEST - 2 VIEW COMPARISON:  July 29, 2023. FINDINGS: The heart size and mediastinal contours are within normal limits. Both lungs are clear. The visualized skeletal structures are unremarkable. IMPRESSION: No active cardiopulmonary disease. Electronically Signed   By: Lynwood Landy Raddle M.D.   On: 06/12/2024 08:12    Microbiology: Results for orders placed or performed during the hospital encounter of 10/31/20  Respiratory Panel by RT PCR (Flu A&B, Covid) -     Status: None   Collection Time: 10/31/20  6:02 AM   Specimen: Nasopharyngeal  Result Value Ref Range Status   SARS Coronavirus 2 by RT PCR NEGATIVE NEGATIVE Final    Comment: (NOTE) SARS-CoV-2 target nucleic acids are NOT DETECTED.  The SARS-CoV-2 RNA is generally detectable in upper respiratoy specimens during the acute phase of infection. The lowest concentration of SARS-CoV-2 viral copies this assay can detect is 131 copies/mL. A negative result does not preclude SARS-Cov-2 infection and should not be used as the sole basis for treatment or other patient management decisions. A negative result may occur with  improper specimen collection/handling, submission of specimen other than nasopharyngeal swab, presence of viral mutation(s) within the areas targeted by this assay, and inadequate number of viral copies (<131 copies/mL). A negative result must be combined with clinical observations, patient history, and epidemiological information. The expected result is Negative.  Fact Sheet for Patients:  https://www.moore.com/  Fact Sheet for Healthcare Providers:   https://www.young.biz/  This test is no t yet approved or cleared by the [place]  FDA and  has been authorized for detection and/or diagnosis of SARS-CoV-2 by FDA under an Emergency Use Authorization (EUA). This EUA will remain  in effect (meaning this test can be used) for the duration of the COVID-19 declaration under Section 564(b)(1) of the Act, 21 U.S.C. section 360bbb-3(b)(1), unless the authorization is terminated or revoked sooner.     Influenza A by PCR NEGATIVE NEGATIVE Final   Influenza B by PCR NEGATIVE NEGATIVE Final    Comment: (NOTE) The Xpert Xpress SARS-CoV-2/FLU/RSV assay is intended as an aid in  the diagnosis of influenza from Nasopharyngeal swab specimens and  should not be used as a sole basis for treatment. Nasal washings and  aspirates are unacceptable for Xpert Xpress SARS-CoV-2/FLU/RSV  testing.  Fact Sheet for Patients: https://www.moore.com/  Fact Sheet for  Healthcare Providers: https://www.young.biz/  This test is not yet approved or cleared by the [place]  FDA and  has been authorized for detection and/or diagnosis of SARS-CoV-2 by  FDA under an Emergency Use Authorization (EUA). This EUA will remain  in effect (meaning this test can be used) for the duration of the  Covid-19 declaration under Section 564(b)(1) of the Act, 21  U.S.C. section 360bbb-3(b)(1), unless the authorization is  terminated or revoked. Performed at [facility], [address] [place], [place] [address]   Culture, respiratory (tracheal aspirate)     Status: None   Collection Time: 10/31/20  7:01 AM   Specimen: Tracheal Aspirate; Respiratory  Result Value Ref Range Status   Specimen Description   Final    TRACHEAL ASPIRATE Performed at [facility], [address], [place], [place] [address]    Special Requests   Final    NONE Performed at [facility], [address], [place], [place] [address]    Gram Stain   Final    RARE WBC PRESENT, PREDOMINANTLY MONONUCLEAR FEW GRAM VARIABLE ROD    Culture   Final    FEW Normal respiratory flora-no Staph aureus or Pseudomonas seen Performed at [facility], [address], [place], [place] [address]    Report Status 11/02/2020 FINAL  Final  Culture, blood (routine x 2)     Status: None   Collection Time: 10/31/20  7:56 AM   Specimen: BLOOD  Result Value Ref Range Status   Specimen Description   Final    BLOOD LEFT WRIST Performed at [facility], [address] [place], [place] [address]    Special Requests   Final    BOTTLES DRAWN AEROBIC AND ANAEROBIC Blood Culture results may not be optimal due to an inadequate volume of blood received in culture bottles Performed at [facility], [address] [place], [place] [address]    Culture   Final    NO GROWTH 5 DAYS Performed at [facility], [address] [place], [place] [address]    Report Status 11/05/2020 FINAL  Final  Urine culture     Status: None   Collection Time: 10/31/20  7:56 AM   Specimen: Urine, Clean Catch  Result Value Ref Range Status   Specimen Description   Final    URINE, CLEAN CATCH Performed at [facility], [address] [place], [place] [address]    Special Requests   Final    NONE Performed at [facility], [address], [place], [place] [address]    Culture   Final    NO GROWTH Performed at [facility], [address] [place], [place] [address]    Report Status 11/01/2020 FINAL  Final  Culture, blood (routine x 2)     Status: None   Collection Time: 10/31/20  8:00 AM   Specimen: BLOOD  Result Value Ref Range Status   Specimen Description   Final    BLOOD RIGHT ANTECUBITAL Performed at [facility], [address] [place], [place] [address]    Special Requests   Final     BOTTLES DRAWN AEROBIC ONLY Blood Culture adequate volume Performed at [facility], [address] [place], [place] [address]    Culture   Final    NO GROWTH 5 DAYS Performed at [facility], [address],  [place], [place] [address]    Report Status 11/05/2020 FINAL  Final  MRSA PCR Screening     Status: None   Collection Time: 10/31/20  9:39 AM   Specimen: Nasal Mucosa; Nasopharyngeal  Result Value Ref Range Status   MRSA by PCR NEGATIVE NEGATIVE Final    Comment:        The GeneXpert MRSA Assay (FDA approved for NASAL specimens only), is one component of a comprehensive MRSA colonization surveillance program. It is not intended to diagnose MRSA infection nor to guide or monitor treatment for MRSA infections. Performed at [facility], [address] [place], [place] [address]     Labs: CBC: Recent Labs  Lab 06/24/24 1736 06/24/24 2249 06/25/24 0022 06/25/24 1037 06/26/24 0924  WBC 9.8  --   --   --  8.9  NEUTROABS 6.2  --   --   --  5.9  HGB 7.5* 7.3* 7.5* 7.6* 10.0*  HCT 27.5* 26.5* 27.2* 27.1* 34.6*  MCV 61.5*  --   --   --  64.0*  PLT 432*  --   --   --  425*   Basic Metabolic Panel: Recent Labs  Lab 06/24/24 1736  NA 139  K 3.5  CL 103  CO2 29  GLUCOSE 108*  BUN 7  CREATININE 0.99  CALCIUM 8.8*   Liver Function Tests: No results for input(s): AST, ALT, ALKPHOS, BILITOT, PROT, ALBUMIN in the last 168 hours. CBG: No results for input(s): GLUCAP in the last 168 hours.  Discharge time spent: greater than 40 minutes.  Signed: Adriana DELENA Grams, MD [facility] 06/26/2024

## 2024-06-26 NOTE — TOC Transition Note (Signed)
 Transition of Care [facility]) - Discharge Note   Patient Details  Name: William Horne MRN: [id number] Date of Birth: 1976-06-24  [facility] CM/SW Contact:  Doneta Glenys DASEN, RN Phone Number: 06/26/2024, 1:58 PM   Clinical Narrative:    No TOC need identified. TOC signing off   Final next level of care: Home/Self Care Barriers to Discharge: Barriers Resolved   Patient Goals and CMS Choice Patient states their goals for this hospitalization and ongoing recovery are:: home CMS Medicare.gov Compare Post Acute Care list provided to::  (NA) Choice offered to / list presented to : NA [employer] ownership interest in [facility].provided to:: Parent NA    Discharge Placement                       Discharge Plan and Services Additional resources added to the After Visit Summary for   In-house Referral: NA Discharge Planning Services: CM Consult Post Acute Care Choice: NA          DME Arranged: N/A DME Agency: NA       HH Arranged: NA HH Agency: NA        Social Drivers of Health (SDOH) Interventions SDOH Screenings   Food Insecurity: No Food Insecurity (06/25/2024)  Housing: Low Risk  (06/25/2024)  Transportation Needs: No Transportation Needs (06/25/2024)  Utilities: Not At Risk (06/25/2024)  Tobacco Use: High Risk (06/24/2024)     Readmission Risk Interventions     No data to display

## 2024-06-29 ENCOUNTER — Ambulatory Visit: Admitting: Family Medicine

## 2024-06-29 ENCOUNTER — Telehealth: Payer: Self-pay | Admitting: General Practice

## 2024-06-29 ENCOUNTER — Encounter: Payer: Self-pay | Admitting: General Practice

## 2024-06-29 NOTE — Telephone Encounter (Signed)
 Patients phone is not in service will send a NO show letter through mychart

## 2024-08-31 ENCOUNTER — Ambulatory Visit: Admitting: Family Medicine
